# Patient Record
Sex: Male | Born: 1980 | Race: White | Hispanic: No | Marital: Single | Smoking: Never smoker
Health system: Southern US, Academic
[De-identification: ages and names within clinical notes are randomized; demographics above are authoritative.]

## PROBLEM LIST (undated history)

## (undated) DIAGNOSIS — K5733 Diverticulitis of large intestine without perforation or abscess with bleeding: Secondary | ICD-10-CM

## (undated) DIAGNOSIS — F4310 Post-traumatic stress disorder, unspecified: Secondary | ICD-10-CM

## (undated) DIAGNOSIS — E162 Hypoglycemia, unspecified: Secondary | ICD-10-CM

## (undated) DIAGNOSIS — R945 Abnormal results of liver function studies: Secondary | ICD-10-CM

## (undated) DIAGNOSIS — K219 Gastro-esophageal reflux disease without esophagitis: Secondary | ICD-10-CM

## (undated) DIAGNOSIS — F1010 Alcohol abuse, uncomplicated: Secondary | ICD-10-CM

## (undated) DIAGNOSIS — Z8719 Personal history of other diseases of the digestive system: Secondary | ICD-10-CM

## (undated) DIAGNOSIS — R7989 Other specified abnormal findings of blood chemistry: Secondary | ICD-10-CM

## (undated) HISTORY — PX: TONSILLECTOMY: SUR1361

## (undated) HISTORY — PX: ESOPHAGOGASTRODUODENOSCOPY: SHX1529

---
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
## 2008-03-07 DIAGNOSIS — K5733 Diverticulitis of large intestine without perforation or abscess with bleeding: Secondary | ICD-10-CM

## 2008-03-07 HISTORY — DX: Diverticulitis of large intestine without perforation or abscess with bleeding: K57.33

## 2011-05-03 ENCOUNTER — Emergency Department (HOSPITAL_COMMUNITY)
Admission: EM | Admit: 2011-05-03 | Discharge: 2011-05-04 | Disposition: A | Discharge status: Home / Self Care | Payer: Self-pay | Attending: Emergency Medicine | Admitting: Emergency Medicine

## 2011-05-03 ENCOUNTER — Encounter (HOSPITAL_COMMUNITY): Payer: Self-pay | Admitting: *Deleted

## 2011-05-03 DIAGNOSIS — E872 Acidosis, unspecified: Secondary | ICD-10-CM | POA: Insufficient documentation

## 2011-05-03 DIAGNOSIS — R7989 Other specified abnormal findings of blood chemistry: Secondary | ICD-10-CM | POA: Insufficient documentation

## 2011-05-03 DIAGNOSIS — R259 Unspecified abnormal involuntary movements: Secondary | ICD-10-CM | POA: Insufficient documentation

## 2011-05-03 DIAGNOSIS — R110 Nausea: Secondary | ICD-10-CM | POA: Insufficient documentation

## 2011-05-03 DIAGNOSIS — F1010 Alcohol abuse, uncomplicated: Secondary | ICD-10-CM | POA: Insufficient documentation

## 2011-05-03 DIAGNOSIS — K219 Gastro-esophageal reflux disease without esophagitis: Secondary | ICD-10-CM | POA: Insufficient documentation

## 2011-05-03 DIAGNOSIS — R197 Diarrhea, unspecified: Secondary | ICD-10-CM | POA: Insufficient documentation

## 2011-05-03 HISTORY — DX: Gastro-esophageal reflux disease without esophagitis: K21.9

## 2011-05-03 LAB — CBC
Hemoglobin: 13.9 g/dL (ref 13.0–17.0)
MCH: 32.7 pg (ref 26.0–34.0)
MCV: 94.6 fL (ref 78.0–100.0)
RBC: 4.25 MIL/uL (ref 4.22–5.81)
WBC: 8.8 10*3/uL (ref 4.0–10.5)

## 2011-05-03 LAB — DIFFERENTIAL
Eosinophils Absolute: 0 10*3/uL (ref 0.0–0.7)
Lymphocytes Relative: 3 % — ABNORMAL LOW (ref 12–46)
Lymphs Abs: 0.3 10*3/uL — ABNORMAL LOW (ref 0.7–4.0)
Monocytes Relative: 7 % (ref 3–12)
Neutrophils Relative %: 90 % — ABNORMAL HIGH (ref 43–77)

## 2011-05-03 LAB — GLUCOSE, CAPILLARY
Glucose-Capillary: 58 mg/dL — ABNORMAL LOW (ref 70–99)
Glucose-Capillary: 69 mg/dL — ABNORMAL LOW (ref 70–99)

## 2011-05-03 LAB — COMPREHENSIVE METABOLIC PANEL
Alkaline Phosphatase: 53 U/L (ref 39–117)
BUN: 11 mg/dL (ref 6–23)
CO2: 17 mEq/L — ABNORMAL LOW (ref 19–32)
GFR calc Af Amer: >90 mL/min (ref >90)
GFR calc non Af Amer: >90 mL/min (ref >90)
Glucose, Bld: 64 mg/dL — ABNORMAL LOW (ref 70–99)
Potassium: 5 mEq/L (ref 3.5–5.1)
Total Bilirubin: 0.8 mg/dL (ref 0.3–1.2)
Total Protein: 7.4 g/dL (ref 6.0–8.3)

## 2011-05-03 MED ORDER — THIAMINE HCL 100 MG/ML IJ SOLN
100.0000 mg | Freq: Every day | INTRAMUSCULAR | Status: DC
  Administered 2011-05-03: 100 mg via INTRAVENOUS
  Filled 2011-05-03: qty 2

## 2011-05-03 MED ORDER — GLUCOSE 40 % PO GEL
ORAL | Status: AC
  Filled 2011-05-03: qty 1

## 2011-05-03 MED ORDER — GLUCOSE 40 % PO GEL
ORAL | Status: AC
  Administered 2011-05-03: 37.5 g
  Filled 2011-05-03: qty 3

## 2011-05-03 MED ORDER — ONDANSETRON HCL 4 MG/2ML IJ SOLN
4.0000 mg | Freq: Once | INTRAMUSCULAR | Status: AC
  Administered 2011-05-03: 4 mg via INTRAVENOUS
  Filled 2011-05-03: qty 2

## 2011-05-03 MED ORDER — GLUCOSE 40 % PO GEL
1.0000 | Freq: Once | ORAL | Status: AC
  Administered 2011-05-03: 37.5 g via ORAL

## 2011-05-03 MED ORDER — ONDANSETRON 8 MG PO TBDP: 8.0000 mg | ORAL_TABLET | Freq: Three times a day (TID) | ORAL | Status: AC | PRN

## 2011-05-03 MED ORDER — SODIUM CHLORIDE 0.9 % IV BOLUS (SEPSIS)
1000.0000 mL | Freq: Once | INTRAVENOUS | Status: AC
  Administered 2011-05-03: 1000 mL via INTRAVENOUS

## 2011-05-03 MED ORDER — LORAZEPAM 1 MG PO TABS
1.0000 mg | ORAL_TABLET | Freq: Once | ORAL | Status: DC
  Filled 2011-05-03: qty 1

## 2011-05-03 NOTE — ED Notes (Signed)
Pt tolerated gingerale and crackers without difficulty.

## 2011-05-03 NOTE — ED Provider Notes (Addendum)
Patient presents with diarrhea and nausea. She's also felt dehydrated and shaky. Patient admits that he does drink alcohol regularly. He has a metabolic acidosis on his chemistry panel.  On exam his abdomen is benign without any tenderness. He does seem slightly tremulous. There is a possibility he could have a component of alcohol withdrawal. Patient be given Ativan and thiamine. Continue with hydration and we will reassess.  Celene Kras, MD 05/03/11 2245  Patient refused Ativan and decided that he wanted to leave.  He has been able to eat crackers and cheese while he's been here in emergent apartment. I suspect he does have a component of alcohol withdrawal. He also appears to have a component of alcoholic ketoacidosis.Marland Kitchen  patient has been given thiamine. We instructed him to return to emergency room tomorrow to be rechecked.  Celene Kras, MD 05/04/11 [phone]

## 2011-05-03 NOTE — ED Notes (Signed)
Pt reports he is feeling better. Bolus complete. Ambulatory to restroom. Noted in no acute distress without acute changes.

## 2011-05-03 NOTE — ED Provider Notes (Signed)
History     CSN: [id number]  Arrival date & time 05/03/11  7829   First MD Initiated Contact with Patient 05/03/11 2052      Chief Complaint  Patient presents with  . Nausea  . Diarrhea  . Weakness    (Consider location/radiation/quality/duration/timing/severity/associated sxs/prior treatment) HPI Comments: Patient reports that approximately 2 weeks ago he had diarrhea for 4-5 days that had resolved.  He denies any blood or mucous in his stool.  He reports that he has had normal bowel movements for the past several days.  Today he began feeling nauseous.  He denies any vomiting.  He denies any abdominal pain.  He reports that due to the nausea he did not eat anything.  He was able to drink water.  Prior to arrival he began feeling shaky and lightheaded.  Initial CBG done in the ED showed a blood sugar of 54.  Patient does not have any prior history of DM.  Patient reports that he drinks approximately 3 twelve ounces of beer/day.  He reports that he has never had symptoms of alcohol withdrawal or DT's.  He reports that he has not had any alcohol at all today.  Later patient stated that he drank approximately seven beers early this morning.  The history is provided by the patient.    Past Medical History  Diagnosis Date  . GERD (gastroesophageal reflux disease)     Past Surgical History  Procedure Date  . Tonsillectomy     No family history on file.  History  Substance Use Topics  . Smoking status: Never Smoker   . Smokeless tobacco: Current User  . Alcohol Use: Yes     two beers or more a day      Review of Systems  Constitutional: Negative for fever and chills.  HENT: Negative for neck pain and neck stiffness.   Eyes: Negative for visual disturbance.  Respiratory: Negative for shortness of breath.   Cardiovascular: Negative for chest pain.  Gastrointestinal: Positive for nausea. Negative for vomiting, abdominal pain, constipation, blood in stool and abdominal  distention.  Genitourinary: Negative for hematuria.  Neurological: Positive for light-headedness. Negative for syncope, numbness and headaches.  Psychiatric/Behavioral: Negative for confusion.    Allergies  Review of patient's allergies indicates no known allergies.  Home Medications   Current Outpatient Rx  Name Route Sig Dispense Refill  . PROMETHAZINE HCL 25 MG PO TABS Oral Take 25 mg by mouth once.    Marland Kitchen RANITIDINE HCL 150 MG PO CAPS Oral Take 150 mg by mouth 2 (two) times daily.    Marland Kitchen ONDANSETRON 8 MG PO TBDP Oral Take 1 tablet (8 mg total) by mouth every 8 (eight) hours as needed for nausea. 20 tablet 0    BP 115/70  Pulse 120  Temp(Src) 98.1 F (36.7 C) (Oral)  Resp 22  SpO2 97%  Physical Exam  Nursing note and vitals reviewed. Constitutional: He is oriented to person, place, and time. He appears well-developed and well-nourished. No distress.  HENT:  Head: Normocephalic and atraumatic.  Mouth/Throat: Oropharynx is clear and moist.  Eyes: EOM are normal.  Neck: Normal range of motion. Neck supple.  Cardiovascular: Normal rate, regular rhythm and normal heart sounds.   Pulmonary/Chest: Effort normal and breath sounds normal. No respiratory distress. He has no wheezes.  Abdominal: Soft. Bowel sounds are normal. He exhibits no distension and no mass. There is no tenderness. There is no rebound and no guarding.  Musculoskeletal: Normal range of  motion.  Neurological: He is alert and oriented to person, place, and time.  Skin: Skin is warm and dry. No rash noted. He is not diaphoretic.  Psychiatric: He has a normal mood and affect.    ED Course  Procedures (including critical care time)  Labs Reviewed  DIFFERENTIAL - Abnormal; Notable for the following:    Neutrophils Relative 90 (*)    Neutro Abs 7.9 (*)    Lymphocytes Relative 3 (*)    Lymphs Abs 0.3 (*)    All other components within normal limits  COMPREHENSIVE METABOLIC PANEL - Abnormal; Notable for the  following:    Chloride 95 (*)    CO2 17 (*)    Glucose, Bld 64 (*)    AST 150 (*)    ALT 92 (*)    All other components within normal limits  GLUCOSE, CAPILLARY - Abnormal; Notable for the following:    Glucose-Capillary 58 (*)    All other components within normal limits  GLUCOSE, CAPILLARY - Abnormal; Notable for the following:    Glucose-Capillary 63 (*)    All other components within normal limits  GLUCOSE, CAPILLARY - Abnormal; Notable for the following:    Glucose-Capillary 69 (*)    All other components within normal limits  ETHANOL - Abnormal; Notable for the following:    Alcohol, Ethyl (B) 60 (*)    All other components within normal limits  CBC  LIPASE, BLOOD   No results found.   1. Nausea   2. Diarrhea   3. Alcohol abuse     11:53 PM Patient able to tolerate po liquids and food.  Due to patient's history of drinking alcohol thiamine was ordered along with ativan to prevent patient from going into withdrawal.  Patient given 2L IVF.  Patient refused Ativan.  Patient demonstrating some tremors at this time.    MDM  Patient's lab work reveals metabolic acidosis.  Patient has elevated LFT's.  Labwork otherwise unremarkable.  Lipase WNL.  History of alcoholism.   Patient able to tolerate eating and drinking po liquids.  Therefore, feel that patient can be discharged home.        Pascal Lux [place], PA-C 05/04/11 0001

## 2011-05-03 NOTE — ED Notes (Signed)
Dr Lynelle Doctor aware of pt fsbs 69. No further orders received.

## 2011-05-03 NOTE — ED Notes (Signed)
Patient with reported 2 weeks of diarrhea.  He states he has been nauseated as well.  Patient feels dehydrated and shaky.  He is concerned that his sugar may be low.  Patient is pale in color and mucous membranes are dry

## 2011-05-03 NOTE — ED Notes (Signed)
Pt. Requesting soda. Pt. Given drink w/ ice. No other needs voiced.

## 2011-05-03 NOTE — ED Notes (Signed)
PA aware of patient refusal of ativan.

## 2011-05-03 NOTE — ED Notes (Signed)
Pt provided with gingerale for po trial. No acute changes in pt status. No acute distress. Family at bedside.

## 2011-05-04 NOTE — Discharge Instructions (Signed)
Return to the Emergency Department to have your labs checked.

## 2011-05-13 ENCOUNTER — Encounter (HOSPITAL_COMMUNITY): Payer: Self-pay | Admitting: Emergency Medicine

## 2011-05-13 ENCOUNTER — Inpatient Hospital Stay (HOSPITAL_COMMUNITY)
Admission: EM | Admit: 2011-05-13 | Discharge: 2011-05-16 | DRG: 378 | Disposition: A | Discharge status: Home / Self Care | Payer: Self-pay | Attending: Internal Medicine | Admitting: Internal Medicine

## 2011-05-13 ENCOUNTER — Emergency Department (HOSPITAL_COMMUNITY): Payer: Self-pay

## 2011-05-13 DIAGNOSIS — K7689 Other specified diseases of liver: Secondary | ICD-10-CM | POA: Diagnosis present

## 2011-05-13 DIAGNOSIS — R109 Unspecified abdominal pain: Secondary | ICD-10-CM

## 2011-05-13 DIAGNOSIS — F1010 Alcohol abuse, uncomplicated: Secondary | ICD-10-CM

## 2011-05-13 DIAGNOSIS — K921 Melena: Principal | ICD-10-CM | POA: Diagnosis present

## 2011-05-13 DIAGNOSIS — D62 Acute posthemorrhagic anemia: Secondary | ICD-10-CM | POA: Diagnosis present

## 2011-05-13 DIAGNOSIS — D649 Anemia, unspecified: Secondary | ICD-10-CM

## 2011-05-13 DIAGNOSIS — K219 Gastro-esophageal reflux disease without esophagitis: Secondary | ICD-10-CM | POA: Diagnosis present

## 2011-05-13 DIAGNOSIS — D128 Benign neoplasm of rectum: Secondary | ICD-10-CM | POA: Diagnosis present

## 2011-05-13 DIAGNOSIS — R933 Abnormal findings on diagnostic imaging of other parts of digestive tract: Secondary | ICD-10-CM

## 2011-05-13 DIAGNOSIS — F411 Generalized anxiety disorder: Secondary | ICD-10-CM | POA: Diagnosis present

## 2011-05-13 DIAGNOSIS — D129 Benign neoplasm of anus and anal canal: Secondary | ICD-10-CM | POA: Diagnosis present

## 2011-05-13 DIAGNOSIS — D126 Benign neoplasm of colon, unspecified: Secondary | ICD-10-CM

## 2011-05-13 DIAGNOSIS — K922 Gastrointestinal hemorrhage, unspecified: Secondary | ICD-10-CM

## 2011-05-13 DIAGNOSIS — K7010 Alcoholic hepatitis without ascites: Secondary | ICD-10-CM | POA: Diagnosis present

## 2011-05-13 DIAGNOSIS — F17200 Nicotine dependence, unspecified, uncomplicated: Secondary | ICD-10-CM | POA: Diagnosis present

## 2011-05-13 DIAGNOSIS — K625 Hemorrhage of anus and rectum: Secondary | ICD-10-CM

## 2011-05-13 DIAGNOSIS — K760 Fatty (change of) liver, not elsewhere classified: Secondary | ICD-10-CM

## 2011-05-13 HISTORY — DX: Personal history of other diseases of the digestive system: Z87.19

## 2011-05-13 HISTORY — DX: Diverticulitis of large intestine without perforation or abscess with bleeding: K57.33

## 2011-05-13 LAB — DIFFERENTIAL
Eosinophils Absolute: 0 10*3/uL (ref 0.0–0.7)
Eosinophils Relative: 0 % (ref 0–5)
Lymphocytes Relative: 6 % — ABNORMAL LOW (ref 12–46)
Lymphocytes Relative: 13 % (ref 12–46)
Lymphs Abs: 0.7 10*3/uL (ref 0.7–4.0)
Monocytes Absolute: 1.3 10*3/uL — ABNORMAL HIGH (ref 0.1–1.0)
Monocytes Absolute: 1 10*3/uL (ref 0.1–1.0)
Monocytes Relative: 12 % (ref 3–12)
Monocytes Relative: 13 % — ABNORMAL HIGH (ref 3–12)
Neutro Abs: 5.7 10*3/uL (ref 1.7–7.7)

## 2011-05-13 LAB — URINALYSIS, ROUTINE W REFLEX MICROSCOPIC
Bilirubin Urine: NEGATIVE
Hgb urine dipstick: NEGATIVE
Nitrite: NEGATIVE
Protein, ur: NEGATIVE mg/dL
Specific Gravity, Urine: 1.02 (ref 1.005–1.030)
Urobilinogen, UA: 0.2 mg/dL (ref 0.0–1.0)

## 2011-05-13 LAB — CBC
HCT: 33.5 % — ABNORMAL LOW (ref 39.0–52.0)
HCT: 30 % — ABNORMAL LOW (ref 39.0–52.0)
HCT: 28.3 % — ABNORMAL LOW (ref 39.0–52.0)
HCT: 25.8 % — ABNORMAL LOW (ref 39.0–52.0)
Hemoglobin: 11.7 g/dL — ABNORMAL LOW (ref 13.0–17.0)
Hemoglobin: 10.5 g/dL — ABNORMAL LOW (ref 13.0–17.0)
Hemoglobin: 9.8 g/dL — ABNORMAL LOW (ref 13.0–17.0)
MCH: 33.1 pg (ref 26.0–34.0)
MCH: 33.1 pg (ref 26.0–34.0)
MCH: 33.5 pg (ref 26.0–34.0)
MCHC: 35 g/dL (ref 30.0–36.0)
MCHC: 34.6 g/dL (ref 30.0–36.0)
MCV: 94.9 fL (ref 78.0–100.0)
MCV: 94.9 fL (ref 78.0–100.0)
Platelets: 139 10*3/uL — ABNORMAL LOW (ref 150–400)
RBC: 3.53 MIL/uL — ABNORMAL LOW (ref 4.22–5.81)
RBC: 2.72 MIL/uL — ABNORMAL LOW (ref 4.22–5.81)
RDW: 13.3 % (ref 11.5–15.5)
WBC: 11.5 10*3/uL — ABNORMAL HIGH (ref 4.0–10.5)
WBC: 7.8 10*3/uL (ref 4.0–10.5)

## 2011-05-13 LAB — PREPARE RBC (CROSSMATCH)

## 2011-05-13 LAB — COMPREHENSIVE METABOLIC PANEL
ALT: 155 U/L — ABNORMAL HIGH (ref 0–53)
BUN: 20 mg/dL (ref 6–23)
CO2: 26 mEq/L (ref 19–32)
Calcium: 8.8 mg/dL (ref 8.4–10.5)
GFR calc Af Amer: >90 mL/min (ref >90)
GFR calc non Af Amer: >90 mL/min (ref >90)
Glucose, Bld: 133 mg/dL — ABNORMAL HIGH (ref 70–99)
Total Protein: 6.2 g/dL (ref 6.0–8.3)

## 2011-05-13 LAB — PROTIME-INR
INR: 1.21 (ref 0.00–1.49)
Prothrombin Time: 15.6 seconds — ABNORMAL HIGH (ref 11.6–15.2)

## 2011-05-13 LAB — LIPASE, BLOOD: Lipase: 51 U/L (ref 11–59)

## 2011-05-13 MED ORDER — LORAZEPAM 1 MG PO TABS: 1.0000 mg | ORAL_TABLET | Freq: Four times a day (QID) | ORAL | Status: DC | PRN

## 2011-05-13 MED ORDER — ADULT MULTIVITAMIN W/MINERALS CH
1.0000 | ORAL_TABLET | Freq: Every day | ORAL | Status: DC
  Administered 2011-05-14 – 2011-05-16 (×2): 1 via ORAL
  Filled 2011-05-13 (×4): qty 1

## 2011-05-13 MED ORDER — SODIUM CHLORIDE 0.9 % IV BOLUS (SEPSIS)
500.0000 mL | Freq: Once | INTRAVENOUS | Status: AC
  Administered 2011-05-13: 500 mL via INTRAVENOUS

## 2011-05-13 MED ORDER — IOHEXOL 300 MG/ML  SOLN
80.0000 mL | Freq: Once | INTRAMUSCULAR | Status: AC | PRN
  Administered 2011-05-13: 80 mL via INTRAVENOUS

## 2011-05-13 MED ORDER — IOHEXOL 300 MG/ML  SOLN: 20.0000 mL | INTRAMUSCULAR | Status: DC

## 2011-05-13 MED ORDER — VITAMIN B-1 100 MG PO TABS
100.0000 mg | ORAL_TABLET | Freq: Every day | ORAL | Status: DC
  Administered 2011-05-14 – 2011-05-16 (×2): 100 mg via ORAL
  Filled 2011-05-13 (×4): qty 1

## 2011-05-13 MED ORDER — ONDANSETRON HCL 4 MG/2ML IJ SOLN
4.0000 mg | Freq: Four times a day (QID) | INTRAMUSCULAR | Status: DC | PRN
  Administered 2011-05-13: 4 mg via INTRAVENOUS
  Filled 2011-05-13: qty 2

## 2011-05-13 MED ORDER — ALUM & MAG HYDROXIDE-SIMETH 200-200-20 MG/5ML PO SUSP: 30.0000 mL | Freq: Four times a day (QID) | ORAL | Status: DC | PRN

## 2011-05-13 MED ORDER — THIAMINE HCL 100 MG/ML IJ SOLN
100.0000 mg | Freq: Every day | INTRAMUSCULAR | Status: DC
  Administered 2011-05-13: 100 mg via INTRAVENOUS
  Filled 2011-05-13 (×4): qty 1

## 2011-05-13 MED ORDER — ONDANSETRON HCL 4 MG PO TABS: 4.0000 mg | ORAL_TABLET | Freq: Four times a day (QID) | ORAL | Status: DC | PRN

## 2011-05-13 MED ORDER — SODIUM CHLORIDE 0.9 % IV SOLN: INTRAVENOUS | Status: DC

## 2011-05-13 MED ORDER — PANTOPRAZOLE SODIUM 40 MG IV SOLR
40.0000 mg | Freq: Two times a day (BID) | INTRAVENOUS | Status: DC
  Administered 2011-05-13 – 2011-05-14 (×2): 40 mg via INTRAVENOUS
  Filled 2011-05-13 (×4): qty 40

## 2011-05-13 MED ORDER — SODIUM CHLORIDE 0.9 % IV SOLN
INTRAVENOUS | Status: DC
  Administered 2011-05-13: 125 mL/h via INTRAVENOUS
  Administered 2011-05-13: 20:00:00 via INTRAVENOUS
  Administered 2011-05-13: 125 mL/h via INTRAVENOUS
  Administered 2011-05-14 (×2): via INTRAVENOUS

## 2011-05-13 MED ORDER — ONDANSETRON HCL 4 MG/2ML IJ SOLN
4.0000 mg | Freq: Once | INTRAMUSCULAR | Status: AC
  Administered 2011-05-13: 4 mg via INTRAVENOUS
  Filled 2011-05-13: qty 2

## 2011-05-13 MED ORDER — MORPHINE SULFATE 4 MG/ML IJ SOLN
4.0000 mg | Freq: Once | INTRAMUSCULAR | Status: AC
  Administered 2011-05-13: 2 mg via INTRAVENOUS
  Filled 2011-05-13: qty 1

## 2011-05-13 MED ORDER — SODIUM CHLORIDE 0.9 % IV BOLUS (SEPSIS)
1000.0000 mL | Freq: Once | INTRAVENOUS | Status: AC
  Administered 2011-05-13: 1000 mL via INTRAVENOUS

## 2011-05-13 MED ORDER — FOLIC ACID 1 MG PO TABS
1.0000 mg | ORAL_TABLET | Freq: Every day | ORAL | Status: DC
  Administered 2011-05-14 – 2011-05-16 (×2): 1 mg via ORAL
  Filled 2011-05-13 (×4): qty 1

## 2011-05-13 MED ORDER — LORAZEPAM 2 MG/ML IJ SOLN: 1.0000 mg | Freq: Four times a day (QID) | INTRAMUSCULAR | Status: DC | PRN

## 2011-05-13 MED ORDER — SODIUM CHLORIDE 0.9 % IJ SOLN: 3.0000 mL | Freq: Two times a day (BID) | INTRAMUSCULAR | Status: DC

## 2011-05-13 MED ORDER — HYDROMORPHONE HCL PF 1 MG/ML IJ SOLN
1.0000 mg | INTRAMUSCULAR | Status: DC | PRN
  Administered 2011-05-13: 0.5 mg via INTRAVENOUS
  Administered 2011-05-13: 1 mg via INTRAVENOUS
  Filled 2011-05-13 (×2): qty 1

## 2011-05-13 NOTE — ED Notes (Signed)
Felt 'full-constipated-queasy' 1 hour ago.  Went to bathroom and passed 'good' amount bright red blood with clots.  Never had this happen before.

## 2011-05-13 NOTE — ED Notes (Signed)
Report given to [facility] RN and care handed off.

## 2011-05-13 NOTE — ED Provider Notes (Signed)
12:35 Dr Marina Goodell, states to go ahead and get the CT scan, and he can consult after medicine admits  14:34 Dr Dorthula Rue, [facility] will come see patient shortly for admission.   Pt relates 8 normally last night. He states later in the evening he started feeling full and he again clammy and sweaty. He states he went to the bathroom and thought he was having diarrhea however when he looked he would had been passing blood. He states he has started getting lower abdominal pain in the ER he states it was diffusely across his lower abdomen now it's in the suprapubic area. Patient is very concerned that he may need surgery. Patient also concerned that he may go through alcohol withdrawal. He states he drinks 3-712 ounces of beer a day. He states he's never had withdrawal symptoms.  Patient's noted to be extremely pale in color, his palms are pale. His abdomen is soft with some mild tenderness to palpation in the lower abdomen. There is no guarding or rebound.  Pt's hemoglobin noted to be dropping, from 12.7 at end of February to 11.7, then 10.5 and finally 9.8.  Hold clot drawn.    Results for orders placed during the hospital encounter of 05/13/11  CBC      Component Value Range   WBC 11.5 (*) 4.0 - 10.5 (K/uL)   RBC 3.53 (*) 4.22 - 5.81 (MIL/uL)   Hemoglobin 11.7 (*) 13.0 - 17.0 (g/dL)   HCT 16.1 (*) 09.6 - 52.0 (%)   MCV 94.9  78.0 - 100.0 (fL)   MCH 33.1  26.0 - 34.0 (pg)   MCHC 34.9  30.0 - 36.0 (g/dL)   RDW 04.5  40.9 - 81.1 (%)   Platelets 183  150 - 400 (K/uL)  DIFFERENTIAL  COMPREHENSIVE METABOLIC PANEL      Component Value Range   Sodium 137  135 - 145 (mEq/L)   Potassium 4.7  3.5 - 5.1 (mEq/L)   Chloride 100  96 - 112 (mEq/L)   CO2 26  19 - 32 (mEq/L)   Glucose, Bld 133 (*) 70 - 99 (mg/dL)   BUN 20  6 - 23 (mg/dL)   Creatinine, Ser 9.14  0.50 - 1.35 (mg/dL)   Calcium 8.8  8.4 - 78.2 (mg/dL)   Total Protein 6.2  6.0 - 8.3 (g/dL)   Albumin 3.9  3.5 - 5.2 (g/dL)   AST 956 (*) 0 - 37 (U/L)     ALT 155 (*) 0 - 53 (U/L)   Alkaline Phosphatase 46  39 - 117 (U/L)   Total Bilirubin 1.2  0.3 - 1.2 (mg/dL)   GFR calc non Af Amer >90  >90 (mL/min)   GFR calc Af Amer >90  >90 (mL/min)  PROTIME-INR      Component Value Range   Prothrombin Time 15.6 (*) 11.6 - 15.2 (seconds)   INR 1.21  0.00 - 1.49   CBC      Component Value Range   WBC 8.1  4.0 - 10.5 (K/uL)   RBC 3.17 (*) 4.22 - 5.81 (MIL/uL)   Hemoglobin 10.5 (*) 13.0 - 17.0 (g/dL)   HCT 21.3 (*) 08.6 - 52.0 (%)   MCV 94.6  78.0 - 100.0 (fL)   MCH 33.1  26.0 - 34.0 (pg)   MCHC 35.0  30.0 - 36.0 (g/dL)   RDW 57.8  46.9 - 62.9 (%)   Platelets 141 (*) 150 - 400 (K/uL)  CBC      Component Value Range  WBC 7.8  4.0 - 10.5 (K/uL)   RBC 3.02 (*) 4.22 - 5.81 (MIL/uL)   Hemoglobin 9.8 (*) 13.0 - 17.0 (g/dL)   HCT 08.6 (*) 57.8 - 52.0 (%)   MCV 93.7  78.0 - 100.0 (fL)   MCH 32.5  26.0 - 34.0 (pg)   MCHC 34.6  30.0 - 36.0 (g/dL)   RDW 46.9  62.9 - 52.8 (%)   Platelets 137 (*) 150 - 400 (K/uL)  SAMPLE TO BLOOD BANK      Component Value Range   Blood Bank Specimen SAMPLE AVAILABLE FOR TESTING     Sample Expiration 05/14/2011     Ct Abdomen Pelvis W Contrast  05/13/2011  *RADIOLOGY REPORT*  Clinical Data: Bright red blood per rectum with clots.  CT ABDOMEN AND PELVIS WITH CONTRAST  Technique:  Multidetector CT imaging of the abdomen and pelvis was performed following the standard protocol during bolus administration of intravenous contrast.  Contrast: 80mL OMNIPAQUE IOHEXOL 300 MG/ML IJ SOLN  Comparison: None.  Findings: The lung bases are clear.  No pleural or pericardial effusion.  The liver is diffusely low attenuating consistent with fatty infiltration.  There is some mixed attenuation material within the gallbladder which may be due to sludge.  No pericholecystic fluid or gallbladder wall thickening is identified.  The biliary tree, adrenal glands, spleen, pancreas and kidneys appear normal.  There is mild gaseous distention of the  colon. The colon is otherwise normal in appearance.  The appendix is filled contrast and appears normal.  The terminal ileum is decompressed but there is no wall thickening present.  Small bowel is otherwise unremarkable.  There is no lymphadenopathy or fluid.  No focal bony abnormality.  IMPRESSION:  1.  Mild gaseous distention of the colon.  No focal abnormality is identified. 2.  Fatty infiltration of the liver. 3.  Possible gallbladder sludge.  No evidence of cholecystitis by CT.  Original Report Authenticated By: Bernadene Bell. Maricela Curet, M.D.       Ward Givens, MD 05/13/11 1440

## 2011-05-13 NOTE — Consult Note (Signed)
[facility] Consult: 4:06 PM 05/13/2011   Referring Provider:Stewart Aundria Rud, MD and Sarajane Marek, medical student from internal medicine.  Dr. Effie Shy from ED  Primary Care Physician:  Does not have a primary care physician. Uses the ER for primary care Primary Gastroenterologist:  Gentry Fitz, does not have previous contact with GI medicine  Reason for Consultation:  Hematochezia  HPI: Samuel [name] is a 31 y.o. male.  Patient is being admitted to the medical teaching service for acute onset hematochezia. Early this morning patient developed sudden onset of large-volume hematochezia.  He has had about 5 episodes since it began at 3 AM today. Initially he was not having any abdominal pain, he was just passing bright red blood in large-volume which filled the commode water with blood.  This afternoon he has developed some sharp, cramping lower abdominal pain. He did experience some presyncopal episodes at home but never passed out. His heart was racing at times but he was also feeling very panicy and thought that maybe his heart was racing because he was extremely anxious.  Patient has a history of minor rectal bleeding. Sometimes after passing a hard stool and straining, he will white small amounts of blood from the rectum and he may see a few drops of blood within the commode water. This is not accelerated recently. He also has a long history since childhood of gastric distress. As a child of about 21 he recalls having been seen by specialists and undergoing endoscopy for what sounds like difficulty feeding, intolerance to various types of food. As an adult he has heartburn and regurgitation which he treats over-the-counter Zantac as needed. He takes this several times a week. He also has a lot of flatus. He doesn't get nauseous but he does have some queasiness at times. Patient does not use aspirin products or nonsteroidal products. He admits to drinking 2-9 beers  daily. He admits to trying to cut down on his beer consumption. He has never been to alcohol rehabilitation. People have also told him that he drinks too much. Patient doesn't smoke but he does chew tobacco.  Patient was in the emergency room on February 26, 4 complaint of nausea, diarrhea and weakness according to the emergency room physician's note. However the patient says he was seen because of hypo-glycemia. The note also mentions him having drank 7 beers earlier in the morning on the 26th.and he had not eaten any thing in terms of real food for several hours.  His glucose was low at 64 and his AST/ALT were 150/92.  Today his transaminases measure 176/155. Total bilirubin, alkaline phosphatase, lipase are all normal  hemoglobin on February 26th measured 13.9.  When he first had the ER today it measured 11.7,  it has dropped down to 9.8 this afternoon. His platelets are 137. His white count is normal. His BUN is normal.  PT minimally elevated at 15.6 but normal INR at 1.2. CT scan of the abdomen and pelvis has been obtained and reveals mild gaseous distention of the colon, fatty liver and possibly gallbladder sludge as there is some mixed attenuation material within the gallbladder. The gallbladder itself is benign there is no gallbladder wall thickening or pericholecystic fluid. Biliary tree not accentuated.  Past Medical History  Diagnosis Date  . GERD (gastroesophageal reflux disease)     Past Surgical History  Procedure Date  . Tonsillectomy     Prior to Admission medications   Medication Sig Start Date End Date Taking? Authorizing Provider  ranitidine (  ZANTAC) 150 MG capsule Take 150 mg by mouth 2 (two) times daily.   Yes Historical Provider, MD    Scheduled Meds:    . iohexol  20 mL Oral Q1 Hr x 2  .  morphine injection  4 mg Intravenous Once  . ondansetron (ZOFRAN) IV  4 mg Intravenous Once  . sodium chloride  1,000 mL Intravenous Once  . sodium chloride  500 mL Intravenous  Once   Infusions:    . sodium chloride 125 mL/hr (05/13/11 1557)   PRN Meds: HYDROmorphone, iohexol, ondansetron (ZOFRAN) IV, ondansetron   Allergies as of 05/13/2011  . (No Known Allergies)    History reviewed. No pertinent family history.  History   Social History  . Marital Status: Single    Spouse Name: N/A    Number of Children: N/A  . Years of Education: N/A   Occupational History  . Not on file.   Social History Main Topics  . Smoking status: Never Smoker   . Smokeless tobacco: Current User  . Alcohol Use: Yes     two beers couple times/week  . Drug Use: No  . Sexually Active: Yes   Other Topics Concern  . Not on file   Social History Narrative  . No narrative on file    REVIEW OF SYSTEMS: Constitutional:  Patient says his weight fluctuates within about 5 or 10 pounds. His girlfriend says that he's put on 15 pounds since he moved to [facility] and she's been feeding him but the patient thinks he's lost some of that weight.  ENT:  No nosebleeds Pulm:  No shortness of breath or cough CV:  No palpitations, did have some tachycardia last night but at that time he was feeling very panicy. Because of the GI bleeding and he thinks the tachycardia was coming from his anxiety GU:  No dysuria, hematuria or frequency GI:  No dysphagia see HPI Heme:  No history of anemia.    Transfusions:  No history of transfusion Neuro:  No headaches, did feel dizzy and presyncopal on a few occasions since the bleeding started Derm:  No rash, sores or itching Endocrine:  Says he has a long history of "hypoglycemia" this causes weakness, nausea and sometimes diarrhea Immunization:  No flu shot Travel:  None outside the country or beyond the local region   PHYSICAL EXAM: Vital signs in last 24 hours: Temp:  [97.8 F (36.6 C)] 97.8 F (36.6 C) (03/08 0434) Pulse Rate:  [75-128] 106  (03/08 1558) Resp:  [16-20] 18  (03/08 1558) BP: (104-135)/(66-84) 121/78 mmHg (03/08  1558) SpO2:  [98 %-100 %] 99 % (03/08 1558) Weight:  [125 lb (56.7 kg)] 125 lb (56.7 kg) (03/08 0431)  General: pale, thin, generally unwell looking white male. He appears his stated age Head:  Normocephalic, atraumatic  Eyes:  No scleral icterus or pallor of the conjunctiva Ears:  No hearing deficits grossly  Nose:  No discharge or nasal congestion Mouth:  Dentition are discolored and in general poor repair. Neck:  No JVD, bruits or masses.  no thyromegaly Lungs:  Clear to auscultation and percussion bilaterally. No respiratory distress or cough Heart: tachycardic, regular rhythm. No murmurs, rubs, gallops Abdomen:  Within, soft, active bowel sounds. No masses, no hepatosplenomegaly. No bruits or hernias..   Rectal: grossly bloody contents within the rectum noted by emergency room provider. I did not repeat digital exam.  On my visual inspection of the rectum I did not see any tears, hemorrhoids  or rectal ulcers.   Musc/Skeltl: no joint deformities or swelling Extremities:  No pedal or ankle edema.  Neurologic:  Alert and oriented x3. Moves all 4 limbs easily. No tremor Skin:  Pale, no lesions or rash. Tattoos:  Seen on upper extremity was a small tattoo on the left hand Nodes:  No adenopathy at the neck or groin.   Psych:  Pleasant, affect and blunted. Patient is currently not agitated he does not appear acutely anxious  Intake/Output from previous day:   Intake/Output this shift:    LAB RESULTS:  Basename 05/13/11 1345 05/13/11 0825 05/13/11 0520  WBC 7.8 8.1 11.5*  HGB 9.8* 10.5* 11.7*  HCT 28.3* 30.0* 33.5*  PLT 137* 141* 183   BMET Lab Results  Component Value Date   NA 137 05/13/2011   NA 139 05/03/2011   K 4.7 05/13/2011   K 5.0 05/03/2011   CL 100 05/13/2011   CL 95* 05/03/2011   CO2 26 05/13/2011   CO2 17* 05/03/2011   GLUCOSE 133* 05/13/2011   GLUCOSE 64* 05/03/2011   BUN 20 05/13/2011   BUN 11 05/03/2011   CREATININE 0.69 05/13/2011   CREATININE 0.77 05/03/2011   CALCIUM  8.8 05/13/2011   CALCIUM 9.7 05/03/2011   LFT  Basename 05/13/11 0520  PROT 6.2  ALBUMIN 3.9  AST 176*  ALT 155*  ALKPHOS 46  BILITOT 1.2  BILIDIR --  IBILI --   PT/INR Lab Results  Component Value Date   INR 1.21 05/13/2011   Hepatitis Panel No results found for this basename: HEPBSAG,HCVAB,HEPAIGM,HEPBIGM in the last 72 hours C-Diff No components found with this basename: cdiff    Drugs of Abuse  No results found for this basename: labopia, cocainscrnur, labbenz, amphetmu, thcu, labbarb     RADIOLOGY STUDIES: Ct Abdomen Pelvis W Contrast  05/13/2011  *RADIOLOGY REPORT*  Clinical Data: Bright red blood per rectum with clots.  CT ABDOMEN AND PELVIS WITH CONTRAST  Technique:  Multidetector CT imaging of the abdomen and pelvis was performed following the standard protocol during bolus administration of intravenous contrast.  Contrast: 80mL OMNIPAQUE IOHEXOL 300 MG/ML IJ SOLN  Comparison: None.  Findings: The lung bases are clear.  No pleural or pericardial effusion.  The liver is diffusely low attenuating consistent with fatty infiltration.  There is some mixed attenuation material within the gallbladder which may be due to sludge.  No pericholecystic fluid or gallbladder wall thickening is identified.  The biliary tree, adrenal glands, spleen, pancreas and kidneys appear normal.  There is mild gaseous distention of the colon. The colon is otherwise normal in appearance.  The appendix is filled contrast and appears normal.  The terminal ileum is decompressed but there is no wall thickening present.  Small bowel is otherwise unremarkable.  There is no lymphadenopathy or fluid.  No focal bony abnormality.  IMPRESSION:  1.  Mild gaseous distention of the colon.  No focal abnormality is identified. 2.  Fatty infiltration of the liver. 3.  Possible gallbladder sludge.  No evidence of cholecystitis by CT.  Original Report Authenticated By: Bernadene Bell. D'ALESSIO, M.D.    ENDOSCOPIC STUDIES: EGD as  a child, due to feeding difficulties, abdominal pain.  Results not known, ED from the last week report mentions a hiatal hernia.   IMPRESSION: 1.  Acute GI bleed, most likely lower GI source given passing hematochezia, not melena and no vomiting nor rise in BUN. 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 However the CT scan is reassuring in that it does not mention evidence for portal hypertension or cirrhosis, nearly fatty liver. 2. Alcoholic hepatitis 3.  Possible gallbladder sludge 4.  Panic disorder, anxiety 5. GERD, symptomatic since childhood. 6. Acute blood loss anemia 7.  History of hypoglycemia currently his blood sugars are elevated above the normal levels.   PLAN:  1. Supportive care with IV fluid 2.  Serial CBC   LOS: 0 days   Jennye Moccasin  05/13/2011, 4:06 PM Pager: [phone]  GI ATTENDING  HX, LABS, X-RAYS REVIEWED. PATIENT SEEN AND EXAMINED. AGREE WITH ABOVE H&P. GIRL FRIEND IN ROOM. PT WITH ABRUPT ONSET PAINLESS HEMATOCHEZIA. SOME PAIN THEREAFTER. EXAM REVEALS PALENESS, TACHYCARDIA, AND BENIGN ABDOMEN. POSSIBLE CAUSES INCLUDE DIVERTICULAR BLEED (THOUGH YOUNGER THAN IS TYPICAL), AVM, OR DIEULAFOY'S. CT WITH FATTY LIVER, LFT'S (SGOT > SGPT) , AND HX SUGGEST ETOH. PLAN CLEARS, TRANSFUSE, STOP ETOH, AND COLON +/- EGD AT SOME POINT. FOR MORE ACUTE BLEEDING IN THE INTERIM --- TAG CELL SCAN / ANGIOGRAM +/- SURGICAL EVALUATION. THANK YOU  Kolyn Rozario N. Eda Keys., M.D. [facility] Division of Gastroenterology

## 2011-05-13 NOTE — ED Notes (Signed)
Patient requesting pain medication, Dr. Lynelle Doctor notified and will see patient momentarily.

## 2011-05-13 NOTE — ED Notes (Signed)
[id number] Ready 

## 2011-05-13 NOTE — ED Notes (Signed)
Pt had a bowel movement and was noted to be bright blood in the toilet. Pt denies any abdominal pain at present. VSS. Pt is still waiting for CT of the abdomen

## 2011-05-13 NOTE — ED Notes (Signed)
Patient appears pale-multiple bruises-"goats and kids run into me a lot".

## 2011-05-13 NOTE — ED Notes (Signed)
Dr. Lynelle Doctor was made about patient's bloody bowel movement

## 2011-05-13 NOTE — ED Notes (Signed)
Dr. Lynelle Doctor made aware that patient had another bloody bowel movement.

## 2011-05-13 NOTE — ED Provider Notes (Signed)
History     CSN: [id number]  Arrival date & time 05/13/11  0410   First MD Initiated Contact with Patient 05/13/11 0502      Chief Complaint  Patient presents with  . Rectal Bleeding    (Consider location/radiation/quality/duration/timing/severity/associated sxs/prior treatment) Patient is a 31 y.o. male presenting with hematochezia.  Rectal Bleeding  The current episode started today. The onset was sudden. Progression since onset: Single episode. The stool is described as soft. Pertinent negatives include no anorexia.   He had abdominal pain, and bloating, that preceded the rectal bleeding. He passed soft stool and blood together. There were clots present. He has occasionally seen blood with stooling in the past. He denies recent fever, nausea, vomiting. He has never had endoscopy. He had a CT scan of the abdomen one year ago and was told it was normal. He states he drinks 8-12 beers per week. He does not smoke cigarettes. He is not currently employed  Past Medical History  Diagnosis Date  . GERD (gastroesophageal reflux disease)   . H/O hiatal hernia     pt states he had an EGD at age 26 and had a hiatal hernia  . Dysphagia     Past Surgical History  Procedure Date  . Tonsillectomy   . Esophagogastroduodenoscopy     age 74    Family History  Problem Relation Age of Onset  . Diabetes type II Mother   . Emphysema Father   . Heart attack Father   . Stroke Father   . Diabetes type II Other   . Diabetes type II Maternal Aunt     History  Substance Use Topics  . Smoking status: Never Smoker   . Smokeless tobacco: Current User    Types: Chew   Comment: States he has been chewing tobacco almost daily since age 36  . Alcohol Use: Yes     average six 12oz beers/day, ranges from 3-12 12oz/day      Review of Systems  Gastrointestinal: Positive for hematochezia. Negative for anorexia.  All other systems reviewed and are negative.    Allergies  Review of patient's  allergies indicates no known allergies.  Home Medications   Current Outpatient Rx  Name Route Sig Dispense Refill  . RANITIDINE HCL 150 MG PO CAPS Oral Take 150 mg by mouth 2 (two) times daily.      BP 127/76  Pulse 106  Temp(Src) 97.8 F (36.6 C) (Oral)  Resp 12  Ht 5\' 8"  (1.727 m)  Wt 125 lb (56.7 kg)  BMI 19.01 kg/m2  SpO2 98%  Physical Exam  Nursing note and vitals reviewed. Constitutional: He is oriented to person, place, and time. He appears well-developed and well-nourished.  HENT:  Head: Normocephalic and atraumatic.  Right Ear: External ear normal.  Left Ear: External ear normal.  Eyes: Conjunctivae and EOM are normal. Pupils are equal, round, and reactive to light.  Neck: Normal range of motion and phonation normal. Neck supple.  Cardiovascular: Normal rate, regular rhythm, normal heart sounds and intact distal pulses.   Pulmonary/Chest: Effort normal and breath sounds normal. He exhibits no bony tenderness.  Abdominal: Soft. Normal appearance. He exhibits no mass. There is no tenderness. There is no rebound and no guarding.  Genitourinary:       He has blood on his perineum. On rectal exam there is no stool, and there is blood in the rectum with small clots.  Musculoskeletal: Normal range of motion.  Neurological: He is alert  and oriented to person, place, and time. He has normal strength. No cranial nerve deficit or sensory deficit. He exhibits normal muscle tone. Coordination normal.  Skin: Skin is warm, dry and intact.  Psychiatric: His behavior is normal. Judgment and thought content normal.       He is anxious    ED Course  Procedures (including critical care time)  Labs Reviewed  CBC - Abnormal; Notable for the following:    WBC 11.5 (*)    RBC 3.53 (*)    Hemoglobin 11.7 (*)    HCT 33.5 (*)    All other components within normal limits  DIFFERENTIAL - Abnormal; Notable for the following:    Neutrophils Relative 82 (*)    Neutro Abs 9.4 (*)     Lymphocytes Relative 6 (*)    Monocytes Absolute 1.3 (*)    All other components within normal limits  COMPREHENSIVE METABOLIC PANEL - Abnormal; Notable for the following:    Glucose, Bld 133 (*)    AST 176 (*)    ALT 155 (*)    All other components within normal limits  URINALYSIS, ROUTINE W REFLEX MICROSCOPIC - Abnormal; Notable for the following:    Glucose, UA 250 (*)    Ketones, ur 40 (*)    All other components within normal limits  PROTIME-INR - Abnormal; Notable for the following:    Prothrombin Time 15.6 (*)    All other components within normal limits  CBC - Abnormal; Notable for the following:    RBC 3.17 (*)    Hemoglobin 10.5 (*)    HCT 30.0 (*)    Platelets 141 (*)    All other components within normal limits  CBC - Abnormal; Notable for the following:    RBC 3.02 (*)    Hemoglobin 9.8 (*)    HCT 28.3 (*)    Platelets 137 (*)    All other components within normal limits  DIFFERENTIAL - Abnormal; Notable for the following:    Monocytes Relative 13 (*)    All other components within normal limits  LIPASE, BLOOD  SAMPLE TO BLOOD BANK   Ct Abdomen Pelvis W Contrast  05/13/2011  *RADIOLOGY REPORT*  Clinical Data: Bright red blood per rectum with clots.  CT ABDOMEN AND PELVIS WITH CONTRAST  Technique:  Multidetector CT imaging of the abdomen and pelvis was performed following the standard protocol during bolus administration of intravenous contrast.  Contrast: 80mL OMNIPAQUE IOHEXOL 300 MG/ML IJ SOLN  Comparison: None.  Findings: The lung bases are clear.  No pleural or pericardial effusion.  The liver is diffusely low attenuating consistent with fatty infiltration.  There is some mixed attenuation material within the gallbladder which may be due to sludge.  No pericholecystic fluid or gallbladder wall thickening is identified.  The biliary tree, adrenal glands, spleen, pancreas and kidneys appear normal.  There is mild gaseous distention of the colon. The colon is otherwise  normal in appearance.  The appendix is filled contrast and appears normal.  The terminal ileum is decompressed but there is no wall thickening present.  Small bowel is otherwise unremarkable.  There is no lymphadenopathy or fluid.  No focal bony abnormality.  IMPRESSION:  1.  Mild gaseous distention of the colon.  No focal abnormality is identified. 2.  Fatty infiltration of the liver. 3.  Possible gallbladder sludge.  No evidence of cholecystitis by CT.  Original Report Authenticated By: Bernadene Bell. D'ALESSIO, M.D.     1. Bright red  rectal bleeding   2. Anemia   3. Abdominal pain       MDM  Rectal bleeding, lower GI, likely unspecified colitis, possibly infectious. Hb dropping in ED and will need close monitoring. Imaging ordered for structural abnormality.         Flint Melter, MD 05/13/11 [phone]

## 2011-05-13 NOTE — ED Notes (Addendum)
Pt's became tachycardic with HR 10 160's when he stood up to use the urinal. Pt was not c/o dizziness, no chest discomfort at that time but when he was assisted back to bed, the patient c/o SOB. His HR eventually slowed down to 100's, patient claimed that he felt much better. O2 started at 2 LPM/[place]. Family at bedside. Will continue to monitor

## 2011-05-13 NOTE — H&P (Signed)
Internal Medicine Teaching Service Resident Admission Note Date: 05/13/2011  Patient name: Samuel [name] Medical record number: [id number] Date of birth: Feb 10, 1981 Age: 31 y.o. Gender: male PCP: Pcp Not In System  Medical Service: Internal Medicine  I have reviewed the note by Sarajane Marek  MS IV and was present during the interview and physical exam.  Please see below for findings, assessment, and plan.  Chief Complaint: hematochezia  History of Present Illness: 31 year old man from [place] who moved to [place] about 6 months ago with past medical history significant for GERD presents to the ER with lower GI bleed starting the night of his admission. He was in his usual state of health until the supper time on the night of his admission. He states that he felt nauseous, clammy and had the urge to move her bowels- he thought that he had diarrhea but when he looked at his commode, it was full of blood and he decided to come to the ER. He did not have any abdominal pain in the beginning but it started in the ER. He describes his pain as crampy, intermittent discomfort, rates his pain as 8/10 at its worst. He has had 5 episodes in total since last night- out of which 3 were witnessed in the ER. He was constipated for last 1 week.He reports having diarrhea and constipation alternating ly. Of note that he was seen in the ER about 1 week ago for nausea , vomiting and diarrhea. He reports having intermittent episodes of small amounts of blood in his stools intermittently, last episode was about 6-8 months ago that he thought was likely related to hemorrhoids.  He states that he has lost about 5 lbs in last few weeks but his wife states that he been steady on his weight - gained about 15 lbs since he moved to [place].  He also reports having chills, dizziness but denies any hematemesis, chest pain, palpitations, SOB.  He reports hospitalization for abdominal pain about 2-3 years ago when he was  treated with I V antibioticsat [place].   Meds: Medications Prior to Admission  Medication Dose Route Frequency Provider Last Rate Last Dose  . 0.9 %  sodium chloride infusion   Intravenous Continuous Flint Melter, MD 125 mL/hr at 05/13/11 1557 125 mL/hr at 05/13/11 1557  . alum & mag hydroxide-simeth (MAALOX/MYLANTA) 200-200-20 MG/5ML suspension 30 mL  30 mL Oral Q6H PRN Elyse Jarvis, MD      . folic acid (FOLVITE) tablet 1 mg  1 mg Oral Daily Alanson Puls, MD      . HYDROmorphone (DILAUDID) injection 1 mg  1 mg Intravenous Q4H PRN Elyse Jarvis, MD   0.5 mg at 05/13/11 1635  . iohexol (OMNIPAQUE) 300 MG/ML solution 80 mL  80 mL Intravenous Once PRN Medication Radiologist, MD   80 mL at 05/13/11 1259  . LORazepam (ATIVAN) tablet 1 mg  1 mg Oral Q6H PRN Alanson Puls, MD       Or  . LORazepam (ATIVAN) injection 1 mg  1 mg Intravenous Q6H PRN Alanson Puls, MD      . morphine 4 MG/ML injection 4 mg  4 mg Intravenous Once Ward Givens, MD   2 mg at 05/13/11 1420  . mulitivitamin with minerals tablet 1 tablet  1 tablet Oral Daily Alanson Puls, MD      . ondansetron (ZOFRAN) injection 4 mg  4 mg Intravenous Once Ward Givens, MD   4 mg at  05/13/11 1419  . ondansetron (ZOFRAN) tablet 4 mg  4 mg Oral Q6H PRN Elyse Jarvis, MD       Or  . ondansetron (ZOFRAN) injection 4 mg  4 mg Intravenous Q6H PRN Elyse Jarvis, MD   4 mg at 05/13/11 1636  . pantoprazole (PROTONIX) injection 40 mg  40 mg Intravenous Q12H Elyse Jarvis, MD      . sodium chloride 0.9 % bolus 1,000 mL  1,000 mL Intravenous Once Ward Givens, MD   1,000 mL at 05/13/11 1419  . sodium chloride 0.9 % bolus 500 mL  500 mL Intravenous Once Flint Melter, MD   500 mL at 05/13/11 0538  . sodium chloride 0.9 % bolus 500 mL  500 mL Intravenous Once Elyse Jarvis, MD      . sodium chloride 0.9 % injection 3 mL  3 mL Intravenous Q12H Elyse Jarvis, MD      . thiamine (VITAMIN B-1) tablet 100 mg  100 mg Oral Daily Alanson Puls,  MD       Or  . thiamine (B-1) injection 100 mg  100 mg Intravenous Daily Alanson Puls, MD      . DISCONTD: 0.9 %  sodium chloride infusion   Intravenous Continuous Elyse Jarvis, MD      . DISCONTD: iohexol (OMNIPAQUE) 300 MG/ML solution 20 mL  20 mL Oral Q1 Hr x 2 Medication Radiologist, MD       Medications Prior to Admission  Medication Sig Dispense Refill  . ranitidine (ZANTAC) 150 MG capsule Take 150 mg by mouth 2 (two) times daily.        Allergies: Review of patient's allergies indicates no known allergies.  Past Medical History: Medical Student note reviewed  Family History: Medical Student note reviewed  Social History: Medical Student note reviewed  Surgical History: Medical Student note reviewed  Review of System: Medical Student note reviewed  Physical Exam: Blood pressure 122/81, pulse 92, temperature 97.9 F (36.6 C), temperature source Oral, resp. rate 18, height 5\' 9"  (1.753 m), weight 123 lb 7.3 oz (56 kg), SpO2 100.00%. BP 122/81  Pulse 92  Temp(Src) 97.9 F (36.6 C) (Oral)  Resp 18  Ht 5\' 9"  (1.753 m)  Wt 123 lb 7.3 oz (56 kg)  BMI 18.23 kg/m2  SpO2 100%  General Appearance:    Alert, cooperative, no distress, appears stated age  Head:    Normocephalic, without obvious abnormality, atraumatic  Eyes:    PERRL, conjunctiva/corneas clear, EOM's intact, fundi    benign, both eyes       Ears:    Normal TM's and external ear canals, both ears  Nose:   Nares normal, septum midline, mucosa normal, no drainage    or sinus tenderness  Throat:   Lips, mucosa, and tongue normal; teeth and gums normal  Neck:   Supple, symmetrical, trachea midline, no adenopathy;       thyroid:  No enlargement/tenderness/nodules; no carotid   bruit or JVD  Back:     Symmetric, no curvature, ROM normal, no CVA tenderness  Lungs:     Clear to auscultation bilaterally, respirations unlabored  Chest wall:    No tenderness or deformity  Heart:    Regular rate and rhythm, S1  and S2 normal, no murmur, rub   or gallop  Abdomen:     Soft, tender to palpation in the hypogastrium and right lower quadrant, bowel sounds active all four quadrants,    no masses, no  organomegaly  Genitalia:    Normal male without lesion, discharge or tenderness  Rectal:    Normal tone, normal prostate, no masses or tenderness;   guaiac negative stool  Extremities:   Extremities normal, atraumatic, no cyanosis or edema  Pulses:   2+ and symmetric all extremities  Skin:   Skin color, texture, turgor normal, no rashes or lesions  Lymph nodes:   Cervical, supraclavicular, and axillary nodes normal  Neurologic:   CNII-XII intact. Normal strength, sensation and reflexes      throughout    Labs: Reviewed as noted in the Electronic Record  Imaging: Reviewed as noted in the Electronic Record  Assessment & Plan by Problem: Principal Problem:  1.Lower GI bleed:Patient reports having episodes of painless BRBPR associated with abdominal cramps starting on the night of his admission. He was feeling dizzy and lightheaded and was found to be orthostatic. He  had 5 episodes since last night( 3 witnessed in the ER). .Differentials include diverticulosis vs IBD vs PUD vs hemorrhoids.His CT scan shows mild gaseous distension in the colon but no diverticulosis.  Etiology unclear but  diverticulosis is still a possibility based on the history.  His Hb dropped from 11.7 to 9.8. - Admit to telemetry. - Consult GI for colonoscopy. - CBC q8 - Hydrate with IV fluids. - Start PPI - Transfuse if Hb<8.  2. Alcohol Abuse - Mr. Oneil has a history of alcohol abuse for 8-9 years where he states he has averaged 6 12oz beers/day.  He states he had 3 yesterday and has not been drinking more or less than usual.  His MCV is 93.7, AST/ALT elevated at 176 and 155. Tbili is 1.2, alk phos is 46.  PT slightly elevated 15.6, INR 1.21.  Urine has glucose of 250 and Ketones of 40.  - on CIWA protocol  - Will counsel on  alcoholism  - Fatty liver infiltrates on CT scan, but no hepatomegaly appreciated.  Lipase 51.   3. Ketonuria and glucosuria: Differentials include alcohol vs starvation vs new onset diabetes. - check AIC  4. GERD - Has a history of dysphagia and heartburn.  Pt states he takes Zantac 2-4 times daily.  Dysphagia may be due to previously diagnosed hiatal hernia.  - Protonix IV BID  5. VTE - SCDs   Signed: Laurana Magistro 05/13/2011, 6:34 PM Medical Student Hospital Admission Note Date: 05/13/2011  Patient name: Suhan Colina Medical record number: [id number] Date of birth: 11-02-1980 Age: 31 y.o. Gender: male PCP: Pcp Not In System  Medical Service: Internal Medicine Teaching Service, Maurice March (B2)  Attending physician:  Dr. Ulyess Mort  Chief Complaint: Rectal Bleed  History of Present Illness: Mr. Pettet is a pleasant 31 yo white male with a PMH of alcohol abuse who presents with a lower GI bleed for less than 24 hours.  He states that he was in his normal state of health yesterday evening, had a normal supper and two beers, but around 11:30pm he was feeling bloated and clammy.  He began to have an urge to pee and to have a BM and went to the toilet where he states he had a large watery painless BM that provided almost complete relief of his abdominal pain.  His girlfriend noticed that the BM was bloody and they called an ambulance.  Of note, he states that he was very light headed while on the toilet then, and had to hold himself up during his BM.  He states that for the last year  he has noticed a few drops of blood in his bowel movements which have ranged from watery to very hard if constipated prior to BM. He moved from [place] to [facility] September 2012, where he occasionally drinks well water in his home. He describes an inconsistent pattern of diarrhea and constipation for the last year and for the last 2 months he has become more pale, fatigued and lost about 5-6 pounds. He denies fevers but  does endorse having chills and feeling cold.  He states that his flatus has also become rather odorous which has worsened in the last week.  He regularly takes Zantac 2-4 times per day at home for what he calls acid reflux, and has taken 2 yesterday without much relief.  He denies any foreign objects in his rectum, has never had a bloody BM before, no sick contacts.  After having a single bloody BM at home, in the ED he states he has had several bloody BMs. He concurrently has abdominal pain prior to the BM, but has relief in pain after his BM though he states his abdominal pain is now not completely resolved with his bowel movement.  He is very concerned and anxious about what is causing all of this.  Meds: Medications Prior to Admission  Medication Dose Route Frequency Provider Last Rate Last Dose  . 0.9 %  sodium chloride infusion   Intravenous Continuous Flint Melter, MD 125 mL/hr at 05/13/11 1557 125 mL/hr at 05/13/11 1557  . HYDROmorphone (DILAUDID) injection 1 mg  1 mg Intravenous Q4H PRN Elyse Jarvis, MD      . iohexol (OMNIPAQUE) 300 MG/ML solution 20 mL  20 mL Oral Q1 Hr x 2 Medication Radiologist, MD      . iohexol (OMNIPAQUE) 300 MG/ML solution 80 mL  80 mL Intravenous Once PRN Medication Radiologist, MD   80 mL at 05/13/11 1259  . morphine 4 MG/ML injection 4 mg  4 mg Intravenous Once Ward Givens, MD   2 mg at 05/13/11 1420  . ondansetron (ZOFRAN) injection 4 mg  4 mg Intravenous Once Ward Givens, MD   4 mg at 05/13/11 1419  . ondansetron (ZOFRAN) tablet 4 mg  4 mg Oral Q6H PRN Elyse Jarvis, MD       Or  . ondansetron (ZOFRAN) injection 4 mg  4 mg Intravenous Q6H PRN Elyse Jarvis, MD      . sodium chloride 0.9 % bolus 1,000 mL  1,000 mL Intravenous Once Ward Givens, MD   1,000 mL at 05/13/11 1419  . sodium chloride 0.9 % bolus 500 mL  500 mL Intravenous Once Flint Melter, MD   500 mL at 05/13/11 0538   Medications Prior to Admission  Medication Sig Dispense Refill  .  ranitidine (ZANTAC) 150 MG capsule Take 150 mg by mouth 2 (two) times daily.        Allergies: Review of patient's allergies indicates no known allergies. Past Medical History  Diagnosis Date  . GERD (gastroesophageal reflux disease)   . H/O hiatal hernia     pt states he had an EGD at age 77 and had a hiatal hernia  . Dysphagia    Past Surgical History  Procedure Date  . Tonsillectomy   . Esophagogastroduodenoscopy     age 49   Family History  Problem Relation Age of Onset  . Diabetes type II Mother   . Emphysema Father   . Heart attack Father   . Stroke Father   .  Diabetes type II Other   . Diabetes type II Maternal Aunt    History   Social History  . Marital Status: Single    Spouse Name: N/A    Number of Children: N/A  . Years of Education: N/A   Occupational History  . Not on file.   Social History Main Topics  . Smoking status: Never Smoker   . Smokeless tobacco: Current User    Types: Chew  . Alcohol Use: Not on file     average six 12oz beers/day, ranges from 3-12 12oz/day  . Drug Use: Not on file  . Sexually Active: Yes   Other Topics Concern  . Not on file   Social History Narrative  . No narrative on file    Review of Systems: Pertinent items are noted in HPI.  Physical Exam: Blood pressure 121/78, pulse 106, temperature 97.8 F (36.6 C), temperature source Oral, resp. rate 18, height 5\' 8"  (1.727 m), weight 56.7 kg (125 lb), SpO2 99.00%.    Orthostatics:   Lying 117/80, Pulse 75 Sitting 112/66, Pulse 83 Standing 104/77, Pulse 128  General appearance: alert, cooperative, mild distress and pale Head: Normocephalic, without obvious abnormality, atraumatic Throat: abnormal findings: OP clear, unable to protrude tongue, poor dentition Neck: no adenopathy, no carotid bruit, supple, symmetrical, trachea midline and thyroid not enlarged, symmetric, no tenderness/mass/nodules Lungs: clear to auscultation bilaterally Heart: regular rate and  rhythm, S1, S2 normal, no murmur, click, rub or gallop Abdomen: abnormal findings: soft, tender to plapation in the hypogastrium and right lower quadrant  2+ BS in all quadrants, no hepatomegaly noted, diffusely tender in the lower abdomen on palpation, no rebound tenderness or guarding Rectal: Blood on the perineum, no stool but blood in the rectum with small clots (per ED physician) Extremities: extremities normal, atraumatic, no cyanosis or edema Pulses: 2+ and symmetric Skin: Skin color, texture, turgor normal. No rashes or lesions or but with good skin turgor in lower extremities Lymph nodes: no cervical LAD appreciated Neurologic: Grossly normal  Lab results: Basic Metabolic Panel:  Basename 05/13/11 0520  NA 137  K 4.7  CL 100  CO2 26  GLUCOSE 133*  BUN 20  CREATININE 0.69  CALCIUM 8.8  MG --  PHOS --   Liver Function Tests:  [facility] 05/13/11 0520  AST 176*  ALT 155*  ALKPHOS 46  BILITOT 1.2  PROT 6.2  ALBUMIN 3.9    Basename 05/13/11 0520  LIPASE 51  AMYLASE --   No results found for this basename: AMMONIA:2 in the last 72 hours CBC:  Basename 05/13/11 1345 05/13/11 0825 05/13/11 0520  WBC 7.8 8.1 --  NEUTROABS 5.7 -- 9.4*  HGB 9.8* 10.5* --  HCT 28.3* 30.0* --  MCV 93.7 94.6 --  PLT 137* 141* --   Cardiac Enzymes: No results found for this basename: CKTOTAL:3,CKMB:3,CKMBINDEX:3,TROPONINI:3 in the last 72 hours BNP: No results found for this basename: PROBNP:3 in the last 72 hours D-Dimer: No results found for this basename: DDIMER:2 in the last 72 hours CBG: No results found for this basename: GLUCAP:6 in the last 72 hours Hemoglobin A1C: No results found for this basename: HGBA1C in the last 72 hours Fasting Lipid Panel: No results found for this basename: CHOL,HDL,LDLCALC,TRIG,CHOLHDL,LDLDIRECT in the last 72 hours Thyroid Function Tests: No results found for this basename: TSH,T4TOTAL,FREET4,T3FREE,THYROIDAB in the last 72 hours Anemia  Panel: No results found for this basename: VITAMINB12,FOLATE,FERRITIN,TIBC,IRON,RETICCTPCT in the last 72 hours Coagulation:  Basename 05/13/11 0520  LABPROT  15.6*  INR 1.21   Urine Drug Screen:  Alcohol Level: No results found for this basename: ETH:2 in the last 72 hours Urinalysis:  Basename 05/13/11 0512  COLORURINE YELLOW  LABSPEC 1.020  PHURINE 7.0  GLUCOSEU 250*  HGBUR NEGATIVE  BILIRUBINUR NEGATIVE  KETONESUR 40*  PROTEINUR NEGATIVE  UROBILINOGEN 0.2  NITRITE NEGATIVE  LEUKOCYTESUR NEGATIVE   Misc. Labs: None pending  Imaging results:  Ct Abdomen Pelvis W Contrast  05/13/2011  *RADIOLOGY REPORT*  Clinical Data: Bright red blood per rectum with clots.  CT ABDOMEN AND PELVIS WITH CONTRAST  Technique:  Multidetector CT imaging of the abdomen and pelvis was performed following the standard protocol during bolus administration of intravenous contrast.  Contrast: 80mL OMNIPAQUE IOHEXOL 300 MG/ML IJ SOLN  Comparison: None.  Findings: The lung bases are clear.  No pleural or pericardial effusion.  The liver is diffusely low attenuating consistent with fatty infiltration.  There is some mixed attenuation material within the gallbladder which may be due to sludge.  No pericholecystic fluid or gallbladder wall thickening is identified.  The biliary tree, adrenal glands, spleen, pancreas and kidneys appear normal.  There is mild gaseous distention of the colon. The colon is otherwise normal in appearance.  The appendix is filled contrast and appears normal.  The terminal ileum is decompressed but there is no wall thickening present.  Small bowel is otherwise unremarkable.  There is no lymphadenopathy or fluid.  No focal bony abnormality.  IMPRESSION:  1.  Mild gaseous distention of the colon.  No focal abnormality is identified. 2.  Fatty infiltration of the liver. 3.  Possible gallbladder sludge.  No evidence of cholecystitis by CT.  Original Report Authenticated By: Bernadene Bell. Maricela Curet,  M.D.    Other results: None  Assessment & Plan by Problem: Mr. Somerville presents with an active lower GI bleed.  His hemoglobin has been dropping in the ED from 10.5 to 9.8 and he is orthostatic with an increase of pulse from 75 to 128 from lying to standing.  The etiology for the lower GI bleed could include IBS vs. IBD vs. Hemorrhoids vs. Diverticulosis vs. Colon Cancer.   1. Lower GI Bleed - Actively bleeding, but has not been transfused as of yet.  DDx: Hemorrhoids is possible given the patient states that he has had hemorrhoids within the last year and possible liver disease, but on exam none are visible.  He is young and CT scan does not show signs of diverticulosis, but this can not be excluded, especially in the setting of 1 year history of irregular red blood in his stool.  There is no history of colon cancer, but the patient has never had a colonoscopy and has some weight loss recently. More likely is Diverticulosis vs. IBD. Admitting to floor status with tele.  Abdominal CT scan shows fatty liver infiltration  - CBC q8H, type and screen done already, transfuse if Hb<8.  Hb is down to 9.8 from 11.7 initially in the ED, and 13.9 two weeks ago in the ED.  - FOBT positive  - Orthostatic, will continue fluids at 182mL/hr NS  - GI consulted  - Protonix IV 40mg  BID  - Morphine/Zofran prn  - Consider NS bolus should the patient remain orthostatic.   2. Alcohol Abuse - Mr. Easton has a history of alcohol abuse for 8-9 years where he states he has averaged 6 12oz beers/day.  He states he had 3 yesterday and has not been drinking more or less  than usual.  His MCV is 93.7, AST/ALT elevated at 176 and 155. Tbili is 1.2, alk phos is 46.  PT slightly elevated 15.6, INR 1.21.  Urine has glucose of 250 and Ketones of 40.  - on CIWA protocol  - Will counsel on alcoholism  - Fatty liver infiltrates on CT scan, but no hepatomegaly appreciated.  Lipase 51.  - Will obtain an A1c given glucosuria.     3. GERD - Has a history of dysphagia and heartburn.  Pt states he takes Zantac 2-4 times daily.  Dysphagia may be due to previously diagnosed hiatal hernia.  - Protonix IV BID  4. Diet/Fluids - 156mL/hr NS, NPO.  5. VTE - SCDs  6. Disposition - Pending GI consult, will continue hydration and monitor CBCs q8H  This is a Psychologist, occupational Note.  The care of the patient was discussed with Dr. Dorthula Rue and the assessment and plan was formulated with their assistance.  Please see their note for official documentation of the patient encounter.   SignedSarajane Marek Chimanlal 05/13/2011, 4:13 PM

## 2011-05-14 LAB — COMPREHENSIVE METABOLIC PANEL
ALT: 85 U/L — ABNORMAL HIGH (ref 0–53)
Albumin: 3.1 g/dL — ABNORMAL LOW (ref 3.5–5.2)
Alkaline Phosphatase: 38 U/L — ABNORMAL LOW (ref 39–117)
BUN: 6 mg/dL (ref 6–23)
CO2: 26 mEq/L (ref 19–32)
Calcium: 8.3 mg/dL — ABNORMAL LOW (ref 8.4–10.5)
Creatinine, Ser: 0.7 mg/dL (ref 0.50–1.35)
Glucose, Bld: 78 mg/dL (ref 70–99)
Total Protein: 4.8 g/dL — ABNORMAL LOW (ref 6.0–8.3)

## 2011-05-14 LAB — CBC
HCT: 31.7 % — ABNORMAL LOW (ref 39.0–52.0)
Hemoglobin: 10.8 g/dL — ABNORMAL LOW (ref 13.0–17.0)
MCH: 32 pg (ref 26.0–34.0)
MCH: 32 pg (ref 26.0–34.0)
MCHC: 35 g/dL (ref 30.0–36.0)
MCHC: 35.1 g/dL (ref 30.0–36.0)
MCV: 91.4 fL (ref 78.0–100.0)
Platelets: 123 10*3/uL — ABNORMAL LOW (ref 150–400)
RBC: 3.47 MIL/uL — ABNORMAL LOW (ref 4.22–5.81)
RDW: 14.9 % (ref 11.5–15.5)
RDW: 15.2 % (ref 11.5–15.5)

## 2011-05-14 LAB — HIV ANTIBODY (ROUTINE TESTING W REFLEX): HIV: NONREACTIVE

## 2011-05-14 MED ORDER — PEG-KCL-NACL-NASULF-NA ASC-C 100 G PO SOLR
1.0000 | Freq: Once | ORAL | Status: AC
  Administered 2011-05-14: 100 g via ORAL
  Filled 2011-05-14 (×2): qty 1

## 2011-05-14 MED ORDER — PANTOPRAZOLE SODIUM 40 MG PO TBEC
40.0000 mg | DELAYED_RELEASE_TABLET | Freq: Every day | ORAL | Status: DC
  Administered 2011-05-14: 40 mg via ORAL
  Filled 2011-05-14: qty 1

## 2011-05-14 NOTE — Progress Notes (Signed)
Resident Co-sign Daily Note: I have seen the patient and reviewed the daily progress note by Samuel Patel  MS IVand discussed the care of the patient with them.  See below for documentation of my findings, assessment, and plans.  Subjective: He reports feeling better- states that his abdominal cramps have resolved. Last episode of bloody bowel movement was last night. Objective: Vital signs in last 24 hours: Filed Vitals:   05/14/11 0038 05/14/11 0138 05/14/11 0230 05/14/11 0626  BP: 113/67 105/65 101/63 104/66  Pulse: 89 78 79 80  Temp: 99.1 F (37.3 C) 98.6 F (37 C) 98.5 F (36.9 C) 98.3 F (36.8 C)  TempSrc: Oral Oral Oral Oral  Resp: 20 18 20 20   Height:      Weight:      SpO2: 100% 100% 100% 99%   Physical Exam: BP 104/66  Pulse 80  Temp(Src) 98.3 F (36.8 C) (Oral)  Resp 20  Ht 5\' 9"  (1.753 m)  Wt 123 lb 7.3 oz (56 kg)  BMI 18.23 kg/m2  SpO2 99%  General Appearance:    Alert, cooperative, no distress, appears stated age  Head:    Normocephalic, without obvious abnormality, atraumatic  Eyes:    PERRL, conjunctiva/corneas clear, EOM's intact, fundi    benign, both eyes       Ears:    Normal TM's and external ear canals, both ears  Nose:   Nares normal, septum midline, mucosa normal, no drainage    or sinus tenderness  Throat:   Lips, mucosa, and tongue normal; teeth and gums normal  Neck:   Supple, symmetrical, trachea midline, no adenopathy;       thyroid:  No enlargement/tenderness/nodules; no carotid   bruit or JVD  Back:     Symmetric, no curvature, ROM normal, no CVA tenderness  Lungs:     Clear to auscultation bilaterally, respirations unlabored  Chest wall:    No tenderness or deformity  Heart:    Regular rate and rhythm, S1 and S2 normal, no murmur, rub   or gallop  Abdomen:     Soft, non tender, bowel sounds active all four quadrants,    no masses,  Genitalia:    Normal male without lesion, discharge or tenderness  Rectal:    Normal tone, normal  prostate, no masses or tenderness;   guaiac negative stool  Extremities:   Extremities normal, atraumatic, no cyanosis or edema  Pulses:   2+ and symmetric all extremities  Skin:   Skin color, texture, turgor normal, no rashes or lesions  Lymph nodes:   Cervical, supraclavicular, and axillary nodes normal  Neurologic:   CNII-XII intact. Normal strength, sensation and reflexes      throughout   Lab Results: Reviewed and documented in Electronic Record Micro Results: Reviewed and documented in Electronic Record Studies/Results: Reviewed and documented in Electronic Record Medications: I have reviewed the patient's current medications. Scheduled Meds:   . folic acid  1 mg Oral Daily  .  morphine injection  4 mg Intravenous Once  . mulitivitamin with minerals  1 tablet Oral Daily  . ondansetron (ZOFRAN) IV  4 mg Intravenous Once  . pantoprazole  40 mg Oral Daily  . sodium chloride  1,000 mL Intravenous Once  . sodium chloride  500 mL Intravenous Once  . thiamine  100 mg Oral Daily   Or  . thiamine  100 mg Intravenous Daily  . DISCONTD: iohexol  20 mL Oral Q1 Hr x 2  . DISCONTD:  pantoprazole (PROTONIX) IV  40 mg Intravenous Q12H  . DISCONTD: sodium chloride  3 mL Intravenous Q12H   Continuous Infusions:   . sodium chloride 75 mL/hr at 05/14/11 0902  . DISCONTD: sodium chloride     PRN Meds:.alum & mag hydroxide-simeth, HYDROmorphone, iohexol, LORazepam, LORazepam, ondansetron (ZOFRAN) IV, ondansetron Assessment/Plan:  1.Lower GI bleed:Patient presented with  episodes of painless BRBPR associated with abdominal cramps starting on the night of his admission. Last episode was last night. Denies any dizziness. Appreciate Dr. Marina Goodell seeing the patient. In the setting of active GI bleed, he was transfused 2 units of blood last night. Etiology unclear so far but differentials include diverticulosis ( atypical given his age) vs IBD vs PUD vs AVM's vs dieulafoy s lesion.His CT scan shows  mild gaseous distension in the colon . H and H stable.  - Appreciate Gi inputs! - CBC q12. - Hydrate with IV fluids- decrease rate to 75 cc/hr. - Transfuse if Hb<8.  - Check HIV  2. Alcohol Abuse - - Fatty liver infiltrates on CT scan, but no hepatomegaly appreciated. Lipase 51.  - on CIWA protocol  - Counseled on cessation  3. GERD: Protonix daily.  4. Dispo; Pending further GI work up.      LOS: 1 day   Samuel [name] 05/14/2011, 11:15 AM  Medical Student Daily Progress Note  Subjective: After being admitted to the floor, Samuel [name] received 2 unit pRBCs at the discretion of Dr. Marina Goodell, GI consultant.  The patient states that he is feeling much better than yesterday with 0/10 pain.  He had a bowel movement yesterday evening at 10:30 without hematochezia.  He denies CP, SOB, abdominal pain, fevers, chills, diaphoresis or lightheadedness since transfusion.  His mother, father and girlfriend are at bedside with the patient.  Hb is 11.1 this AM.  Objective: Vital signs in last 24 hours: Filed Vitals:   05/14/11 0038 05/14/11 0138 05/14/11 0230 05/14/11 0626  BP: 113/67 105/65 101/63 104/66  Pulse: 89 78 79 80  Temp: 99.1 F (37.3 C) 98.6 F (37 C) 98.5 F (36.9 C) 98.3 F (36.8 C)  TempSrc: Oral Oral Oral Oral  Resp: 20 18 20 20   Height:      Weight:      SpO2: 100% 100% 100% 99%   Weight change: -0.7 kg (-1 lb 8.7 oz)  Intake/Output Summary (Last 24 hours) at 05/14/11 0941 Last data filed at 05/14/11 0900  Gross per 24 hour  Intake 1538.33 ml  Output   2325 ml  Net -786.67 ml   Physical Exam:  General appearance: alert, cooperative, mild distress and pale  Head: Normocephalic, without obvious abnormality, atraumatic  Throat: abnormal findings: OP clear, unable to protrude tongue, poor dentition  Neck: no adenopathy, no carotid bruit, supple, symmetrical, trachea midline and thyroid not enlarged, symmetric, no tenderness/mass/nodules  Lungs: clear to  auscultation bilaterally  Heart: regular rate and rhythm, S1, S2 normal, no murmur, click, rub or gallop  Abdomen: abnormal findings: soft, mildly tender to palpation in all quadrants, 2+ BS in all quadrants, hepatomegaly of three finger lengths below coastal margin noted, diffusely tender in the lower abdomen on palpation, no rebound tenderness or guarding  Rectal: deferred Extremities: extremities normal, atraumatic, no cyanosis or edema  Pulses: 2+ and symmetric  Skin: Skin color, texture, turgor normal. No rashes or lesions or but with good skin turgor in lower extremities  Lymph nodes: no cervical LAD appreciated  Neurologic: Grossly normal  Lab Results: Basic Metabolic  Panel:  Lab 05/14/11 0615 05/13/11 0520  NA 139 137  K 4.5 4.7  CL 104 100  CO2 26 26  GLUCOSE 78 133*  BUN 6 20  CREATININE 0.70 0.69  CALCIUM 8.3* 8.8  MG -- --  PHOS -- --   Liver Function Tests:  Lab 05/14/11 0615 05/13/11 0520  AST 72* 176*  ALT 85* 155*  ALKPHOS 38* 46  BILITOT 1.8* 1.2  PROT 4.8* 6.2  ALBUMIN 3.1* 3.9    Lab 05/13/11 0520  LIPASE 51  AMYLASE --   No results found for this basename: AMMONIA:2 in the last 168 hours CBC:  Lab 05/14/11 0615 05/14/11 0500 05/13/11 1345 05/13/11 0520  WBC 6.1 6.1 -- --  NEUTROABS -- -- 5.7 9.4*  HGB 11.1* 11.1* -- --  HCT 31.7* 31.7* -- --  MCV 91.4 91.4 -- --  PLT 124* 123* -- --   Hemoglobin A1C:  Lab 05/13/11 1939  HGBA1C 5.6   Coagulation:  Lab 05/13/11 0520  LABPROT 15.6*  INR 1.21   Urinalysis:  Lab 05/13/11 0512  COLORURINE YELLOW  LABSPEC 1.020  PHURINE 7.0  GLUCOSEU 250*  HGBUR NEGATIVE  BILIRUBINUR NEGATIVE  KETONESUR 40*  PROTEINUR NEGATIVE  UROBILINOGEN 0.2  NITRITE NEGATIVE  LEUKOCYTESUR NEGATIVE   Misc. Labs: None  Studies/Results: Ct Abdomen Pelvis W Contrast  05/13/2011  *RADIOLOGY REPORT*  Clinical Data: Bright red blood per rectum with clots.  CT ABDOMEN AND PELVIS WITH CONTRAST  Technique:   Multidetector CT imaging of the abdomen and pelvis was performed following the standard protocol during bolus administration of intravenous contrast.  Contrast: 80mL OMNIPAQUE IOHEXOL 300 MG/ML IJ SOLN  Comparison: None.  Findings: The lung bases are clear.  No pleural or pericardial effusion.  The liver is diffusely low attenuating consistent with fatty infiltration.  There is some mixed attenuation material within the gallbladder which may be due to sludge.  No pericholecystic fluid or gallbladder wall thickening is identified.  The biliary tree, adrenal glands, spleen, pancreas and kidneys appear normal.  There is mild gaseous distention of the colon. The colon is otherwise normal in appearance.  The appendix is filled contrast and appears normal.  The terminal ileum is decompressed but there is no wall thickening present.  Small bowel is otherwise unremarkable.  There is no lymphadenopathy or fluid.  No focal bony abnormality.  IMPRESSION:  1.  Mild gaseous distention of the colon.  No focal abnormality is identified. 2.  Fatty infiltration of the liver. 3.  Possible gallbladder sludge.  No evidence of cholecystitis by CT.  Original Report Authenticated By: Bernadene Bell. Maricela Curet, M.D.   Medications: I have reviewed the patient's current medications. Scheduled Meds:   . folic acid  1 mg Oral Daily  .  morphine injection  4 mg Intravenous Once  . mulitivitamin with minerals  1 tablet Oral Daily  . ondansetron (ZOFRAN) IV  4 mg Intravenous Once  . pantoprazole  40 mg Oral Daily  . sodium chloride  1,000 mL Intravenous Once  . sodium chloride  500 mL Intravenous Once  . thiamine  100 mg Oral Daily   Or  . thiamine  100 mg Intravenous Daily  . DISCONTD: iohexol  20 mL Oral Q1 Hr x 2  . DISCONTD: pantoprazole (PROTONIX) IV  40 mg Intravenous Q12H  . DISCONTD: sodium chloride  3 mL Intravenous Q12H   Continuous Infusions:   . sodium chloride 75 mL/hr at 05/14/11 0902  . DISCONTD: sodium  chloride       PRN Meds:.alum & mag hydroxide-simeth, HYDROmorphone, iohexol, LORazepam, LORazepam, ondansetron (ZOFRAN) IV, ondansetron Assessment/Plan:  Samuel [name] presents with an active lower GI bleed. His hemoglobin has dropped to 9.1 prior to 2 units of pRBCs transfusion -> 11.1 this AM.  He is orthostatic but without much dizziness  The etiology for the lower GI bleed could include IBS vs. IBD vs AVM.   1. Lower GI Bleed - No longer actively bleeding, has rec'd 2 units to date. GI thinks this could be an AVM vs. Diverticular bleeding vs. Dieulafoy's lesion.   - GI consulted, will do colonoscopy, deferring to Dr. Marina Goodell - CBC QAM, type and screen done already, transfuse if Hb<8. Hb is 11.1 today after 2 units yesterday - FOBT positive  - Orthostatic on admission, will continue fluids at 40mL/hr NS - Protonix IV 40mg  BID - GI recs to decrease to 40mg  QD PO - Morphine/Zofran prn - Consider NS bolus should the patient remain orthostatic - Will order HIV test today to r/o HIV-related causes of presentation.  2. Alcohol Abuse - Samuel [name] has a history of alcohol abuse for 8-9 years where he states he has averaged 6 12oz beers/day. He states he had 3 yesterday and has not been drinking more or less than usual. His MCV is 93.7, AST/ALT elevated at 176 and 155. Tbili is 1.2, alk phos is 46. PT slightly elevated 15.6, INR 1.21. Urine has glucose of 250 and Ketones of 40. - on CIWA protocol - Will counsel on alcoholism - Fatty liver infiltrates on CT scan, but hepatomegaly appreciated. Lipase 51. - A1c 5.6.  3. GERD - Has a history of dysphagia and heartburn. Pt states he takes Zantac 2-4 times daily. Dysphagia may be due to previously diagnosed hiatal hernia.  - Protonix 40mg  PO QD  4. Diet/Fluids - 25mL/hr NS, Clears.   5. VTE - SCDs  6. Disposition - Pending GI recs, will continue hydration and monitor CBCs q8H. Transfused 2 units to date.   LOS: 1 day   This is a Psychologist, occupational Note.  The  care of the patient was discussed with Dr. Dorthula Rue and the assessment and plan formulated with their assistance.  Please see their attached note for official documentation of the daily encounter.  Samuel [name], Samuel [name] 05/14/2011, 9:41 AM

## 2011-05-14 NOTE — Progress Notes (Signed)
Internal Medicine Teaching Service Attending Note Date: 05/14/2011  Patient name: Samuel [name]  Medical record number: [id number]  Date of birth: May 15, 1980   I have seen and evaluated Lynnae Prude and discussed their care with the Residency Team.  Mr, Cretella has acute lower gi bleed most likely. Given history of some straining and flecks of blood in stool he could have hemorroidal bleed but no hemorrhoids reported on ED exam. His alcohol intake history is sufficient to invoke alcoholic hepatiitis to explain LFTs and fatty change. His child hood history of GI problems make inflmmatory disease possible. Agree with plans for colonoscopy and upper EGD if no cause/source found. I have reviewed his care with housestaff team and agree with their management. I would not transfuse further unless he rebleeds.  Physical Exam: Blood pressure 104/66, pulse 80, temperature 98.3 F (36.8 C), temperature source Oral, resp. rate 20, height 5\' 9"  (1.753 m), weight 123 lb 7.3 oz (56 kg), SpO2 99.00%. Abdomen is soft and nontender this morning.  Lab results: Results for orders placed during the hospital encounter of 05/13/11 (from the past 24 hour(s))  CBC     Status: Abnormal   Collection Time   05/13/11  1:45 PM      Component Value Range   WBC 7.8  4.0 - 10.5 (K/uL)   RBC 3.02 (*) 4.22 - 5.81 (MIL/uL)   Hemoglobin 9.8 (*) 13.0 - 17.0 (g/dL)   HCT 40.9 (*) 81.1 - 52.0 (%)   MCV 93.7  78.0 - 100.0 (fL)   MCH 32.5  26.0 - 34.0 (pg)   MCHC 34.6  30.0 - 36.0 (g/dL)   RDW 91.4  78.2 - 95.6 (%)   Platelets 137 (*) 150 - 400 (K/uL)  DIFFERENTIAL     Status: Abnormal   Collection Time   05/13/11  1:45 PM      Component Value Range   Neutrophils Relative 74  43 - 77 (%)   Neutro Abs 5.7  1.7 - 7.7 (K/uL)   Lymphocytes Relative 13  12 - 46 (%)   Lymphs Abs 1.0  0.7 - 4.0 (K/uL)   Monocytes Relative 13 (*) 3 - 12 (%)   Monocytes Absolute 1.0  0.1 - 1.0 (K/uL)   Eosinophils Relative 0  0 - 5 (%)   Eosinophils Absolute 0.0  0.0 - 0.7 (K/uL)   Basophils Relative 0  0 - 1 (%)   Basophils Absolute 0.0  0.0 - 0.1 (K/uL)  SAMPLE TO BLOOD BANK     Status: Normal   Collection Time   05/13/11  2:00 PM      Component Value Range   Blood Bank Specimen SAMPLE AVAILABLE FOR TESTING     Sample Expiration 05/14/2011    TYPE AND SCREEN     Status: Normal (Preliminary result)   Collection Time   05/13/11  2:00 PM      Component Value Range   ABO/RH(D) A NEG     Antibody Screen NEG     Sample Expiration 05/16/2011     Unit Number [id number]     Blood Component Type RED CELLS,LR     Unit division 00     Status of Unit ISSUED     Transfusion Status OK TO TRANSFUSE     Crossmatch Result Compatible     Unit Number [id number]     Blood Component Type RED CELLS,LR     Unit division 00     Status of Unit ISSUED  Transfusion Status OK TO TRANSFUSE     Crossmatch Result Compatible    ABO/RH     Status: Normal   Collection Time   05/13/11  2:00 PM      Component Value Range   ABO/RH(D) A NEG    CBC     Status: Abnormal   Collection Time   05/13/11  5:44 PM      Component Value Range   WBC 8.0  4.0 - 10.5 (K/uL)   RBC 2.72 (*) 4.22 - 5.81 (MIL/uL)   Hemoglobin 9.1 (*) 13.0 - 17.0 (g/dL)   HCT 40.9 (*) 81.1 - 52.0 (%)   MCV 94.9  78.0 - 100.0 (fL)   MCH 33.5  26.0 - 34.0 (pg)   MCHC 35.3  30.0 - 36.0 (g/dL)   RDW 91.4  78.2 - 95.6 (%)   Platelets 139 (*) 150 - 400 (K/uL)  PREPARE RBC (CROSSMATCH)     Status: Normal   Collection Time   05/13/11  7:30 PM      Component Value Range   Order Confirmation ORDER PROCESSED BY BLOOD BANK    HEMOGLOBIN A1C     Status: Normal   Collection Time   05/13/11  7:39 PM      Component Value Range   Hemoglobin A1C 5.6  <5.7 (%)   Mean Plasma Glucose 114  <117 (mg/dL)  CBC     Status: Abnormal   Collection Time   05/14/11  5:00 AM      Component Value Range   WBC 6.1  4.0 - 10.5 (K/uL)   RBC 3.47 (*) 4.22 - 5.81 (MIL/uL)   Hemoglobin 11.1 (*) 13.0 - 17.0  (g/dL)   HCT 21.3 (*) 08.6 - 52.0 (%)   MCV 91.4  78.0 - 100.0 (fL)   MCH 32.0  26.0 - 34.0 (pg)   MCHC 35.0  30.0 - 36.0 (g/dL)   RDW 57.8  46.9 - 62.9 (%)   Platelets 123 (*) 150 - 400 (K/uL)  COMPREHENSIVE METABOLIC PANEL     Status: Abnormal   Collection Time   05/14/11  6:15 AM      Component Value Range   Sodium 139  135 - 145 (mEq/L)   Potassium 4.5  3.5 - 5.1 (mEq/L)   Chloride 104  96 - 112 (mEq/L)   CO2 26  19 - 32 (mEq/L)   Glucose, Bld 78  70 - 99 (mg/dL)   BUN 6  6 - 23 (mg/dL)   Creatinine, Ser 5.28  0.50 - 1.35 (mg/dL)   Calcium 8.3 (*) 8.4 - 10.5 (mg/dL)   Total Protein 4.8 (*) 6.0 - 8.3 (g/dL)   Albumin 3.1 (*) 3.5 - 5.2 (g/dL)   AST 72 (*) 0 - 37 (U/L)   ALT 85 (*) 0 - 53 (U/L)   Alkaline Phosphatase 38 (*) 39 - 117 (U/L)   Total Bilirubin 1.8 (*) 0.3 - 1.2 (mg/dL)   GFR calc non Af Amer >90  >90 (mL/min)   GFR calc Af Amer >90  >90 (mL/min)  CBC     Status: Abnormal   Collection Time   05/14/11  6:15 AM      Component Value Range   WBC 6.1  4.0 - 10.5 (K/uL)   RBC 3.47 (*) 4.22 - 5.81 (MIL/uL)   Hemoglobin 11.1 (*) 13.0 - 17.0 (g/dL)   HCT 41.3 (*) 24.4 - 52.0 (%)   MCV 91.4  78.0 - 100.0 (fL)   MCH 32.0  26.0 - 34.0 (pg)   MCHC 35.0  30.0 - 36.0 (g/dL)   RDW 78.2  95.6 - 21.3 (%)   Platelets 124 (*) 150 - 400 (K/uL)    Imaging results:  Ct Abdomen Pelvis W Contrast  05/13/2011  *RADIOLOGY REPORT*  Clinical Data: Bright red blood per rectum with clots.  CT ABDOMEN AND PELVIS WITH CONTRAST  Technique:  Multidetector CT imaging of the abdomen and pelvis was performed following the standard protocol during bolus administration of intravenous contrast.  Contrast: 80mL OMNIPAQUE IOHEXOL 300 MG/ML IJ SOLN  Comparison: None.  Findings: The lung bases are clear.  No pleural or pericardial effusion.  The liver is diffusely low attenuating consistent with fatty infiltration.  There is some mixed attenuation material within the gallbladder which may be due to sludge.   No pericholecystic fluid or gallbladder wall thickening is identified.  The biliary tree, adrenal glands, spleen, pancreas and kidneys appear normal.  There is mild gaseous distention of the colon. The colon is otherwise normal in appearance.  The appendix is filled contrast and appears normal.  The terminal ileum is decompressed but there is no wall thickening present.  Small bowel is otherwise unremarkable.  There is no lymphadenopathy or fluid.  No focal bony abnormality.  IMPRESSION:  1.  Mild gaseous distention of the colon.  No focal abnormality is identified. 2.  Fatty infiltration of the liver. 3.  Possible gallbladder sludge.  No evidence of cholecystitis by CT.  Original Report Authenticated By: Bernadene Bell. Maricela Curet, M.D.    Assessment and Plan: I agree with the formulated Assessment and Plan with the following changes:See reference to management above. Samuel [name]

## 2011-05-14 NOTE — Progress Notes (Signed)
     [facility] Daily Rounding Note 05/14/2011, 8:24 AM  SUBJECTIVE:       Tachy to 160's  With standing yesterday afternoon, rates now in 70s and 80s.  Passed blood once at about 10 PM yesterday.  Small, palm full amount.   He is NPO and has been placed on contact precautions , apparently over concern for Norvirus.   OBJECTIVE:        General: Pale, thin, slightly anxious.     Vital signs in last 24 hours:    Temp:  [97.9 F (36.6 C)-99.2 F (37.3 C)] 98.3 F (36.8 C) (03/09 0626) Pulse Rate:  [78-109] 80  (03/09 0626) Resp:  [12-20] 20  (03/09 0626) BP: (101-135)/(63-84) 104/66 mmHg (03/09 0626) SpO2:  [98 %-100 %] 99 % (03/09 0626) Weight:  [123 lb 7.3 oz (56 kg)] 123 lb 7.3 oz (56 kg) (03/08 1713) Last BM Date: 05/13/11  Heart: RRR Chest: clear Abdomen: soft, NT, ND.  Active BS/  Extremities: no edema Neuro/Psych:  Pleasant, alert, slightly anxiuos  Intake/Output from previous day: 03/08 0701 - 03/09 0700 In: 1538.3 [I.V.:933.3; Blood:605] Out: 2125 [Urine:1500; Stool:625]  Intake/Output this shift:    Lab Results:  Basename 05/14/11 0615 05/14/11 0500 05/13/11 1744  WBC 6.1 6.1 8.0  HGB 11.1* 11.1* 9.1*  HCT 31.7* 31.7* 25.8*  PLT 124* 123* 139*   BMET  Basename 05/14/11 0615 05/13/11 0520  NA 139 137  K 4.5 4.7  CL 104 100  CO2 26 26  GLUCOSE 78 133*  BUN 6 20  CREATININE 0.70 0.69  CALCIUM 8.3* 8.8   LFT  Basename 05/14/11 0615 05/13/11 0520  PROT 4.8* 6.2  ALBUMIN 3.1* 3.9  AST 72* 176*  ALT 85* 155*  ALKPHOS 38* 46  BILITOT 1.8* 1.2  BILIDIR -- --  IBILI -- --   PT/INR  Basename 05/13/11 0520  LABPROT 15.6*  INR 1.21   ASSESMENT: 1.  LGIB 2.  ABL anemia, normocytic.  S/P transfusion 2 units PRBCs with adequate response. 3.  Tachycardia 4.  Alcoholic hepatitis, fatty liver. 5.  Anxiety 6.  GERD.  On IV Protonix q 12 hours 7.  Alcohol abuse.  On etoh withdrawal protocol.   PLAN: 1.  D/C contact precautions, this is not a  viral gastroenteritis.   2.  Allow clears.   3.  Protonix to once daily, PO. 4.  CBC in AM 4.  Timing of endoscopic studies per Dr Zurisadai Helminiak.   LOS: 1 day   Sarah Gribbin  05/14/2011, 8:24 AM Pager: [phone]   GI ATTENDING  SEEN AND EXAMINED. FAMILY IN ROOM. AGREE WITH ABOVE. NO BM TODAY. EXAM BENIGN. PLAN COLONOSCOPY IN AM.The nature of the procedure, as well as the risks, benefits, and alternatives were carefully and thoroughly reviewed with the patient. Ample time for discussion and questions allowed. The patient understood, was satisfied, and agreed to proceed.   Najat Olazabal N. Samreet Edenfield, Jr., M.D. [facility] of Gastroenterology  

## 2011-05-15 ENCOUNTER — Encounter (HOSPITAL_COMMUNITY)
Admission: EM | Disposition: A | Discharge status: Home / Self Care | Payer: Self-pay | Source: Home / Self Care | Attending: Internal Medicine

## 2011-05-15 ENCOUNTER — Encounter (HOSPITAL_COMMUNITY): Payer: Self-pay | Admitting: Internal Medicine

## 2011-05-15 DIAGNOSIS — K219 Gastro-esophageal reflux disease without esophagitis: Secondary | ICD-10-CM

## 2011-05-15 DIAGNOSIS — D126 Benign neoplasm of colon, unspecified: Secondary | ICD-10-CM

## 2011-05-15 HISTORY — PX: COLONOSCOPY: SHX5424

## 2011-05-15 HISTORY — PX: ESOPHAGOGASTRODUODENOSCOPY: SHX5428

## 2011-05-15 LAB — CBC
HCT: 30.4 % — ABNORMAL LOW (ref 39.0–52.0)
Hemoglobin: 10.8 g/dL — ABNORMAL LOW (ref 13.0–17.0)
MCV: 91.4 fL (ref 78.0–100.0)
Platelets: 135 10*3/uL — ABNORMAL LOW (ref 150–400)
RBC: 3.7 MIL/uL — ABNORMAL LOW (ref 4.22–5.81)
RBC: 3.32 MIL/uL — ABNORMAL LOW (ref 4.22–5.81)
WBC: 5.2 10*3/uL (ref 4.0–10.5)
WBC: 5 10*3/uL (ref 4.0–10.5)

## 2011-05-15 LAB — TYPE AND SCREEN
Unit division: 0
Unit division: 0

## 2011-05-15 SURGERY — COLONOSCOPY
Anesthesia: Moderate Sedation

## 2011-05-15 MED ORDER — FENTANYL CITRATE 0.05 MG/ML IJ SOLN
INTRAMUSCULAR | Status: AC
  Filled 2011-05-15: qty 2

## 2011-05-15 MED ORDER — DIPHENHYDRAMINE HCL 50 MG/ML IJ SOLN
INTRAMUSCULAR | Status: AC
  Filled 2011-05-15: qty 1

## 2011-05-15 MED ORDER — MIDAZOLAM HCL 5 MG/5ML IJ SOLN
INTRAMUSCULAR | Status: DC | PRN
  Administered 2011-05-15: 2 mg via INTRAVENOUS
  Administered 2011-05-15: 1 mg via INTRAVENOUS
  Administered 2011-05-15: 2 mg via INTRAVENOUS
  Administered 2011-05-15: 1 mg via INTRAVENOUS
  Administered 2011-05-15 (×2): 2 mg via INTRAVENOUS

## 2011-05-15 MED ORDER — MIDAZOLAM HCL 10 MG/2ML IJ SOLN
INTRAMUSCULAR | Status: AC
  Filled 2011-05-15: qty 2

## 2011-05-15 MED ORDER — FENTANYL NICU IV SYRINGE 50 MCG/ML
INJECTION | INTRAMUSCULAR | Status: DC | PRN
  Administered 2011-05-15 (×4): 25 ug via INTRAVENOUS

## 2011-05-15 MED ORDER — DIPHENHYDRAMINE HCL 50 MG/ML IJ SOLN
INTRAMUSCULAR | Status: DC | PRN
  Administered 2011-05-15: 25 mg via INTRAVENOUS

## 2011-05-15 MED ORDER — SODIUM CHLORIDE 0.9 % IV SOLN: Freq: Once | INTRAVENOUS | Status: DC

## 2011-05-15 NOTE — Progress Notes (Signed)
Subjective: He noticed some blood with his first stools with the bowel prep but now has been having clear stools. Denies any abdominal cramps.  Objective: Vital signs in last 24 hours: Filed Vitals:   05/14/11 1400 05/14/11 2137 05/15/11 0535 05/15/11 0933  BP: 104/67 123/83 122/77 121/74  Pulse: 87 69 71 63  Temp: 97.9 F (36.6 C) 98 F (36.7 C) 98.2 F (36.8 C) 96 F (35.6 C)  TempSrc: Oral Oral Oral   Resp: 18 16 16 18   Height:      Weight:      SpO2: 97% 100% 100%    Physical Exam: BP 121/74  Pulse 63  Temp(Src) 96 F (35.6 C) (Oral)  Resp 18  Ht 5\' 9"  (1.753 m)  Wt 123 lb 7.3 oz (56 kg)  BMI 18.23 kg/m2  SpO2 100%  General Appearance:    Alert, cooperative, no distress, appears stated age  Head:    Normocephalic, without obvious abnormality, atraumatic  Eyes:    PERRL, conjunctiva/corneas clear, EOM's intact, fundi    benign, both eyes       Ears:    Normal TM's and external ear canals, both ears  Nose:   Nares normal, septum midline, mucosa normal, no drainage    or sinus tenderness  Throat:   Lips, mucosa, and tongue normal; teeth and gums normal  Neck:   Supple, symmetrical, trachea midline, no adenopathy;       thyroid:  No enlargement/tenderness/nodules; no carotid   bruit or JVD  Back:     Symmetric, no curvature, ROM normal, no CVA tenderness  Lungs:     Clear to auscultation bilaterally, respirations unlabored  Chest wall:    No tenderness or deformity  Heart:    Regular rate and rhythm, S1 and S2 normal, no murmur, rub   or gallop  Abdomen:     Soft, non tender, bowel sounds active all four quadrants,    hepatomegaly present.( liver palpable 2 cm below the coastal margin)  Genitalia:    Normal male without lesion, discharge or tenderness  Rectal:    Normal tone, normal prostate, no masses or tenderness;   guaiac negative stool  Extremities:   Extremities normal, atraumatic, no cyanosis or edema  Pulses:   2+ and symmetric all extremities  Skin:    Skin color, texture, turgor normal, no rashes or lesions  Lymph nodes:   Cervical, supraclavicular, and axillary nodes normal  Neurologic:   CNII-XII intact. Normal strength, sensation and reflexes      throughout   Lab Results: CBC:    Component Value Date/Time   WBC 5.0 05/15/2011 0627   HGB 10.8* 05/15/2011 0627   HCT 30.4* 05/15/2011 0627   PLT 128* 05/15/2011 0627   MCV 91.6 05/15/2011 0627   NEUTROABS 5.7 05/13/2011 1345   LYMPHSABS 1.0 05/13/2011 1345   MONOABS 1.0 05/13/2011 1345   EOSABS 0.0 05/13/2011 1345   BASOSABS 0.0 05/13/2011 1345      Micro Results: Reviewed and documented in Electronic Record Studies/Results: Reviewed and documented in Electronic Record Medications:  Scheduled Meds:    . sodium chloride   Intravenous Once  . folic acid  1 mg Oral Daily  . mulitivitamin with minerals  1 tablet Oral Daily  . pantoprazole  40 mg Oral Daily  . peg 3350 powder  1 kit Oral Once  . thiamine  100 mg Oral Daily   Or  . thiamine  100 mg Intravenous Daily   Continuous  Infusions:    . sodium chloride 75 mL/hr at 05/14/11 0902   PRN Meds:.alum & mag hydroxide-simeth, HYDROmorphone, LORazepam, LORazepam, ondansetron (ZOFRAN) IV, ondansetron Assessment/Plan:  1.Lower GI bleed:Etiology unclear but diverticular bleed vs IBD vs AVM's fall under our differentials. GI following along- Colonoscopy planned for today. Patient has completed bowel prep. Denies any abdominal cramps, dizziness. H and H stable.  - CBC q12. - Transfuse if Hb<8.  - F/U after colonoscopy   2. Alcohol Abuse - - Fatty liver infiltrates on CT scan and AST /ALT ratio corroborates with alcohol history..  - on CIWA protocol  - Counseled on cessation  3. GERD: Protonix daily.  4. Dispo; Pending further GI work up.      LOS: 2 days   Janey Petron 05/15/2011, 9:50 AM

## 2011-05-15 NOTE — Interval H&P Note (Signed)
History and Physical Interval Note:  05/15/2011 10:16 AM  Samuel [name]  has presented today for surgery, with the diagnosis of GIB  The various methods of treatment have been discussed with the patient and family. After consideration of risks, benefits and other options for treatment, the patient has consented to  Procedure(s) (LRB): COLONOSCOPY (N/A) as a surgical intervention .  The patients' history has been reviewed, patient examined, no change in status, stable for surgery.  I have reviewed the patients' chart and labs.  Questions were answered to the patient's satisfaction.     [name]

## 2011-05-15 NOTE — H&P (View-Only) (Signed)
     Samuel [name] Daily Rounding Note 05/14/2011, 8:24 AM  SUBJECTIVE:       Tachy to 160's  With standing yesterday afternoon, rates now in 70s and 80s.  Passed blood once at about 10 PM yesterday.  Small, palm full amount.   He is NPO and has been placed on contact precautions , apparently over concern for Norvirus.   OBJECTIVE:        General: Pale, thin, slightly anxious.     Vital signs in last 24 hours:    Temp:  [97.9 F (36.6 C)-99.2 F (37.3 C)] 98.3 F (36.8 C) (03/09 0626) Pulse Rate:  [78-109] 80  (03/09 0626) Resp:  [12-20] 20  (03/09 0626) BP: (101-135)/(63-84) 104/66 mmHg (03/09 0626) SpO2:  [98 %-100 %] 99 % (03/09 0626) Weight:  [123 lb 7.3 oz (56 kg)] 123 lb 7.3 oz (56 kg) (03/08 1713) Last BM Date: 05/13/11  Heart: RRR Chest: clear Abdomen: soft, NT, ND.  Active BS/  Extremities: no edema Neuro/Psych:  Pleasant, alert, slightly anxiuos  Intake/Output from previous day: 03/08 0701 - 03/09 0700 In: 1538.3 [I.V.:933.3; Blood:605] Out: 2125 [Urine:1500; Stool:625]  Intake/Output this shift:    Lab Results:  Basename 05/14/11 0615 05/14/11 0500 05/13/11 1744  WBC 6.1 6.1 8.0  HGB 11.1* 11.1* 9.1*  HCT 31.7* 31.7* 25.8*  PLT 124* 123* 139*   BMET  Basename 05/14/11 0615 05/13/11 0520  NA 139 137  K 4.5 4.7  CL 104 100  CO2 26 26  GLUCOSE 78 133*  BUN 6 20  CREATININE 0.70 0.69  CALCIUM 8.3* 8.8   LFT  Basename 05/14/11 0615 05/13/11 0520  PROT 4.8* 6.2  ALBUMIN 3.1* 3.9  AST 72* 176*  ALT 85* 155*  ALKPHOS 38* 46  BILITOT 1.8* 1.2  BILIDIR -- --  IBILI -- --   PT/INR  Basename 05/13/11 0520  LABPROT 15.6*  INR 1.21   ASSESMENT: 1.  LGIB 2.  ABL anemia, normocytic.  S/P transfusion 2 units PRBCs with adequate response. 3.  Tachycardia 4.  Alcoholic hepatitis, fatty liver. 5.  Anxiety 6.  GERD.  On IV Protonix q 12 hours 7.  Alcohol abuse.  On etoh withdrawal protocol.   PLAN: 1.  D/C contact precautions, this is not a  viral gastroenteritis.   2.  Allow clears.   3.  Protonix to once daily, PO. 4.  CBC in AM 4.  Timing of endoscopic studies per Dr Marina Goodell.   LOS: 1 day   Jennye Moccasin  05/14/2011, 8:24 AM Pager: [phone]   [name] ATTENDING  SEEN AND EXAMINED. FAMILY IN ROOM. AGREE WITH ABOVE. NO BM TODAY. EXAM BENIGN. PLAN COLONOSCOPY IN AM.The nature of the procedure, as well as the risks, benefits, and alternatives were carefully and thoroughly reviewed with the patient. Ample time for discussion and questions allowed. The patient understood, was satisfied, and agreed to proceed.   Wilhemina Bonito. Eda Keys., M.D. [facility] Division of Gastroenterology

## 2011-05-15 NOTE — Op Note (Signed)
[facility] [address] [place], [place]  [address]  COLONOSCOPY PROCEDURE REPORT  PATIENT:  Samuel [name], Samuel [name]  MR#:  [id number] BIRTHDATE:  Jul 24, 1980, 30 yrs. old  GENDER:  male ENDOSCOPIST:  Wilhemina Bonito. Eda Keys, MD REF. BY:  Lina Sayre, M.D. PROCEDURE DATE:  05/15/2011 PROCEDURE:  Colonoscopy with snare polypectomy x 1 ASA CLASS:  Class I INDICATIONS:  rectal bleeding MEDICATIONS:   Fentanyl 100 mcg IV, Versed 8 mg IV, Benadryl 25 mg IV  DESCRIPTION OF PROCEDURE:   After the risks benefits and alternatives of the procedure were thoroughly explained, informed consent was obtained.  Digital rectal exam was performed and revealed no abnormalities.   The Pentax Colonoscope [id number], [id number]) and [id number] ([id number]) endoscope was introduced through the anus and advanced to the cecum, which was identified by both the appendix and ileocecal valve, without limitations.  The quality of the prep was excellent, using MoviPrep.  The instrument was then slowly withdrawn as the colon was fully examined. <<PROCEDUREIMAGES>>  FINDINGS:  The terminal ileum appeared normal.  A pedunculated polyp was found in the rectum and snared without cautery. Retrieval was successful.  Otherwise normal colonoscopy without other polyps, masses, vascular ectasias, or inflammatory changes. Retroflexed views in the rectum revealed no abnormalities. No beeding  or blood in colon. The scope was then withdrawn and the procedure completed.  COMPLICATIONS:  None  ENDOSCOPIC IMPRESSION: 1) Normal terminal ileum 2) Pedunculated polyp in the rectum - removed 3) Otherwise norml colonoscopy 4) No caus for bleeding found  RECOMMENDATIONS: 1) Upper endoscopy today  ______________________________ Wilhemina Bonito. Eda Keys, MD  CC:  Lina Sayre, MD;  The Patient  n. eSIGNED:   Wilhemina Bonito. Eda Keys at 05/15/2011 10:48 AM  Christmas, Homero Fellers, [phone]

## 2011-05-15 NOTE — Progress Notes (Signed)
Pt's prep has gone well.  Blood seen at first, but now stools are clear.   Hgb is stable at 10.8.  Platelets are 128.    For colonoscopy today to evaluate episodes of hematochezia.    Jennye Moccasin PA-C

## 2011-05-15 NOTE — Op Note (Signed)
[facility] [address] [place], [place]  [address]  ENDOSCOPY PROCEDURE REPORT  PATIENT:  Samuel [name], Samuel [name]  MR#:  [id number] BIRTHDATE:  03-27-1980, 30 yrs. old  GENDER:  male  ENDOSCOPIST:  Wilhemina Bonito. Eda Keys, MD Referred by:  Lina Sayre, M.D.  PROCEDURE DATE:  05/15/2011 PROCEDURE:  EGD, diagnostic 432[address] ASA CLASS:  Class I INDICATIONS:  red rectal bleeding  MEDICATIONS:   There was residual sedation effect present from prior procedure., Versed 2 mg IV TOPICAL ANESTHETIC:  Cetacaine Spray  DESCRIPTION OF PROCEDURE:   After the risks benefits and alternatives of the procedure were thoroughly explained, informed consent was obtained.  The Pentax Gastroscope [id number] endoscope was introduced through the mouth and advanced to the third portion of the duodenum, without limitations.  The instrument was slowly withdrawn as the mucosa was fully examined. <<PROCEDUREIMAGES>>  The upper, middle, and distal third of the esophagus were carefully inspected and no abnormalities were noted. The z-line was well seen at the GEJ. The endoscope was pushed into the fundus which was normal including a retroflexed view. The antrum,gastric body, first and second part of the duodenum were unremarkable.  No active bleeding or blood seen.    Retroflexed views revealed no abnormalities.    The scope was then withdrawn from the patient and the procedure completed.  COMPLICATIONS:  None  ENDOSCOPIC IMPRESSION: 1) Normal EGD 2) No active bleeding or blood seen 3) Source of GI bleed remains unclear  RECOMMENDATIONS: 1) Meckles scani am 2) capsule endoscopy if negative  ______________________________ Wilhemina Bonito. Eda Keys, MD  CC:  Lina Sayre, MD; The Patient  n. eSIGNED:   Wilhemina Bonito. Eda Keys at 05/15/2011 11:00 AM  Lopez, Homero Fellers, [phone]

## 2011-05-16 ENCOUNTER — Inpatient Hospital Stay (HOSPITAL_COMMUNITY): Payer: Self-pay

## 2011-05-16 ENCOUNTER — Encounter (HOSPITAL_COMMUNITY): Payer: Self-pay | Admitting: Internal Medicine

## 2011-05-16 DIAGNOSIS — F1010 Alcohol abuse, uncomplicated: Secondary | ICD-10-CM

## 2011-05-16 DIAGNOSIS — K922 Gastrointestinal hemorrhage, unspecified: Secondary | ICD-10-CM

## 2011-05-16 DIAGNOSIS — D649 Anemia, unspecified: Secondary | ICD-10-CM

## 2011-05-16 LAB — CBC
HCT: 32.8 % — ABNORMAL LOW (ref 39.0–52.0)
Hemoglobin: 11.4 g/dL — ABNORMAL LOW (ref 13.0–17.0)
MCH: 32 pg (ref 26.0–34.0)
MCHC: 34.8 g/dL (ref 30.0–36.0)
RDW: 14.3 % (ref 11.5–15.5)

## 2011-05-16 MED ORDER — ADULT MULTIVITAMIN W/MINERALS CH: 1.0000 | ORAL_TABLET | Freq: Every day | ORAL | Status: DC

## 2011-05-16 MED ORDER — SERTRALINE HCL 25 MG PO TABS: 25.0000 mg | ORAL_TABLET | Freq: Every day | ORAL | Status: DC

## 2011-05-16 MED ORDER — SODIUM PERTECHNETATE TC 99M INJECTION
10.0000 | Freq: Once | INTRAVENOUS | Status: AC | PRN
  Administered 2011-05-16: 10 via INTRAVENOUS

## 2011-05-16 NOTE — Progress Notes (Signed)
IM Attending  30 man adm 3-8 for large GI bleed.  EGD and colonoscopy negative.  Meckel's scan negative.  Apparantly next step is capsule endoscopy. His labs are very worrisome for cirrhosis.  Has been a heavy drinker.  AST 170 and ALT of 150 suggest his heavy drinking is current.  Albumin of 3.0 and INR 1.2 suggest early liver failure.  He has been informed of the importance of getting into an abstinence program. His girlfriend raised the issue of panic attacks and he reports a remote dx of depression.  has never had sustained anti-depression rx.  I am not sure how effective anti-depressants are for alcoholism, but SSRIs are effective for panic attacks.  If liver toxicity is not a great concern, consider SSRI (e.g., zoloft).

## 2011-05-16 NOTE — Discharge Summary (Signed)
Internal Medicine Teaching [facility] Discharge Note  Name: Samuel [name] MRN: [id number] DOB: 16-Jul-1980 31 y.o.  Date of Admission: 05/13/2011  4:33 AM Date of Discharge: 05/16/2011 Attending Physician: Ulyess Mort, MD  Discharge Diagnosis: Principal Problem:  Lower GI bleed Active Problems:  GERD (gastroesophageal reflux disease)  Alcohol abuse  Fatty liver  Nonspecific (abnormal) findings on radiological and other examination of gastrointestinal tract  Acute posthemorrhagic anemia  Nonspecific elevation of levels of transaminase or lactic acid dehydrogenase (LDH)  Benign neoplasm of colon  LABS to follow at follow up appointment: CBC  Discharge Medications: Medication List  As of 05/16/2011 11:59 AM   STOP taking these medications         ranitidine 150 MG capsule         TAKE these medications         mulitivitamin with minerals Tabs   Take 1 tablet by mouth daily.      sertraline 25 MG tablet   Commonly known as: ZOLOFT   Take 1 tablet (25 mg total) by mouth daily.            Disposition and follow-up:   Samuel [name] was discharged from [facility] in Stable condition.    Follow-up Appointments: Follow-up Information    Follow up with Samuel Flemings, MD on 05/19/2011. (2 PM.  This is a RN visit, at that time you will receive information  and dates regarding capsule endoscopy )    Contact information:   [address] [facility] [address] [place] [address] [place] [address] [phone]       Follow up with Samuel Cowman, MD. Schedule an appointment as soon as possible for a visit on 05/26/2011. (11: 15 AM)    Contact information:   [address] [place] [address] [phone]         Discharge Orders    Future Appointments: Provider: Department: Dept Phone: Center:   05/19/2011 2:00 PM [facility] [place] Office [phone] [facility]   05/26/2011 11:15 AM [name], MD [facility] [phone] [facility]     Future Orders Please Complete By Expires   Increase activity slowly      Call MD for:  persistant dizziness or light-headedness      Call MD for:  extreme fatigue         Consultations: Treatment Team:  Hilarie Fredrickson, MD  Procedures Performed:  Nm Bowel Img Meckels  05/16/2011  *RADIOLOGY REPORT*  Clinical Data: Evaluate for Meckel's diverticulum  NUCLEAR MEDICINE MECKELS SCAN  Technique:  Sequential abdominal images were obtained following intravenous injection of radiopharmaceutical.  Radiopharmaceutical: CURIE 69mTc04 SODIUM PERTECHNETATE TC 72M INJECTION  Comparison: 05/13/2011  Findings: Following the intravenous administration of the radiopharmaceutical there is physiologic tracer uptake within the gastric mucosa.  Blood pool activity as well as renal activity is noted.  No abnormal foci of increased radiotracer uptake identified to suggest ectopic gastric mucosa.  IMPRESSION:  1.  Negative Meckel's scan.  Original Report Authenticated By: [name], M.D.   Ct Abdomen Pelvis W Contrast  05/13/2011  *RADIOLOGY REPORT*  Clinical Data: Bright red blood per rectum with clots.  CT ABDOMEN AND PELVIS WITH CONTRAST  Technique:  Multidetector CT imaging of the abdomen and pelvis was performed following the standard protocol during bolus administration of intravenous contrast.  Contrast: 80mL OMNIPAQUE IOHEXOL 300 MG/ML IJ SOLN  Comparison: None.  Findings:  The lung bases are clear.  No pleural or pericardial effusion.  The liver is diffusely low attenuating consistent with fatty infiltration.  There is some mixed attenuation material within the gallbladder which may be due to sludge.  No pericholecystic fluid or gallbladder wall thickening is identified.  The biliary tree, adrenal glands, spleen, pancreas and kidneys appear normal.  There is mild gaseous distention of the colon. The colon is otherwise normal in appearance.  The appendix  is filled contrast and appears normal.  The terminal ileum is decompressed but there is no wall thickening present.  Small bowel is otherwise unremarkable.  There is no lymphadenopathy or fluid.  No focal bony abnormality.  IMPRESSION:  1.  Mild gaseous distention of the colon.  No focal abnormality is identified. 2.  Fatty infiltration of the liver. 3.  Possible gallbladder sludge.  No evidence of cholecystitis by CT.  Original Report Authenticated By: Bernadene Bell. Samuel [name], M.D.      Admission HPI: 31 year old man from [place] who moved to [place] about 6 months ago with past medical history significant for GERD presents to the ER with lower GI bleed starting on the night of his admission.  He was in his usual state of health until the supper time on the night of his admission. He states that he felt nauseous, clammy and had the urge to move her bowels- he thought that he had diarrhea but when he looked at his commode, it was full of blood and he decided to come to the ER. He did not have any abdominal pain in the beginning but it started in the ER. He describes his pain as crampy, intermittent discomfort, rates his pain as 8/10 at its worst. He has had 5 episodes in total since last night- out of which 3 were witnessed in the ER. He was constipated for last 1 week.He reports having diarrhea and constipation alternating ly. Of note that he was seen in the ER about 1 week ago for nausea , vomiting and diarrhea. He reports having intermittent episodes of small amounts of blood in his stools intermittently, last episode was about 6-8 months ago that he thought was likely related to hemorrhoids.  He states that he has lost about 5 lbs in last few weeks but his wife states that he been steady on his weight - gained about 15 lbs since he moved to [place].  He also reports having chills, dizziness but denies any hematemesis, chest pain, palpitations, SOB.  He reports hospitalization for abdominal pain  about 2-3 years ago when he was treated with I V antibioticsat [place].   Hospital Course by problem list: 1. Lower GI bleed - Patient presented with acute painless hematochezia .On the day of his admission, his Hb dropped from 11 to 9 in the setting of active GI bleed and was associated with symptoms of dizziness and orthostasis requiring him to be transfused with two units of blood.  GI was consulted, and EGD and colonoscopy were negative except for a rectal polyp which was not thought to be the source for significant GI bleed.  He also underwent a Meckel's Scan which was also negative.  The etiology of the active symptomatic rectal bleeding was unclear on discharge. Capsule endoscopy would be the next step in his management that would be pursued on outpatient basis.  His H and H was stable at the time of discharge. He needs to get a CBC drawn at his outpatient follow up appointment. 2. Alcohol abuse -  The patient reports a 9 year history of 3-12 twelve ounce beers/daily, with an average of six prior to admission.  He was placed on CIWA protocol, and was not thought to be withdrawing at any point during his admission.  We counseled him extensively on alcohol cessation, in the setting of abnormal coags, decreased albumin, elevated LFTs (AST>ALT) and fatty infiltration seen on the CT scan.  He seems eager to quit drinking at time of discharge. 3. 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 We may consider escalating the dose at his follow up visit depending on his response.    Discharge Vitals:  BP 121/85  Pulse 63  Temp(Src) 97.5 F (36.4 C) (Oral)  Resp 18  Ht 5\' 9"  (1.753 m)  Wt 123 lb 7.3 oz (56 kg)  BMI 18.23 kg/m2  SpO2 99%  Discharge Labs:  Results for orders placed during the hospital encounter of 05/13/11 (from the past 24 hour(s))  CBC     Status: Abnormal   Collection Time   05/16/11  6:05 AM       Component Value Range   WBC 5.9  4.0 - 10.5 (K/uL)   RBC 3.56 (*) 4.22 - 5.81 (MIL/uL)   Hemoglobin 11.4 (*) 13.0 - 17.0 (g/dL)   HCT 16.1 (*) 09.6 - 52.0 (%)   MCV 92.1  78.0 - 100.0 (fL)   MCH 32.0  26.0 - 34.0 (pg)   MCHC 34.8  30.0 - 36.0 (g/dL)   RDW 04.5  40.9 - 81.1 (%)   Platelets 157  150 - 400 (K/uL)   We spent more than 30 minutes discharging this patient.  Signed: Jacob Chamblee 05/16/2011, 11:59 AM

## 2011-05-16 NOTE — Discharge Instructions (Signed)
Please come to the ER if you start having episodes of bleeding and feel dizzy or lightheaded.

## 2011-05-16 NOTE — Progress Notes (Signed)
     [facility] Daily Rounding Note 05/16/2011, 10:42 AM  SUBJECTIVE:     No stools since colon prep.  Feels well.  Still anxiuos  OBJECTIVE:        General: Looks pale and generally unwell     Vital signs in last 24 hours:    Temp:  [97.5 F (36.4 C)-98.4 F (36.9 C)] 97.5 F (36.4 C) (03/11 0500) Pulse Rate:  [78-98] 78  (03/11 0500) Resp:  [8-24] 18  (03/11 0500) BP: (94-144)/(47-77) 94/58 mmHg (03/11 0500) SpO2:  [97 %-100 %] 99 % (03/11 0500) Last BM Date: 05/15/11  Heart: RRR Chest: Clear Abdomen: soft, NT, ND.  Active BS  Extremities: no pedal edema Neuro/Psych:  Anxious.  Not confused.    Intake/Output from previous day: 03/10 0701 - 03/11 0700 In: 930 [P.O.:480; I.V.:450] Out: -   Intake/Output this shift:    Lab Results:  Basename 05/16/11 0605 05/15/11 0627 05/14/11 2318  WBC 5.9 5.0 5.2  HGB 11.4* 10.8* 11.9*  HCT 32.8* 30.4* 33.8*  PLT 157 128* 135*   BMET  Basename 05/14/11 0615  NA 139  K 4.5  CL 104  CO2 26  GLUCOSE 78  BUN 6  CREATININE 0.70  CALCIUM 8.3*   LFT  Basename 05/14/11 0615  PROT 4.8*  ALBUMIN 3.1*  AST 72*  ALT 85*  ALKPHOS 38*  BILITOT 1.8*  BILIDIR --  IBILI --     Studies/Results: Nm Bowel Img Meckels  05/16/2011  *RADIOLOGY REPORT*  Clinical Data: Evaluate for Meckel's diverticulum  NUCLEAR MEDICINE MECKELS SCAN  Technique:  Sequential abdominal images were obtained following intravenous injection of radiopharmaceutical.  Radiopharmaceutical: CURIE 52mTc04 SODIUM PERTECHNETATE TC 18M INJECTION  Comparison: 05/13/2011  Findings: Following the intravenous administration of the radiopharmaceutical there is physiologic tracer uptake within the gastric mucosa.  Blood pool activity as well as renal activity is noted.  No abnormal foci of increased radiotracer uptake identified to suggest ectopic gastric mucosa.  IMPRESSION:  1.  Negative Meckel's scan.  Original Report Authenticated By: [name], M.D.     ASSESMENT: 1.  Acute, painless hematochezia.  Source not found on Colon, EGD, Meckels scan.  Needs capsule endo.   2.  ABL anemia.  S/P transfusions with 2 units blood. 3.  Alcoholism.  With fatty liver, mild hepatitis pattern transaminases. 4.  Anxiety.   5.  Pedunculated rectal polyp, removed, pathology pending from yesterday. 6.  Thrombocytopenia.   PLAN: 1.  Has ROV with RN at Dr Lamar Sprinkles office this Thursday.  They will set up date for the CE study then.  See follow up charting under discharge.  2.  Ok to discharge today.   3.  Alcohol abstinence. 4.  Does not need PPI.  Regular diet.   LOS: 3 days   Jennye Moccasin  05/16/2011, 10:42 AM Pager: [phone]

## 2011-05-16 NOTE — Progress Notes (Signed)
Nsg Discharge Note  Admit Date:  05/13/2011 Discharge date: 05/16/2011   Samuel [name] to be D/C'd Home per MD order.  AVS completed.  Copy for chart, and copy for patient signed, and dated. Patient/caregiver able to verbalize understanding.  Discharge Medication:  Samuel [name], Samuel [name]  Home Medication Instructions [id number]   Printed on:05/16/11 1510  Medication Information                    Multiple Vitamin (MULITIVITAMIN WITH MINERALS) TABS Take 1 tablet by mouth daily.           sertraline (ZOLOFT) 25 MG tablet Take 1 tablet (25 mg total) by mouth daily.             Discharge Assessment: Filed Vitals:   05/16/11 1126  BP: 121/85  Pulse: 63  Temp:   Resp:    Skin clean, dry and intact without evidence of skin break down, no evidence of skin tears noted. IV catheter discontinued intact. Site without signs and symptoms of complications - no redness or edema noted at insertion site, patient denies c/o pain - only slight tenderness at site.  Dressing with slight pressure applied.  D/c Instructions-Education: Discharge instructions given to patient/family with verbalized understanding. D/c education completed with patient/family including follow up instructions, medication list, d/c activities limitations if indicated, with other d/c instructions as indicated by MD - patient able to verbalize understanding, all questions fully answered. Patient instructed to return to ED, call 911, or call MD for any changes in condition.  Patient escorted via WC, and D/C home via private auto.  Samuel Gambrell Consuella Lose, RN 05/16/2011 3:10 PM

## 2011-05-16 NOTE — Progress Notes (Signed)
Clinical Social Work-CSW met with pt at bedside to discuss SA and resources. Pt declined SBIRT but was very open about hx of ETOH use/abuse as well as prior periods of sobriety. Pt relayed that he was sober for 3 years and recently relapsed due to various stressors. Pt displayed insight into connection between ETOH and admitting diagnosis. CSW reviewed strategies for maintaining sobriety including CDIOP and AA meetings and provided contact information should pt have any questions arise post d/c-No further needs at this time-Samuel [name]-MSW, [phone]

## 2011-05-16 NOTE — Progress Notes (Signed)
Resident Co-sign Daily Note: I have seen the patient and reviewed the daily progress note by Samuel Patel  MS IVand discussed the care of the patient with them.  See below for documentation of my findings, assessment, and plans.  Subjective: He denies any abdominal pain or any bloody bowel movements.  Objective: Vital signs in last 24 hours: Filed Vitals:   05/15/11 1130 05/15/11 1400 05/15/11 2132 05/16/11 0500  BP: 125/77 98/63 98/59  94/58  Pulse:  98 80 78  Temp:  97.5 F (36.4 C) 98.4 F (36.9 C) 97.5 F (36.4 C)  TempSrc:  Oral Oral Oral  Resp: 13 16 16 18   Height:      Weight:      SpO2: 97% 100% 97% 99%   Physical Exam: BP 94/58  Pulse 78  Temp(Src) 97.5 F (36.4 C) (Oral)  Resp 18  Ht 5\' 9"  (1.753 m)  Wt 123 lb 7.3 oz (56 kg)  BMI 18.23 kg/m2  SpO2 99%  General Appearance:    Alert, cooperative, no distress, appears stated age  Head:    Normocephalic, without obvious abnormality, atraumatic  Eyes:    PERRL, conjunctiva/corneas clear, EOM's intact, fundi    benign, both eyes       Ears:    Normal TM's and external ear canals, both ears  Nose:   Nares normal, septum midline, mucosa normal, no drainage    or sinus tenderness  Throat:   Lips, mucosa, and tongue normal; teeth and gums normal  Neck:   Supple, symmetrical, trachea midline, no adenopathy;       thyroid:  No enlargement/tenderness/nodules; no carotid   bruit or JVD  Back:     Symmetric, no curvature, ROM normal, no CVA tenderness  Lungs:     Clear to auscultation bilaterally, respirations unlabored  Chest wall:    No tenderness or deformity  Heart:    Regular rate and rhythm, S1 and S2 normal, no murmur, rub   or gallop  Abdomen:     Soft, non-tender, bowel sounds active all four quadrants,    no masses, hepatomegaly present.  Genitalia:    Normal male without lesion, discharge or tenderness  Rectal:    Normal tone, normal prostate, no masses or tenderness;   guaiac negative stool  Extremities:    Extremities normal, atraumatic, no cyanosis or edema  Pulses:   2+ and symmetric all extremities  Skin:   Skin color, texture, turgor normal, no rashes or lesions  Lymph nodes:   Cervical, supraclavicular, and axillary nodes normal  Neurologic:   CNII-XII intact. Normal strength, sensation and reflexes      throughout   Lab Results: Reviewed and documented in Electronic Record Micro Results: Reviewed and documented in Electronic Record Studies/Results: Reviewed and documented in Electronic Record Medications: I have reviewed the patient's current medications. Scheduled Meds:   . folic acid  1 mg Oral Daily  . mulitivitamin with minerals  1 tablet Oral Daily  . thiamine  100 mg Oral Daily   Or  . thiamine  100 mg Intravenous Daily  . DISCONTD: sodium chloride   Intravenous Once  . DISCONTD: pantoprazole  40 mg Oral Daily   Continuous Infusions:  PRN Meds:.alum & mag hydroxide-simeth, HYDROmorphone, LORazepam, LORazepam, ondansetron (ZOFRAN) IV, ondansetron, sodium pertechnetate, DISCONTD: diphenhydrAMINE, DISCONTD: fentaNYL, DISCONTD: midazolam Assessment/Plan: Lower GI bleed:Etiology unclear. S/p normal EGD, colonoscopy and meckel's scan. His BP are running soft. H and H stable. GI recommends capsule endoscopy as the next step. -  CBC daily - Transfuse if Hb<8. - Recheck orthostatics.   2. Alcohol Abuse - - Fatty liver infiltrates on CT scan and AST /ALT ratio, PT/INR corroborates with alcohol history. - on CIWA protocol  - Counseled on cessation   3. GERD: Protonix daily.   4. Dispo; Pending GI recs.    LOS: 3 days   Samuel [name] 05/16/2011, 11:15 AM Medical Student Daily Progress Note  Subjective: NAEON.  Patient had EGD and Colonoscopy yesterday by Dr. Marina Goodell which did not identify a specific cause for rectal bleeding presentation.  GI ordered Meckel Scan today, which was negative.  Very anxious about lack of diagnosis and pending d/c today without knowing the cause  of his bleed.  Denies SOB, CP, abdominal pain, hematochezia.  States that his intake of fluids was not very good yesterday after colonoscopy.  Objective: Vital signs in last 24 hours: Filed Vitals:   05/15/11 1130 05/15/11 1400 05/15/11 2132 05/16/11 0500  BP: 125/77 98/63 98/59  94/58  Pulse:  98 80 78  Temp:  97.5 F (36.4 C) 98.4 F (36.9 C) 97.5 F (36.4 C)  TempSrc:  Oral Oral Oral  Resp: 13 16 16 18   Height:      Weight:      SpO2: 97% 100% 97% 99%   Weight change:   Intake/Output Summary (Last 24 hours) at 05/16/11 0857 Last data filed at 05/16/11 0600  Gross per 24 hour  Intake    930 ml  Output      0 ml  Net    930 ml   Physical Exam: Vitals NOTED. General appearance: alert, cooperative Head: Normocephalic, without obvious abnormality, atraumatic  Throat: abnormal findings: OP clear, unable to protrude tongue, poor dentition  Neck: no adenopathy, no carotid bruit, supple, symmetrical, trachea midline and thyroid not enlarged, symmetric, no tenderness/mass/nodules  Lungs: clear to auscultation bilaterally  Heart: regular rate and rhythm, S1, S2 normal, no murmur, click, rub or gallop  Abdomen: abnormal findings: soft, NONTENDER to palpation in all quadrants, 2+ BS in all quadrants, hepatomegaly of three finger lengths below coastal margin noted.  no rebound tenderness or guarding  Rectal: deferred Extremities: extremities normal, atraumatic, no cyanosis or edema  Pulses: 2+ and symmetric  Skin: Skin color, texture, turgor normal. No rashes or lesions or but with good skin turgor in lower extremities  Lymph nodes: no cervical LAD appreciated  Neurologic: Grossly normal  Lab Results: Basic Metabolic Panel:  Lab 05/14/11 0981 05/13/11 0520  NA 139 137  K 4.5 4.7  CL 104 100  CO2 26 26  GLUCOSE 78 133*  BUN 6 20  CREATININE 0.70 0.69  CALCIUM 8.3* 8.8  MG -- --  PHOS -- --   Liver Function Tests:  Lab 05/14/11 0615 05/13/11 0520  AST 72* 176*  ALT 85*  155*  ALKPHOS 38* 46  BILITOT 1.8* 1.2  PROT 4.8* 6.2  ALBUMIN 3.1* 3.9    Lab 05/13/11 0520  LIPASE 51  AMYLASE --   No results found for this basename: AMMONIA:2 in the last 168 hours CBC:  Lab 05/16/11 0605 05/15/11 0627 05/13/11 1345 05/13/11 0520  WBC 5.9 5.0 -- --  NEUTROABS -- -- 5.7 9.4*  HGB 11.4* 10.8* -- --  HCT 32.8* 30.4* -- --  MCV 92.1 91.6 -- --  PLT 157 128* -- --   Hemoglobin A1C:  Lab 05/13/11 1939  HGBA1C 5.6   Coagulation:  Lab 05/13/11 0520  LABPROT 15.6*  INR 1.21  Urinalysis:  Lab 05/13/11 0512  COLORURINE YELLOW  LABSPEC 1.020  PHURINE 7.0  GLUCOSEU 250*  HGBUR NEGATIVE  BILIRUBINUR NEGATIVE  KETONESUR 40*  PROTEINUR NEGATIVE  UROBILINOGEN 0.2  NITRITE NEGATIVE  LEUKOCYTESUR NEGATIVE   Misc. Labs: None  Studies/Results: No results found. Medications: I have reviewed the patient's current medications. Scheduled Meds:    . folic acid  1 mg Oral Daily  . mulitivitamin with minerals  1 tablet Oral Daily  . thiamine  100 mg Oral Daily   Or  . thiamine  100 mg Intravenous Daily  . DISCONTD: sodium chloride   Intravenous Once  . DISCONTD: pantoprazole  40 mg Oral Daily   Continuous Infusions:    . DISCONTD: sodium chloride 75 mL/hr at 05/14/11 0902   PRN Meds:.alum & mag hydroxide-simeth, HYDROmorphone, LORazepam, LORazepam, ondansetron (ZOFRAN) IV, ondansetron, DISCONTD: diphenhydrAMINE, DISCONTD: fentaNYL, DISCONTD: midazolam Assessment/Plan:  Mr. Guerreiro presented with an active lower GI bleed. His hemoglobin had dropped to 9.1 prior to 2 units of pRBCs transfusion -> 11.4 this AM.  The etiology for the lower GI bleed could include IBS vs Meckels.  EGD/Colonoscopy were negative yesterday for cause of GI bleeding.  Meckel's Scan negative today.  GI will follow patient as outpt and will do capsule endoscopy.  Of Note, he did have low blood pressures yesterday and this AM.    1. Lower GI Bleed - No longer actively  bleeding, has rec'd 2 units to date. GI thinks this could be an AVM vs. Diverticular bleeding vs. Dieulafoy's lesion.     - GI consulted, deferring to Dr. Marina Goodell.  Thanks for recs. - CBC QAM, transfuse if Hb<8. Hb is 11.4 today after 2 units transfused Friday. - FOBT positive - Orthostatic on admission, Will repeat orthostatics after lunch today. - Protonix IV 40mg  QD PO - Morphine/Zofran prn - Consider NS bolus should the patient remain orthostatic - HIV negative  2. Alcohol Abuse - Mr. Curington has a history of alcohol abuse for 8-9 years where he states he has averaged 6 12oz beers/day. He states he had 3 yesterday and has not been drinking more or less than usual. His MCV is 93.7, AST/ALT elevated at 176 and 155 on admission. Tbili is 1.2, alk phos is 46 on admission. PT slightly elevated 15.6, INR 1.21. Urine has glucose of 250 and Ketones of 40. - on CIWA protocol - Will counsel on alcoholism - Fatty liver infiltrates on CT scan, but hepatomegaly appreciated. Lipase 51. - A1c 5.6.  3. GERD - Has a history of dysphagia and heartburn. Pt states he takes Zantac 2-4 times daily. Dysphagia may be due to previously diagnosed hiatal hernia.  - Protonix 40mg  PO QD  4. Diet/Fluids - Regular Diet  5. VTE - SCDs  6. Disposition - Pending GI recs, will d/c today as patient is medically stable, H+H stable.   LOS: 3 days   This is a Psychologist, occupational Note.  The care of the patient was discussed with Dr. Dorthula Rue and the assessment and plan formulated with their assistance.  Please see their attached note for official documentation of the daily encounter.  Allena Katz, Samuel [name] 05/16/2011, 8:57 AM

## 2011-05-16 NOTE — Progress Notes (Signed)
I agree with the above documentation, including the assessment and plan. Recent GI bleed with resultant anemia, source not found by EGD/colon/Meckel's scan. Plan is d/c today, office f/u this week.  Likely will perform VCE as outpt. Path pending from rectal polyp.

## 2011-05-17 ENCOUNTER — Encounter: Payer: Self-pay | Admitting: Internal Medicine

## 2011-05-18 ENCOUNTER — Other Ambulatory Visit: Payer: Self-pay | Admitting: *Deleted

## 2011-05-18 DIAGNOSIS — Z8719 Personal history of other diseases of the digestive system: Secondary | ICD-10-CM

## 2011-05-19 ENCOUNTER — Telehealth: Payer: Self-pay | Admitting: *Deleted

## 2011-05-19 DIAGNOSIS — K922 Gastrointestinal hemorrhage, unspecified: Secondary | ICD-10-CM

## 2011-05-19 DIAGNOSIS — K921 Melena: Secondary | ICD-10-CM

## 2011-05-19 NOTE — Telephone Encounter (Signed)
Patient came for teaching for small bowel capsule endoscopy. He states he has difficulty swallowing pills of any kind and he does not think he can swallow the capsule. He states he would try but seriously doubted he could swallow it. Please, advise.

## 2011-05-22 NOTE — Telephone Encounter (Signed)
No physical reason that he can't swallow the capsule. He is an anxious person. Important that he completes this

## 2011-05-23 ENCOUNTER — Telehealth: Payer: Self-pay | Admitting: *Deleted

## 2011-05-23 NOTE — Telephone Encounter (Signed)
Spoke with Tobi Bastos and capsule endo's have to be done at [facility]. Spoke with Rose at [facility] and scheduled patient at [facility] on 05/30/11 at 8:00 AM. Case number [phone]

## 2011-05-25 ENCOUNTER — Encounter: Payer: Self-pay | Admitting: *Deleted

## 2011-05-25 ENCOUNTER — Telehealth: Payer: Self-pay | Admitting: *Deleted

## 2011-05-25 NOTE — Telephone Encounter (Signed)
Yesterday patient's wife stated he would come today to pick up instructions for capsule endo. Called and left a message today asking him to call me. Dr. Marina Goodell notified and letter mailed to patient.

## 2011-05-25 NOTE — Telephone Encounter (Signed)
Spoke with Venita Sheffield the nurse for Dr. Bosie Clos at Internal [facility] at [facility]. Patient has an OV scheduled there tomorrow. She will remind patient that he needs to pick up instructions for capsule endo. She will let Dr. Bosie Clos know we are trying to get him to have this test.

## 2011-05-26 ENCOUNTER — Encounter: Payer: Self-pay | Admitting: Internal Medicine

## 2011-05-26 NOTE — Telephone Encounter (Signed)
Spoke with patient's caregiver and she states the whole family has had a stomach virus. She will try to get her tomorrow if they are feeling better.

## 2011-05-27 ENCOUNTER — Telehealth: Payer: Self-pay | Admitting: *Deleted

## 2011-05-27 NOTE — Telephone Encounter (Signed)
Patient came for information about capsule endo that is scheduled on 05/30/11 at [facility]. Patient had been given information/teaching on 05/20/11 but was not sure he could swallow the capsule. Reviewed information with patient and handout given.

## 2011-05-27 NOTE — Telephone Encounter (Signed)
Spoke with patient's wife and he will come today for information

## 2011-05-30 ENCOUNTER — Encounter (HOSPITAL_COMMUNITY)
Admission: RE | Disposition: A | Discharge status: Home / Self Care | Payer: Self-pay | Source: Ambulatory Visit | Attending: Internal Medicine

## 2011-05-30 ENCOUNTER — Ambulatory Visit (HOSPITAL_COMMUNITY)
Admission: RE | Admit: 2011-05-30 | Discharge: 2011-05-30 | Disposition: A | Discharge status: Home / Self Care | Payer: Self-pay | Source: Ambulatory Visit | Attending: Internal Medicine | Admitting: Internal Medicine

## 2011-05-30 DIAGNOSIS — K625 Hemorrhage of anus and rectum: Secondary | ICD-10-CM | POA: Insufficient documentation

## 2011-05-30 DIAGNOSIS — K922 Gastrointestinal hemorrhage, unspecified: Secondary | ICD-10-CM

## 2011-05-30 HISTORY — PX: GIVENS CAPSULE STUDY: SHX5432

## 2011-05-30 SURGERY — IMAGING PROCEDURE, GI TRACT, INTRALUMINAL, VIA CAPSULE
Anesthesia: LOCAL

## 2011-05-30 SURGICAL SUPPLY — 1 items: TOWEL COTTON PACK 4EA (MISCELLANEOUS) ×4 IMPLANT

## 2011-05-31 ENCOUNTER — Encounter (HOSPITAL_COMMUNITY): Payer: Self-pay | Admitting: Internal Medicine

## 2011-06-06 ENCOUNTER — Telehealth: Payer: Self-pay | Admitting: *Deleted

## 2011-06-06 DIAGNOSIS — D649 Anemia, unspecified: Secondary | ICD-10-CM

## 2011-06-06 NOTE — Telephone Encounter (Signed)
Message copied by Daphine Deutscher on Mon Jun 06, 2011  3:46 PM ------      Message from: Hilarie Fredrickson      Created: Mon Jun 06, 2011  8:16 AM      Regarding: capsule endoscopy       Let pt know that preliminary reading of the capsule endoscopy was negative (normal). Have him get a cbc in 2 weeks and follow up in the office in 3-4 weeks

## 2011-06-06 NOTE — Telephone Encounter (Signed)
Left message for patient to call me back. 

## 2011-06-07 ENCOUNTER — Encounter: Payer: Self-pay | Admitting: *Deleted

## 2011-06-07 NOTE — Telephone Encounter (Signed)
Phone has been temporarily disconnected. Letter mailed to patient with information per Dr. Marina Goodell and the need to call to schedule labs and OV.

## 2011-06-15 ENCOUNTER — Encounter: Payer: Self-pay | Admitting: Internal Medicine

## 2011-06-24 ENCOUNTER — Other Ambulatory Visit (INDEPENDENT_AMBULATORY_CARE_PROVIDER_SITE_OTHER): Payer: Self-pay

## 2011-06-24 DIAGNOSIS — D649 Anemia, unspecified: Secondary | ICD-10-CM

## 2011-06-24 LAB — CBC WITH DIFFERENTIAL/PLATELET
Basophils Absolute: 0 10*3/uL (ref 0.0–0.1)
Basophils Relative: 0.2 % (ref 0.0–3.0)
HCT: 41 % (ref 39.0–52.0)
Hemoglobin: 13.9 g/dL (ref 13.0–17.0)
Lymphocytes Relative: 23.6 % (ref 12.0–46.0)
Lymphs Abs: 1.2 10*3/uL (ref 0.7–4.0)
Monocytes Relative: 11 % (ref 3.0–12.0)
Neutro Abs: 3.4 10*3/uL (ref 1.4–7.7)
RBC: 4.32 Mil/uL (ref 4.22–5.81)
RDW: 13.4 % (ref 11.5–14.6)

## 2011-07-11 ENCOUNTER — Emergency Department (HOSPITAL_COMMUNITY)
Admission: EM | Admit: 2011-07-11 | Discharge: 2011-07-12 | Disposition: A | Discharge status: Home / Self Care | Payer: Self-pay | Attending: Emergency Medicine | Admitting: Emergency Medicine

## 2011-07-11 ENCOUNTER — Encounter (HOSPITAL_COMMUNITY): Payer: Self-pay | Admitting: Emergency Medicine

## 2011-07-11 DIAGNOSIS — R197 Diarrhea, unspecified: Secondary | ICD-10-CM | POA: Insufficient documentation

## 2011-07-11 DIAGNOSIS — R5381 Other malaise: Secondary | ICD-10-CM | POA: Insufficient documentation

## 2011-07-11 DIAGNOSIS — R110 Nausea: Secondary | ICD-10-CM | POA: Insufficient documentation

## 2011-07-11 DIAGNOSIS — Z79899 Other long term (current) drug therapy: Secondary | ICD-10-CM | POA: Insufficient documentation

## 2011-07-11 DIAGNOSIS — E860 Dehydration: Secondary | ICD-10-CM | POA: Insufficient documentation

## 2011-07-11 DIAGNOSIS — R42 Dizziness and giddiness: Secondary | ICD-10-CM | POA: Insufficient documentation

## 2011-07-11 DIAGNOSIS — R000 Tachycardia, unspecified: Secondary | ICD-10-CM | POA: Insufficient documentation

## 2011-07-11 LAB — CBC
HCT: 42.7 % (ref 39.0–52.0)
Hemoglobin: 15.3 g/dL (ref 13.0–17.0)
MCH: 32.3 pg (ref 26.0–34.0)
MCHC: 35.8 g/dL (ref 30.0–36.0)
MCV: 90.1 fL (ref 78.0–100.0)
RDW: 13 % (ref 11.5–15.5)

## 2011-07-11 LAB — URINALYSIS, ROUTINE W REFLEX MICROSCOPIC
Bilirubin Urine: NEGATIVE
Ketones, ur: >80 mg/dL — AB
Nitrite: NEGATIVE
Specific Gravity, Urine: 1.017 (ref 1.005–1.030)
Urobilinogen, UA: 0.2 mg/dL (ref 0.0–1.0)

## 2011-07-11 LAB — DIFFERENTIAL
Basophils Absolute: 0 10*3/uL (ref 0.0–0.1)
Basophils Relative: 0 % (ref 0–1)
Eosinophils Absolute: 0 10*3/uL (ref 0.0–0.7)
Eosinophils Relative: 0 % (ref 0–5)
Monocytes Absolute: 0.5 10*3/uL (ref 0.1–1.0)
Monocytes Relative: 4 % (ref 3–12)
Neutro Abs: 12 10*3/uL — ABNORMAL HIGH (ref 1.7–7.7)

## 2011-07-11 LAB — BASIC METABOLIC PANEL
BUN: 7 mg/dL (ref 6–23)
Calcium: 9.6 mg/dL (ref 8.4–10.5)
Chloride: 95 mEq/L — ABNORMAL LOW (ref 96–112)
Creatinine, Ser: 0.82 mg/dL (ref 0.50–1.35)
GFR calc Af Amer: >90 mL/min (ref >90)
GFR calc non Af Amer: >90 mL/min (ref >90)

## 2011-07-11 LAB — GLUCOSE, CAPILLARY: Glucose-Capillary: 89 mg/dL (ref 70–99)

## 2011-07-11 MED ORDER — SODIUM CHLORIDE 0.9 % IV BOLUS (SEPSIS)
1000.0000 mL | Freq: Once | INTRAVENOUS | Status: AC
  Administered 2011-07-12: 1000 mL via INTRAVENOUS

## 2011-07-11 NOTE — ED Notes (Signed)
Called lab to follow up on delay getting BMET

## 2011-07-11 NOTE — ED Notes (Signed)
PT. REPORTS GENERALIZED WEAKNESS WITH NAUSEA , DIARRHEA AND LIGHTHEADED FOR SEVERAL DAYS .

## 2011-07-11 NOTE — ED Provider Notes (Signed)
History     CSN: [id number]  Arrival date & time 07/11/11  2134   First MD Initiated Contact with Patient 07/11/11 2340      Chief Complaint  Patient presents with  . Weakness    (Consider location/radiation/quality/duration/timing/severity/associated sxs/prior treatment) HPI Comments: Onset of generalized weakness and fatigue with some nausea and diarrhea associated with it.  This started at 4 PM.  Worse with walking - light headed with same.  Has been able to tolerate some gatorade.  The BM today was looser than normal.  No blood in stool.  Has had some fast heart rate today as well.  VS showed mild tachycardia on arrival.  Hx of liver damage from ETOH use which was associated with "internal bleeding".  Denies hx of SOB, cough, fevers, ha or other c/o.  Sx were intermittent, have now resolved, worse with standing.  Patient is a 31 y.o. male presenting with weakness. The history is provided by the patient and the spouse.  Weakness  Additional symptoms include weakness.    Past Medical History  Diagnosis Date  . GERD (gastroesophageal reflux disease)   . H/O hiatal hernia     pt states he had an EGD at age 79 and had a hiatal hernia  . Dysphagia   . Diverticulitis of colon with bleeding 2010    Hospitalized in 2010 for "intestinal infection requiring IV antibiotics"    Past Surgical History  Procedure Date  . Tonsillectomy   . Esophagogastroduodenoscopy     age 65  . Colonoscopy 05/15/2011    Procedure: COLONOSCOPY;  Surgeon: Hilarie Fredrickson, MD;  Location: [facility] ENDOSCOPY;  Service: Endoscopy;  Laterality: N/A;  . Esophagogastroduodenoscopy 05/15/2011    Procedure: ESOPHAGOGASTRODUODENOSCOPY (EGD);  Surgeon: Hilarie Fredrickson, MD;  Location: [facility] ENDOSCOPY;  Service: Endoscopy;  Laterality: N/A;  . Givens capsule study 05/30/2011    Procedure: GIVENS CAPSULE STUDY;  Surgeon: Hilarie Fredrickson, MD;  Location: [facility] ENDOSCOPY;  Service: Endoscopy;  Laterality: N/A;  Givens Capsule study.     Family History  Problem Relation Age of Onset  . Diabetes type II Mother   . Emphysema Father   . Heart attack Father   . Stroke Father   . Diabetes type II Other   . Diabetes type II Maternal Aunt     History  Substance Use Topics  . Smoking status: Never Smoker   . Smokeless tobacco: Current User    Types: Chew   Comment: States he has been chewing tobacco almost daily since age 78  . Alcohol Use: Yes     average six 12oz beers/day, ranges from 3-12 12oz/day      Review of Systems  Neurological: Positive for weakness.  All other systems reviewed and are negative.    Allergies  Review of patient's allergies indicates no known allergies.  Home Medications   Current Outpatient Rx  Name Route Sig Dispense Refill  . RANITIDINE HCL 150 MG PO TABS Oral Take 150 mg by mouth daily as needed. For heartburn    . PROMETHAZINE HCL 25 MG PO TABS Oral Take 1 tablet (25 mg total) by mouth every 6 (six) hours as needed for nausea. 12 tablet 0    BP 117/76  Pulse 89  Temp(Src) 97.8 F (36.6 C) (Oral)  Resp 18  SpO2 97%  Physical Exam  Nursing note and vitals reviewed. Constitutional: He appears well-developed and well-nourished. No distress.  HENT:  Head: Normocephalic and atraumatic.  Mouth/Throat: No oropharyngeal exudate.  MM dehdyrated  Eyes: Conjunctivae and EOM are normal. Pupils are equal, round, and reactive to light. Right eye exhibits no discharge. Left eye exhibits no discharge. No scleral icterus.  Neck: Normal range of motion. Neck supple. No JVD present. No thyromegaly present.  Cardiovascular: Normal rate, regular rhythm, normal heart sounds and intact distal pulses.  Exam reveals no gallop and no friction rub.   No murmur heard. Pulmonary/Chest: Effort normal and breath sounds normal. No respiratory distress. He has no wheezes. He has no rales.  Abdominal: Soft. Bowel sounds are normal. He exhibits no distension and no mass. There is no tenderness.   Musculoskeletal: Normal range of motion. He exhibits no edema and no tenderness.  Lymphadenopathy:    He has no cervical adenopathy.  Neurological: He is alert. Coordination normal.       Neurologic exam:  Speech clear, pupils equal round reactive to light, extraocular movements intact  Normal peripheral visual fields Cranial nerves III through XII normal including no facial droop Follows commands, moves all extremities x4, normal strength to bilateral upper and lower extremities at all major muscle groups including grip Sensation normal to light touch and pinprick Coordination intact, no limb ataxia, finger-nose-finger normal Rapid alternating movements normal No pronator drift Gait normal   Skin: Skin is warm and dry. No rash noted. No erythema.  Psychiatric: He has a normal mood and affect. His behavior is normal.    ED Course  Procedures (including critical care time)  Labs Reviewed  CBC - Abnormal; Notable for the following:    WBC 13.7 (*)    All other components within normal limits  DIFFERENTIAL - Abnormal; Notable for the following:    Neutrophils Relative 88 (*)    Neutro Abs 12.0 (*)    Lymphocytes Relative 8 (*)    All other components within normal limits  BASIC METABOLIC PANEL - Abnormal; Notable for the following:    Chloride 95 (*)    Glucose, Bld 106 (*)    All other components within normal limits  URINALYSIS, ROUTINE W REFLEX MICROSCOPIC - Abnormal; Notable for the following:    Hgb urine dipstick TRACE (*)    Ketones, ur >80 (*)    All other components within normal limits  ETHANOL - Abnormal; Notable for the following:    Alcohol, Ethyl (B) 208 (*)    All other components within normal limits  GLUCOSE, CAPILLARY  URINE MICROSCOPIC-ADD ON  HEPATIC FUNCTION PANEL  APTT  PROTIME-INR   No results found.   1. Dehydration   2. Nausea       MDM  Hx of increased ETOH last few days, dehydrated on exam, need fluids, has increasecd ketones in the  urine, normal BMP drawn by nursing, and CBC without anemia, check LFT's.  Rehydrated, anti nausea meds.  Orthostatic on my exam.    Zoloft was prescribed for depression, but hasn't started taking it yet - doesn't want to be on medicines.  Drinks to get away from depression / anxiety and stress as well.  Laboratory workup reveals no significant abnormalities other than alcohol level of 208, normal liver function, normal coagulopathy panel, urinalysis with greater than 80 ketones, normal renal function, slight leukocytosis.  These were discussed with the patient, he has been hydrated with IV fluids and is improved and tolerating PO fluids. I have encouraged him to followup with his family doctor, he states that he does not have one. I have given him a followup list with her family  Dr. and mental health professionals to address both his depression problems and medical problems.    Discharge Prescriptions include:  Phenergan   Vida Roller, MD 07/12/11 [phone]

## 2011-07-12 LAB — HEPATIC FUNCTION PANEL
ALT: 13 U/L (ref 0–53)
Alkaline Phosphatase: 60 U/L (ref 39–117)
Indirect Bilirubin: 0.3 mg/dL (ref 0.3–0.9)
Total Bilirubin: 0.4 mg/dL (ref 0.3–1.2)
Total Protein: 6.9 g/dL (ref 6.0–8.3)

## 2011-07-12 LAB — ETHANOL: Alcohol, Ethyl (B): 208 mg/dL — ABNORMAL HIGH (ref 0–11)

## 2011-07-12 MED ORDER — PROMETHAZINE HCL 25 MG PO TABS: 25.0000 mg | ORAL_TABLET | Freq: Four times a day (QID) | ORAL | Status: DC | PRN

## 2011-07-12 NOTE — Discharge Instructions (Signed)
You're tests in the hospital have shown significant dehydration.  We have given you IV fluids and medicines for nausea which seem to have improved your symptoms.  If you need ongoing medication at home for nausea, use the Phenergan as prescribed, drink plenty of fluids and follow a brat diet.  Regarding your depression, please start taking the medication prescribed by your family doctor under their care. If you do not have a family doctor to followup with please see the list of physicians below. He may also find some help with substance abuse or addiction with some of the mental health providers below.  Return to the hospital immediately for severe or worsening nausea, dehydration or if you have a significant change in her symptoms.  RESOURCE GUIDE  Dental Problems  Patients with Medicaid: [facility] [phone] [address]                                           [phone] Phone:  [phone]                                                  Phone:  [phone]  If unable to pay or uninsured, contact:  [facility] or [facility]. to become qualified for the adult dental clinic.  Chronic Pain Problems Contact [facility]  [phone] Patients need to be referred by their primary care doctor.  Insufficient Money for Medicine Contact United Way:  call "211" or [facility] ([phone].  No Primary Care Doctor Call [facility]  [phone] Other agencies that provide inexpensive medical care    [facility]  [phone]    [facility]  [phone]    [facility]  [phone]    [facility]  [phone]    [facility]  [phone]    [facility] Clinic  [phone]  Psychological Services [facility] Behavioral Health  [phone] [facility] Services  [phone] [facility] Mental Health   [phone] (emergency services [phone])  Substance Abuse  Resources Alcohol and Drug Services  [phone] [facility] [phone] [facility] [phone] Floydene Flock [phone] Residential & Outpatient Substance Abuse Program  [phone]  Abuse/Neglect [facility] Child Abuse Hotline (620)298-1245 [facility] Child Abuse Hotline 4012646107 (After Hours)  Emergency Shelter [facility] [phone]  Maternity Homes [facility] [place] [facility] [phone] [facility] [phone]  MRSA Hotline #:   262-497-5059    [facility] Resources  [facility] [place]     United Way                          [facility] Dept. [address] [place]                       [address]      [address]  [address]  Sela Hua Phone:  [phone]                                   Phone:  [phone]                 Phone:  [place] Phone:  [place] [phone] (After Hours)

## 2011-07-19 ENCOUNTER — Ambulatory Visit: Payer: Self-pay | Admitting: Internal Medicine

## 2011-08-01 ENCOUNTER — Encounter (HOSPITAL_COMMUNITY): Payer: Self-pay | Admitting: *Deleted

## 2011-08-01 ENCOUNTER — Emergency Department (HOSPITAL_COMMUNITY)
Admission: EM | Admit: 2011-08-01 | Discharge: 2011-08-01 | Disposition: A | Discharge status: Home / Self Care | Payer: Self-pay | Attending: Emergency Medicine | Admitting: Emergency Medicine

## 2011-08-01 DIAGNOSIS — K219 Gastro-esophageal reflux disease without esophagitis: Secondary | ICD-10-CM | POA: Insufficient documentation

## 2011-08-01 DIAGNOSIS — E872 Acidosis, unspecified: Secondary | ICD-10-CM | POA: Insufficient documentation

## 2011-08-01 DIAGNOSIS — Z79899 Other long term (current) drug therapy: Secondary | ICD-10-CM | POA: Insufficient documentation

## 2011-08-01 DIAGNOSIS — R112 Nausea with vomiting, unspecified: Secondary | ICD-10-CM | POA: Insufficient documentation

## 2011-08-01 DIAGNOSIS — R51 Headache: Secondary | ICD-10-CM | POA: Insufficient documentation

## 2011-08-01 DIAGNOSIS — R1110 Vomiting, unspecified: Secondary | ICD-10-CM

## 2011-08-01 DIAGNOSIS — R000 Tachycardia, unspecified: Secondary | ICD-10-CM | POA: Insufficient documentation

## 2011-08-01 LAB — COMPREHENSIVE METABOLIC PANEL
Albumin: 5.1 g/dL (ref 3.5–5.2)
Alkaline Phosphatase: 64 U/L (ref 39–117)
BUN: 13 mg/dL (ref 6–23)
CO2: 17 mEq/L — ABNORMAL LOW (ref 19–32)
Chloride: 95 mEq/L — ABNORMAL LOW (ref 96–112)
GFR calc Af Amer: >90 mL/min (ref >90)
GFR calc non Af Amer: >90 mL/min (ref >90)
Glucose, Bld: 93 mg/dL (ref 70–99)
Potassium: 4.9 mEq/L (ref 3.5–5.1)
Total Bilirubin: 0.6 mg/dL (ref 0.3–1.2)

## 2011-08-01 LAB — CBC
HCT: 45 % (ref 39.0–52.0)
Hemoglobin: 15.2 g/dL (ref 13.0–17.0)
RBC: 4.75 MIL/uL (ref 4.22–5.81)

## 2011-08-01 LAB — URINALYSIS, ROUTINE W REFLEX MICROSCOPIC
Glucose, UA: NEGATIVE mg/dL
Leukocytes, UA: NEGATIVE
Nitrite: NEGATIVE
Specific Gravity, Urine: 1.021 (ref 1.005–1.030)
pH: 5.5 (ref 5.0–8.0)

## 2011-08-01 LAB — DIFFERENTIAL
Lymphocytes Relative: 4 % — ABNORMAL LOW (ref 12–46)
Lymphs Abs: 0.5 10*3/uL — ABNORMAL LOW (ref 0.7–4.0)
Monocytes Absolute: 0.3 10*3/uL (ref 0.1–1.0)
Monocytes Relative: 2 % — ABNORMAL LOW (ref 3–12)
Neutro Abs: 12.7 10*3/uL — ABNORMAL HIGH (ref 1.7–7.7)
Neutrophils Relative %: 94 % — ABNORMAL HIGH (ref 43–77)

## 2011-08-01 LAB — URINE MICROSCOPIC-ADD ON

## 2011-08-01 MED ORDER — SODIUM CHLORIDE 0.9 % IV BOLUS (SEPSIS)
1000.0000 mL | Freq: Once | INTRAVENOUS | Status: AC
  Administered 2011-08-01: 1000 mL via INTRAVENOUS

## 2011-08-01 MED ORDER — SODIUM CHLORIDE 0.9 % IV BOLUS (SEPSIS): 1000.0000 mL | Freq: Once | INTRAVENOUS | Status: DC

## 2011-08-01 MED ORDER — FOLIC ACID 1 MG PO TABS
1.0000 mg | ORAL_TABLET | Freq: Once | ORAL | Status: AC
  Administered 2011-08-01: 1 mg via ORAL
  Filled 2011-08-01: qty 1

## 2011-08-01 MED ORDER — VITAMIN B-1 100 MG PO TABS
100.0000 mg | ORAL_TABLET | Freq: Every day | ORAL | Status: DC
  Administered 2011-08-01: 100 mg via ORAL
  Filled 2011-08-01 (×2): qty 1

## 2011-08-01 MED ORDER — ONDANSETRON HCL 4 MG/2ML IJ SOLN
4.0000 mg | Freq: Once | INTRAMUSCULAR | Status: AC
  Administered 2011-08-01: 4 mg via INTRAVENOUS
  Filled 2011-08-01: qty 2

## 2011-08-01 MED ORDER — ONDANSETRON HCL 4 MG PO TABS: 4.0000 mg | ORAL_TABLET | Freq: Four times a day (QID) | ORAL | Status: AC

## 2011-08-01 MED ORDER — TAB-A-VITE/IRON PO TABS: 1.0000 | ORAL_TABLET | Freq: Every day | ORAL | Status: DC

## 2011-08-01 NOTE — ED Provider Notes (Signed)
I saw and evaluated the patient, reviewed the resident's note and I agree with the findings and plan.  I saw the patient along with Dr. Maisie Fus and agree with his note, assessment, and plan.  The patient presents complaining of nausea and vomiting for the past two days.  He recently started drinking again and has not had much food intake in this time.  On exam, the vitals are stable and the patient is afebrile.  The heart and lung exam are unremarkable and the abdomen is benign.  He was given ivf's.  Laboratory studies show a CO2 of 16.  He will be moved to the CDU for iv hydration, discharge once feeling better.  Geoffery Lyons, MD 08/01/11 2322

## 2011-08-01 NOTE — Discharge Instructions (Signed)
Alcohol Problems Most adults who drink alcohol drink in moderation (not a lot) are at low risk for developing problems related to their drinking. However, all drinkers, including low-risk drinkers, should know about the health risks connected with drinking alcohol. RECOMMENDATIONS FOR LOW-RISK DRINKING  Drink in moderation. Moderate drinking is defined as follows:   Men - no more than 2 drinks per day.   Nonpregnant women - no more than 1 drink per day.   Over age 33 - no more than 1 drink per day.  A standard drink is 12 grams of pure alcohol, which is equal to a 12 ounce bottle of beer or wine cooler, a 5 ounce glass of wine, or 1.5 ounces of distilled spirits (such as whiskey, brandy, vodka, or rum).  ABSTAIN FROM (DO NOT DRINK) ALCOHOL:  When pregnant or considering pregnancy.   When taking a medication that interacts with alcohol.   If you are alcohol dependent.   A medical condition that prohibits drinking alcohol (such as ulcer, liver disease, or heart disease).  DISCUSS WITH YOUR CAREGIVER:  If you are at risk for coronary heart disease, discuss the potential benefits and risks of alcohol use: Light to moderate drinking is associated with lower rates of coronary heart disease in certain populations (for example, men over age 33 and postmenopausal women). Infrequent or nondrinkers are advised not to begin light to moderate drinking to reduce the risk of coronary heart disease so as to avoid creating an alcohol-related problem. Similar protective effects can likely be gained through proper diet and exercise.   Women and the elderly have smaller amounts of body water than men. As a result women and the elderly achieve a higher blood alcohol concentration after drinking the same amount of alcohol.   Exposing a fetus to alcohol can cause a broad range of birth defects referred to as Fetal Alcohol Syndrome (FAS) or Alcohol-Related Birth Defects (ARBD). Although FAS/ARBD is connected with  excessive alcohol consumption during pregnancy, studies also have reported neurobehavioral problems in infants born to mothers reporting drinking an average of 1 drink per day during pregnancy.   Heavier drinking (the consumption of more than 4 drinks per occasion by men and more than 3 drinks per occasion by women) impairs learning (cognitive) and psychomotor functions and increases the risk of alcohol-related problems, including accidents and injuries.  CAGE QUESTIONS:   Have you ever felt that you should Cut down on your drinking?   Have people Annoyed you by criticizing your drinking?   Have you ever felt bad or Guilty about your drinking?   Have you ever had a drink first thing in the morning to steady your nerves or get rid of a hangover (Eye opener)?  If you answered positively to any of these questions: You may be at risk for alcohol-related problems if alcohol consumption is:   Men: Greater than 14 drinks per week or more than 4 drinks per occasion.   Women: Greater than 7 drinks per week or more than 3 drinks per occasion.  Do you or your family have a medical history of alcohol-related problems, such as:  Blackouts.   Sexual dysfunction.   Depression.   Trauma.   Liver dysfunction.   Sleep disorders.   Hypertension.   Chronic abdominal pain.   Has your drinking ever caused you problems, such as problems with your family, problems with your work (or school) performance, or accidents/injuries?   Do you have a compulsion to drink or a preoccupation  with drinking?   Do you have poor control or are you unable to stop drinking once you have started?   Do you have to drink to avoid withdrawal symptoms?   Do you have problems with withdrawal such as tremors, nausea, sweats, or mood disturbances?   Does it take more alcohol than in the past to get you high?   Do you feel a strong urge to drink?   Do you change your plans so that you can have a drink?   Do you ever  drink in the morning to relieve the shakes or a hangover?  If you have answered a number of the previous questions positively, it may be time for you to talk to your caregivers, family, and friends and see if they think you have a problem. Alcoholism is a chemical dependency that keeps getting worse and will eventually destroy your health and relationships. Many alcoholics end up dead, impoverished, or in prison. This is often the end result of all chemical dependency.  Do not be discouraged if you are not ready to take action immediately.   Decisions to change behavior often involve up and down desires to change and feeling like you cannot decide.   Try to think more seriously about your drinking behavior.   Think of the reasons to quit.  WHERE TO GO FOR ADDITIONAL INFORMATION   The National Institute on Alcohol Abuse and Alcoholism (NIAAA)www.niaaa.nih.gov   [employer] on Alcoholism and Drug Dependence (NCADD)www.ncadd.org   American Society of Addiction Medicine (ASAM)www.https://anderson-johnson.com/  Document Released: 02/21/2005 Document Revised: 02/10/2011 Document Reviewed: 10/10/2007 [facility] Patient Information 2012 [place], [place].Dehydration, Adult Dehydration is when you lose more fluids from the body than you take in. Vital organs like the kidneys, brain, and heart cannot function without a proper amount of fluids and salt. Any loss of fluids from the body can cause dehydration.  CAUSES   Vomiting.   Diarrhea.   Excessive sweating.   Excessive urine output.   Fever.  SYMPTOMS  Mild dehydration  Thirst.   Dry lips.   Slightly dry mouth.  Moderate dehydration  Very dry mouth.   Sunken eyes.   Skin does not bounce back quickly when lightly pinched and released.   Dark urine and decreased urine production.   Decreased tear production.   Headache.  Severe dehydration  Very dry mouth.   Extreme thirst.   Rapid, weak pulse (more than 100 beats per minute at rest).     Cold hands and feet.   Not able to sweat in spite of heat and temperature.   Rapid breathing.   Blue lips.   Confusion and lethargy.   Difficulty being awakened.   Minimal urine production.   No tears.  DIAGNOSIS  Your caregiver will diagnose dehydration based on your symptoms and your exam. Blood and urine tests will help confirm the diagnosis. The diagnostic evaluation should also identify the cause of dehydration. TREATMENT  Treatment of mild or moderate dehydration can often be done at home by increasing the amount of fluids that you drink. It is best to drink small amounts of fluid more often. Drinking too much at one time can make vomiting worse. Refer to the home care instructions below. Severe dehydration needs to be treated at the hospital where you will probably be given intravenous (IV) fluids that contain water and electrolytes. HOME CARE INSTRUCTIONS   Ask your caregiver about specific rehydration instructions.   Drink enough fluids to keep your urine clear or  pale yellow.   Drink small amounts frequently if you have nausea and vomiting.   Eat as you normally do.   Avoid:   Foods or drinks high in sugar.   Carbonated drinks.   Juice.   Extremely hot or cold fluids.   Drinks with caffeine.   Fatty, greasy foods.   Alcohol.   Tobacco.   Overeating.   Gelatin desserts.   Wash your hands well to avoid spreading bacteria and viruses.   Only take over-the-counter or prescription medicines for pain, discomfort, or fever as directed by your caregiver.   Ask your caregiver if you should continue all prescribed and over-the-counter medicines.   Keep all follow-up appointments with your caregiver.  SEEK MEDICAL CARE IF:  You have abdominal pain and it increases or stays in one area (localizes).   You have a rash, stiff neck, or severe headache.   You are irritable, sleepy, or difficult to awaken.   You are weak, dizzy, or extremely thirsty.   SEEK IMMEDIATE MEDICAL CARE IF:   You are unable to keep fluids down or you get worse despite treatment.   You have frequent episodes of vomiting or diarrhea.   You have blood or green matter (bile) in your vomit.   You have blood in your stool or your stool looks black and tarry.   You have not urinated in 6 to 8 hours, or you have only urinated a small amount of very dark urine.   You have a fever.   You faint.  MAKE SURE YOU:   Understand these instructions.   Will watch your condition.   Will get help right away if you are not doing well or get worse.  Document Released: 02/21/2005 Document Revised: 02/10/2011 Document Reviewed: 10/11/2010 [facility] Patient Information 2012 [place], [place].   RESOURCE GUIDE  Dental Problems  Patients with Medicaid: [facility]                     [phone] [address]                                           Phone:  [phone]                                                  If unable to pay or uninsured, contact:  [facility] or [facility]. to become qualified for the adult dental clinic.  Chronic Pain Problems Contact [facility]  [phone] Patients need to be referred by their primary care doctor.  Insufficient Money for Medicine Contact United Way:  call "211" or [facility] [phone].  No Primary Care Doctor Call [facility]  [phone] Other agencies that provide inexpensive medical care    [facility]  [phone]    [facility] Internal Medicine  [phone]    [facility]  [phone]    [facility]  [phone]    [facility]  [phone]    [facility] Child Clinic  [phone]  Substance Abuse Resources Alcohol and Drug Services  ([phone] [facility] [phone] [facility] [place] [phone] Floydene Flock [phone] Residential & Outpatient Substance Abuse Program  [phone]  Psychological Services  [facility]  Behavioral Health  [phone] [facility]  [phone] [facility] Mental Health   [phone] (emergency services [phone])  Abuse/Neglect [facility] Child Abuse Hotline 716-606-0902 [facility] Child Abuse Hotline 864-667-3693 (After Hours)  Emergency Shelter [facility] [phone]  Maternity Homes [facility] [place] [facility] [phone] [facility] [phone]  MRSA Hotline #:   309-749-6921    [facility] Resources  [facility]  United Way                           [facility] Dept. [address] [place]                     [address]         [address]  Blondell Reveal Phone:  [phone]                                  Phone:  [phone]                   Phone:  [phone]  [facility] Mental Health Phone:  [phone]  [facility] Child Abuse Hotline [phone] [phone] (After Hours)

## 2011-08-01 NOTE — ED Provider Notes (Signed)
Assumed pt care @ 3546.  31 year old male w/ hx of alcohol abuse presents with n/v.  Is in CDU for dehydration protocol and sxs treatment.  Clinically sober  9:13 PM Pt feels better, VSS, able to tolerates PO.  Alcohol cessation discussed.  Will d/c.    Fayrene Helper, PA-C 08/01/11 2148

## 2011-08-01 NOTE — ED Provider Notes (Signed)
History     CSN: [id number]  Arrival date & time 08/01/11  1630   First MD Initiated Contact with Patient 08/01/11 1810      Chief Complaint  Patient presents with  . Emesis    (Consider location/radiation/quality/duration/timing/severity/associated sxs/prior treatment) HPI Comments: Drinking heavily for last few days.  Today with nausea, vomiting.  No abdominal pain.  Feels dehydrated.  Has a mild headache that started about an hours ago.  No numbness or weakness.  No recent injury.  Otherwise doing well.  Patient is a 31 y.o. male presenting with vomiting. The history is provided by the patient.  Emesis  This is a new problem. The current episode started 3 to 5 hours ago. The problem occurs 2 to 4 times per day. The problem has not changed since onset.The emesis has an appearance of stomach contents. There has been no fever. Pertinent negatives include no abdominal pain, no arthralgias, no chills, no diarrhea, no fever and no headaches.    Past Medical History  Diagnosis Date  . GERD (gastroesophageal reflux disease)   . H/O hiatal hernia     pt states he had an EGD at age 31 and had a hiatal hernia  . Dysphagia   . Diverticulitis of colon with bleeding 2010    Hospitalized in 2010 for "intestinal infection requiring IV antibiotics"    Past Surgical History  Procedure Date  . Tonsillectomy   . Esophagogastroduodenoscopy     age 7  . Colonoscopy 05/15/2011    Procedure: COLONOSCOPY;  Surgeon: Hilarie Fredrickson, MD;  [facility] ENDOSCOPY;  Service: Endoscopy;  Laterality: N/A;  . Esophagogastroduodenoscopy 05/15/2011    Procedure: ESOPHAGOGASTRODUODENOSCOPY (EGD);  Surgeon: Hilarie Fredrickson, MD;  Location: [facility] ENDOSCOPY;  Service: Endoscopy;  Laterality: N/A;  . Givens capsule study 05/30/2011    Procedure: GIVENS CAPSULE STUDY;  Surgeon: Hilarie Fredrickson, MD;  Location: [facility] ENDOSCOPY;  Service: Endoscopy;  Laterality: N/A;  Givens Capsule study.    Family History  Problem Relation Age  of Onset  . Diabetes type II Mother   . Emphysema Father   . Heart attack Father   . Stroke Father   . Diabetes type II Other   . Diabetes type II Maternal Aunt     History  Substance Use Topics  . Smoking status: Never Smoker   . Smokeless tobacco: Current User    Types: Chew   Comment: States he has been chewing tobacco almost daily since age 34  . Alcohol Use: Yes     average six 12oz beers/day, ranges from 3-12 12oz/day      Review of Systems  Constitutional: Negative for fever, chills, activity change and fatigue.  HENT: Negative for congestion.   Eyes: Negative for pain.  Respiratory: Negative for chest tightness, shortness of breath, wheezing and stridor.   Cardiovascular: Negative for chest pain and leg swelling.  Gastrointestinal: Positive for vomiting. Negative for abdominal pain and diarrhea.  Genitourinary: Negative for dysuria.  Musculoskeletal: Negative for arthralgias.  Skin: Negative for rash.  Neurological: Negative for headaches.  Psychiatric/Behavioral: Negative for behavioral problems.    Allergies  Review of patient's allergies indicates no known allergies.  Home Medications   Current Outpatient Rx  Name Route Sig Dispense Refill  . RANITIDINE HCL 150 MG PO TABS Oral Take 150 mg by mouth daily as needed. For heartburn    . PROMETHAZINE HCL 25 MG PO TABS Oral Take 1 tablet (25 mg total) by mouth every 6 (  six) hours as needed for nausea. 12 tablet 0    BP 123/80  Pulse 124  Temp(Src) 97.8 F (36.6 C) (Oral)  Resp 8  SpO2 96%  Physical Exam  Constitutional: He is oriented to person, place, and time. He appears well-developed and well-nourished. No distress.  HENT:  Head: Normocephalic and atraumatic.  Eyes: Conjunctivae and EOM are normal. Pupils are equal, round, and reactive to light. No scleral icterus.  Neck: Normal range of motion. Neck supple.  Cardiovascular: Regular rhythm.  Exam reveals no gallop and no friction rub.   No murmur  heard.      tachycardia  Pulmonary/Chest: Effort normal and breath sounds normal. No respiratory distress. He has no wheezes. He has no rales. He exhibits no tenderness.  Abdominal: Soft. He exhibits no distension and no mass. There is no tenderness. There is no rebound and no guarding.  Musculoskeletal: Normal range of motion. He exhibits no edema and no tenderness.  Neurological: He is alert and oriented to person, place, and time. He has normal reflexes. No cranial nerve deficit. He exhibits normal muscle tone. Coordination normal.  Skin: Skin is warm and dry. No rash noted. He is not diaphoretic. No erythema.  Psychiatric: He has a normal mood and affect. His behavior is normal. Judgment and thought content normal.    ED Course  Procedures (including critical care time)  Labs Reviewed  URINALYSIS, ROUTINE W REFLEX MICROSCOPIC - Abnormal; Notable for the following:    Hgb urine dipstick SMALL (*)    Ketones, ur >80 (*)    All other components within normal limits  CBC - Abnormal; Notable for the following:    WBC 13.6 (*)    All other components within normal limits  DIFFERENTIAL - Abnormal; Notable for the following:    Neutrophils Relative 94 (*)    Neutro Abs 12.7 (*)    Lymphocytes Relative 4 (*)    Lymphs Abs 0.5 (*)    Monocytes Relative 2 (*)    All other components within normal limits  COMPREHENSIVE METABOLIC PANEL - Abnormal; Notable for the following:    Chloride 95 (*)    CO2 17 (*)    AST 47 (*)    All other components within normal limits  URINE MICROSCOPIC-ADD ON  LIPASE, BLOOD   No results found.   1. Alcoholic ketoacidosis   2. Vomiting       MDM  Drinking heavily for last few days.  Today with nausea, vomiting.  No abdominal pain.  Feels dehydrated.  Has a mild headache that started about an hours ago.  No numbness or weakness.  No recent injury.  Otherwise doing well.  Labs suggestive of alcoholic ketoacidosis.  Hydrating and treating symptoms.   Will place in CDU with plan for hydration and treatment of symptoms.        Army Chaco, MD 08/01/11 [phone]

## 2011-08-01 NOTE — ED Notes (Signed)
Pt c/o a headache nausea vomiting for 2 days.  He feels weak

## 2011-08-08 NOTE — ED Provider Notes (Signed)
Medical screening examination/treatment/procedure(s) were performed by non-physician practitioner and as supervising physician I was immediately available for consultation/collaboration.  Emmalia Heyboer, MD 08/08/11 0844 

## 2012-01-28 ENCOUNTER — Emergency Department (HOSPITAL_COMMUNITY)
Admission: EM | Admit: 2012-01-28 | Discharge: 2012-01-28 | Disposition: A | Discharge status: Home / Self Care | Payer: Self-pay | Attending: Emergency Medicine | Admitting: Emergency Medicine

## 2012-01-28 ENCOUNTER — Encounter (HOSPITAL_COMMUNITY): Payer: Self-pay | Admitting: *Deleted

## 2012-01-28 DIAGNOSIS — R630 Anorexia: Secondary | ICD-10-CM | POA: Insufficient documentation

## 2012-01-28 DIAGNOSIS — K219 Gastro-esophageal reflux disease without esophagitis: Secondary | ICD-10-CM | POA: Insufficient documentation

## 2012-01-28 DIAGNOSIS — Z8719 Personal history of other diseases of the digestive system: Secondary | ICD-10-CM | POA: Insufficient documentation

## 2012-01-28 DIAGNOSIS — R112 Nausea with vomiting, unspecified: Secondary | ICD-10-CM | POA: Insufficient documentation

## 2012-01-28 LAB — CBC WITH DIFFERENTIAL/PLATELET
Basophils Absolute: 0 10*3/uL (ref 0.0–0.1)
HCT: 39.8 % (ref 39.0–52.0)
Hemoglobin: 13.8 g/dL (ref 13.0–17.0)
Lymphocytes Relative: 17 % (ref 12–46)
Monocytes Absolute: 0.3 10*3/uL (ref 0.1–1.0)
Neutro Abs: 6.3 10*3/uL (ref 1.7–7.7)
Neutrophils Relative %: 79 % — ABNORMAL HIGH (ref 43–77)
RDW: 13.1 % (ref 11.5–15.5)
WBC: 7.9 10*3/uL (ref 4.0–10.5)

## 2012-01-28 LAB — COMPREHENSIVE METABOLIC PANEL
Alkaline Phosphatase: 76 U/L (ref 39–117)
BUN: 12 mg/dL (ref 6–23)
Calcium: 9.2 mg/dL (ref 8.4–10.5)
GFR calc Af Amer: >90 mL/min (ref >90)
GFR calc non Af Amer: >90 mL/min (ref >90)
Glucose, Bld: 67 mg/dL — ABNORMAL LOW (ref 70–99)
Total Protein: 7 g/dL (ref 6.0–8.3)

## 2012-01-28 MED ORDER — ONDANSETRON 4 MG PO TBDP
8.0000 mg | ORAL_TABLET | Freq: Once | ORAL | Status: AC
  Administered 2012-01-28: 8 mg via ORAL

## 2012-01-28 MED ORDER — SODIUM CHLORIDE 0.9 % IV BOLUS (SEPSIS)
1000.0000 mL | Freq: Once | INTRAVENOUS | Status: AC
  Administered 2012-01-28: 1000 mL via INTRAVENOUS

## 2012-01-28 NOTE — ED Notes (Addendum)
Stated was very shaker, calmly, sweaty  And " I think I am dehydrated". Very Nauseated also, denies a vomitting .girl friend stated pt has being  takes several Zofran at home.

## 2012-01-28 NOTE — ED Notes (Addendum)
C/o "not feeling well, feeling dehydrated", reports nausea & diarrhea, onset ~ Wednesday, mild fever at onset (resolved), (denies vomiting). Last BM yesterday  (diarrhea,loose/not watery, no blood). Took 2-4mg   zofran at home around 1700 (no relief).

## 2012-01-28 NOTE — ED Provider Notes (Cosign Needed)
History     CSN: [id number]  Arrival date & time 01/28/12  0100   First MD Initiated Contact with Patient 01/28/12 0131      Chief Complaint  Patient presents with  . Nausea  . Diarrhea   HPI  History provided by the patient. Patient is a 31 year old male with history of GERD, hiatal hernia and diverticulitis who presents with complaints of nausea vomiting and diarrhea. Symptoms began 3 days ago. Patient does report having some improvement of diarrhea symptoms without many episodes today. Diarrhea was described as soft and loose without blood or mucus. There was no associated abdominal pain the patient occasionally had some cramping and discomfort. Patient has been able to keep down some fluids but has decreased appetite. Patient also feels that he may be dehydrated and dry. He has normal urination. Denies any fever, chills or sweats. He denies any known sick contacts.     Past Medical History  Diagnosis Date  . GERD (gastroesophageal reflux disease)   . H/O hiatal hernia     pt states he had an EGD at age 35 and had a hiatal hernia  . Dysphagia   . Diverticulitis of colon with bleeding 2010    Hospitalized in 2010 for "intestinal infection requiring IV antibiotics"    Past Surgical History  Procedure Date  . Tonsillectomy   . Esophagogastroduodenoscopy     age 21  . Colonoscopy 05/15/2011    Procedure: COLONOSCOPY;  Surgeon: Hilarie Fredrickson, MD;  Location: [facility] ENDOSCOPY;  Service: Endoscopy;  Laterality: N/A;  . Esophagogastroduodenoscopy 05/15/2011    Procedure: ESOPHAGOGASTRODUODENOSCOPY (EGD);  Surgeon: Hilarie Fredrickson, MD;  Location: [facility] ENDOSCOPY;  Service: Endoscopy;  Laterality: N/A;  . Givens capsule study 05/30/2011    Procedure: GIVENS CAPSULE STUDY;  Surgeon: Hilarie Fredrickson, MD;  Location: [facility] ENDOSCOPY;  Service: Endoscopy;  Laterality: N/A;  Givens Capsule study.    Family History  Problem Relation Age of Onset  . Diabetes type II Mother   . Emphysema Father   . Heart  attack Father   . Stroke Father   . Diabetes type II Other   . Diabetes type II Maternal Aunt     History  Substance Use Topics  . Smoking status: Never Smoker   . Smokeless tobacco: Current User    Types: Chew     Comment: States he has been chewing tobacco almost daily since age 68  . Alcohol Use: 1.8 oz/week    3 Cans of beer per week     Comment: average six 12oz beers/day, ranges from 3-12 12oz/day      Review of Systems  Constitutional: Positive for appetite change. Negative for fever, chills and diaphoresis.  HENT: Negative for congestion and rhinorrhea.   Respiratory: Negative for cough.   Gastrointestinal: Positive for nausea, vomiting and diarrhea. Negative for abdominal pain, blood in stool and rectal pain.  Genitourinary: Negative for dysuria, frequency, hematuria and flank pain.  Skin: Negative for rash.  All other systems reviewed and are negative.    Allergies  Review of patient's allergies indicates no known allergies.  Home Medications   Current Outpatient Rx  Name  Route  Sig  Dispense  Refill  . ONDANSETRON HCL 4 MG PO TABS   Oral   Take 4 mg by mouth every 8 (eight) hours as needed. For nausea and vomiting         . RANITIDINE HCL 150 MG PO TABS   Oral   Take  150 mg by mouth daily as needed. For heartburn           BP 123/72  Pulse 90  Temp 97.8 F (36.6 C) (Oral)  Resp 18  SpO2 97%  Physical Exam  Nursing note and vitals reviewed. Constitutional: He is oriented to person, place, and time. He appears well-developed and well-nourished. No distress.  HENT:  Head: Normocephalic.  Cardiovascular: Normal rate and regular rhythm.   No murmur heard. Pulmonary/Chest: Effort normal and breath sounds normal.  Abdominal: Soft. There is no tenderness. There is no rebound and no guarding.       No CVA tenderness  Neurological: He is alert and oriented to person, place, and time.  Skin: Skin is warm. No rash noted.  Psychiatric: He has a  normal mood and affect. His behavior is normal.    ED Course  Procedures   Results for orders placed during the hospital encounter of 01/28/12  COMPREHENSIVE METABOLIC PANEL      Component Value Range   Sodium 138  135 - 145 mEq/L   Potassium 3.9  3.5 - 5.1 mEq/L   Chloride 94 (*) 96 - 112 mEq/L   CO2 21  19 - 32 mEq/L   Glucose, Bld 67 (*) 70 - 99 mg/dL   BUN 12  6 - 23 mg/dL   Creatinine, Ser 1.61  0.50 - 1.35 mg/dL   Calcium 9.2  8.4 - 09.6 mg/dL   Total Protein 7.0  6.0 - 8.3 g/dL   Albumin 4.6  3.5 - 5.2 g/dL   AST 26  0 - 37 U/L   ALT 11  0 - 53 U/L   Alkaline Phosphatase 76  39 - 117 U/L   Total Bilirubin 0.6  0.3 - 1.2 mg/dL   GFR calc non Af Amer >90  >90 mL/min   GFR calc Af Amer >90  >90 mL/min  CBC WITH DIFFERENTIAL      Component Value Range   WBC 7.9  4.0 - 10.5 K/uL   RBC 4.53  4.22 - 5.81 MIL/uL   Hemoglobin 13.8  13.0 - 17.0 g/dL   HCT 04.5  40.9 - 81.1 %   MCV 87.9  78.0 - 100.0 fL   MCH 30.5  26.0 - 34.0 pg   MCHC 34.7  30.0 - 36.0 g/dL   RDW 91.4  78.2 - 95.6 %   Platelets 215  150 - 400 K/uL   Neutrophils Relative 79 (*) 43 - 77 %   Neutro Abs 6.3  1.7 - 7.7 K/uL   Lymphocytes Relative 17  12 - 46 %   Lymphs Abs 1.3  0.7 - 4.0 K/uL   Monocytes Relative 4  3 - 12 %   Monocytes Absolute 0.3  0.1 - 1.0 K/uL   Eosinophils Relative 0  0 - 5 %   Eosinophils Absolute 0.0  0.0 - 0.7 K/uL   Basophils Relative 0  0 - 1 %   Basophils Absolute 0.0  0.0 - 0.1 K/uL       1. Nausea vomiting and diarrhea       MDM  1:30AM patient seen and evaluated. Patient resting in bed and does not appear in any acute distress or discomfort.   Patient given IV fluids and Zofran for nausea. He appears well and labs are unremarkable. At this time suspect viral syndrome and will discharge home.    Angus Seller, PA 01/28/12 2356

## 2012-02-26 ENCOUNTER — Emergency Department (HOSPITAL_COMMUNITY)
Admission: EM | Admit: 2012-02-26 | Discharge: 2012-02-26 | Disposition: A | Discharge status: Home / Self Care | Payer: Self-pay | Attending: Emergency Medicine | Admitting: Emergency Medicine

## 2012-02-26 ENCOUNTER — Encounter (HOSPITAL_COMMUNITY): Payer: Self-pay | Admitting: *Deleted

## 2012-02-26 DIAGNOSIS — F411 Generalized anxiety disorder: Secondary | ICD-10-CM | POA: Insufficient documentation

## 2012-02-26 DIAGNOSIS — K219 Gastro-esophageal reflux disease without esophagitis: Secondary | ICD-10-CM | POA: Insufficient documentation

## 2012-02-26 DIAGNOSIS — E860 Dehydration: Secondary | ICD-10-CM | POA: Insufficient documentation

## 2012-02-26 DIAGNOSIS — F3289 Other specified depressive episodes: Secondary | ICD-10-CM | POA: Insufficient documentation

## 2012-02-26 DIAGNOSIS — R5383 Other fatigue: Secondary | ICD-10-CM | POA: Insufficient documentation

## 2012-02-26 DIAGNOSIS — R197 Diarrhea, unspecified: Secondary | ICD-10-CM | POA: Insufficient documentation

## 2012-02-26 DIAGNOSIS — R1110 Vomiting, unspecified: Secondary | ICD-10-CM

## 2012-02-26 DIAGNOSIS — F329 Major depressive disorder, single episode, unspecified: Secondary | ICD-10-CM | POA: Insufficient documentation

## 2012-02-26 DIAGNOSIS — Z8719 Personal history of other diseases of the digestive system: Secondary | ICD-10-CM | POA: Insufficient documentation

## 2012-02-26 DIAGNOSIS — R5381 Other malaise: Secondary | ICD-10-CM | POA: Insufficient documentation

## 2012-02-26 DIAGNOSIS — F1010 Alcohol abuse, uncomplicated: Secondary | ICD-10-CM | POA: Insufficient documentation

## 2012-02-26 DIAGNOSIS — Z79899 Other long term (current) drug therapy: Secondary | ICD-10-CM | POA: Insufficient documentation

## 2012-02-26 LAB — URINE MICROSCOPIC-ADD ON

## 2012-02-26 LAB — GLUCOSE, CAPILLARY
Glucose-Capillary: 49 mg/dL — ABNORMAL LOW (ref 70–99)
Glucose-Capillary: 117 mg/dL — ABNORMAL HIGH (ref 70–99)

## 2012-02-26 LAB — BASIC METABOLIC PANEL
Chloride: 100 mEq/L (ref 96–112)
Creatinine, Ser: 0.71 mg/dL (ref 0.50–1.35)
GFR calc Af Amer: >90 mL/min (ref >90)
Sodium: 136 mEq/L (ref 135–145)

## 2012-02-26 LAB — URINALYSIS, ROUTINE W REFLEX MICROSCOPIC
Bilirubin Urine: NEGATIVE
Ketones, ur: >80 mg/dL — AB
Nitrite: NEGATIVE
Specific Gravity, Urine: 1.021 (ref 1.005–1.030)
Urobilinogen, UA: 0.2 mg/dL (ref 0.0–1.0)

## 2012-02-26 LAB — COMPREHENSIVE METABOLIC PANEL
ALT: 20 U/L (ref 0–53)
Alkaline Phosphatase: 70 U/L (ref 39–117)
BUN: 14 mg/dL (ref 6–23)
CO2: 15 mEq/L — ABNORMAL LOW (ref 19–32)
Chloride: 95 mEq/L — ABNORMAL LOW (ref 96–112)
GFR calc Af Amer: >90 mL/min (ref >90)
Glucose, Bld: 47 mg/dL — ABNORMAL LOW (ref 70–99)
Potassium: 3.9 mEq/L (ref 3.5–5.1)
Sodium: 143 mEq/L (ref 135–145)
Total Bilirubin: 0.5 mg/dL (ref 0.3–1.2)

## 2012-02-26 LAB — CBC WITH DIFFERENTIAL/PLATELET
Eosinophils Absolute: 0 10*3/uL (ref 0.0–0.7)
Hemoglobin: 15 g/dL (ref 13.0–17.0)
Lymphocytes Relative: 7 % — ABNORMAL LOW (ref 12–46)
Lymphs Abs: 1.1 10*3/uL (ref 0.7–4.0)
MCH: 31.5 pg (ref 26.0–34.0)
Monocytes Relative: 3 % (ref 3–12)
Neutro Abs: 13.8 10*3/uL — ABNORMAL HIGH (ref 1.7–7.7)
Neutrophils Relative %: 90 % — ABNORMAL HIGH (ref 43–77)
Platelets: 177 10*3/uL (ref 150–400)
RBC: 4.76 MIL/uL (ref 4.22–5.81)
WBC: 15.4 10*3/uL — ABNORMAL HIGH (ref 4.0–10.5)

## 2012-02-26 MED ORDER — ONDANSETRON HCL 4 MG/2ML IJ SOLN
4.0000 mg | Freq: Once | INTRAMUSCULAR | Status: AC
  Administered 2012-02-26: 4 mg via INTRAVENOUS
  Filled 2012-02-26: qty 2

## 2012-02-26 MED ORDER — DEXTROSE 50 % IV SOLN
INTRAVENOUS | Status: AC
  Administered 2012-02-26: 25 mL
  Filled 2012-02-26: qty 50

## 2012-02-26 MED ORDER — DEXTROSE 50 % IV SOLN: 1.0000 | Freq: Once | INTRAVENOUS | Status: DC

## 2012-02-26 MED ORDER — SODIUM CHLORIDE 0.9 % IV BOLUS (SEPSIS)
1000.0000 mL | Freq: Once | INTRAVENOUS | Status: AC
  Administered 2012-02-26: 1000 mL via INTRAVENOUS

## 2012-02-26 MED ORDER — ONDANSETRON HCL 4 MG PO TABS: 4.0000 mg | ORAL_TABLET | Freq: Four times a day (QID) | ORAL | Status: DC
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
## 2012-02-26 NOTE — ED Notes (Signed)
Pt states that since this am has been feeling weak and vomiting.  Reports this happens when he drinks at times.  Blood sugar 49 and pt is not diabetic.  Pt is pale and continues to feel nauseated.  Informed Dr. Radford Pax and got order for IV glucose

## 2012-02-26 NOTE — ED Notes (Signed)
Pt updated on status of ACT team consult. Pt stated that he doesn't have insurance and has been on anti-depressants in the past, but cannot maintain the prescription because he doesn't have the insurance coverage to see a primary physician.

## 2012-02-26 NOTE — ED Notes (Addendum)
Pt cbg reported to be 47 by EMT

## 2012-02-26 NOTE — ED Notes (Signed)
Fayrene Fearing RN spoke with Tammy Sours from ACT team. Tammy Sours printed outpatient referral resources for pt.

## 2012-02-26 NOTE — ED Notes (Signed)
Pt requested update about ACT consult, secretary notified and ACT team re paged.

## 2012-02-26 NOTE — ED Provider Notes (Signed)
History     CSN: [id number]  Arrival date & time 02/26/12  1425   First MD Initiated Contact with Patient 02/26/12 1500      Chief Complaint  Patient presents with  . Emesis  . Weakness    (Consider location/radiation/quality/duration/timing/severity/associated sxs/prior treatment) HPI Pt admits to heavy alcohol consumption last night and then diarrhea and vomiting today. +generalized weakness. No abd pain. Prev episodes of alcoholic ketoacidosis similar to presentation. No fever or chills.  Past Medical History  Diagnosis Date  . GERD (gastroesophageal reflux disease)   . H/O hiatal hernia     pt states he had an EGD at age 1 and had a hiatal hernia  . Dysphagia   . Diverticulitis of colon with bleeding 2010    Hospitalized in 2010 for "intestinal infection requiring IV antibiotics"    Past Surgical History  Procedure Date  . Tonsillectomy   . Esophagogastroduodenoscopy     age 79  . Colonoscopy 05/15/2011    Procedure: COLONOSCOPY;  Surgeon: Hilarie Fredrickson, MD;  Location[facility] ENDOSCOPY;  Service: Endoscopy;  Laterality: N/A;  . Esophagogastroduodenoscopy 05/15/2011    Procedure: ESOPHAGOGASTRODUODENOSCOPY (EGD);  Surgeon: Hilarie Fredrickson, MD;  Location: [facility] ENDOSCOPY;  Service: Endoscopy;  Laterality: N/A;  . Givens capsule study 05/30/2011    Procedure: GIVENS CAPSULE STUDY;  Surgeon: Hilarie Fredrickson, MD;  Location: [facility] ENDOSCOPY;  Service: Endoscopy;  Laterality: N/A;  Givens Capsule study.    Family History  Problem Relation Age of Onset  . Diabetes type II Mother   . Emphysema Father   . Heart attack Father   . Stroke Father   . Diabetes type II Other   . Diabetes type II Maternal Aunt     History  Substance Use Topics  . Smoking status: Never Smoker   . Smokeless tobacco: Current User    Types: Chew     Comment: States he has been chewing tobacco almost daily since age 50  . Alcohol Use: 1.8 oz/week    3 Cans of beer per week     Comment: average six 12oz  beers/day, ranges from 3-12 12oz/day      Review of Systems  Constitutional: Positive for fatigue. Negative for fever and chills.  HENT: Negative for neck pain and neck stiffness.   Respiratory: Negative for shortness of breath and wheezing.   Cardiovascular: Negative for chest pain and palpitations.  Gastrointestinal: Positive for nausea, vomiting and diarrhea. Negative for abdominal pain.  Musculoskeletal: Negative for myalgias and back pain.  Skin: Negative for rash and wound.  Neurological: Positive for weakness. Negative for dizziness, syncope, light-headedness, numbness and headaches.    Allergies  Review of patient's allergies indicates no known allergies.  Home Medications   Current Outpatient Rx  Name  Route  Sig  Dispense  Refill  . CITALOPRAM HYDROBROMIDE 20 MG PO TABS   Oral   Take 20 mg by mouth daily.         . IBUPROFEN 200 MG PO TABS   Oral   Take 400 mg by mouth every 6 (six) hours as needed. For pain.         Marland Kitchen RANITIDINE HCL 150 MG PO TABS   Oral   Take 150 mg by mouth daily as needed. For heartburn         . TRAZODONE HCL 100 MG PO TABS   Oral   Take 100 mg by mouth at bedtime.         Marland Kitchen  ONDANSETRON HCL 4 MG PO TABS   Oral   Take 4 mg by mouth every 8 (eight) hours as needed. For nausea and vomiting         . ONDANSETRON HCL 4 MG PO TABS   Oral   Take 1 tablet (4 mg total) by mouth every 6 (six) hours.   12 tablet   0     BP 125/83  Pulse 105  Temp 97.9 F (36.6 C) (Oral)  Resp 14  SpO2 99%  Physical Exam  Nursing note and vitals reviewed. Constitutional: He is oriented to person, place, and time. He appears well-developed and well-nourished. No distress.  HENT:  Head: Normocephalic and atraumatic.  Mouth/Throat: Oropharynx is clear and moist.  Eyes: EOM are normal. Pupils are equal, round, and reactive to light.  Neck: Normal range of motion. Neck supple.  Cardiovascular: Regular rhythm.        tachycardia   Pulmonary/Chest: Effort normal and breath sounds normal. No respiratory distress. He has no wheezes. He has no rales. He exhibits no tenderness.  Abdominal: Soft. Bowel sounds are normal. He exhibits no distension and no mass. There is no tenderness. There is no rebound and no guarding.  Musculoskeletal: Normal range of motion. He exhibits no edema and no tenderness.  Neurological: He is alert and oriented to person, place, and time.       5/5 motor in all ext, sensation intact  Skin: Skin is warm and dry. No rash noted. No erythema.  Psychiatric: He has a normal mood and affect. His behavior is normal.    ED Course  Procedures (including critical care time)  Labs Reviewed  ETHANOL - Abnormal; Notable for the following:    Alcohol, Ethyl (B) 137 (*)     All other components within normal limits  CBC WITH DIFFERENTIAL - Abnormal; Notable for the following:    WBC 15.4 (*)     Neutrophils Relative 90 (*)     Neutro Abs 13.8 (*)     Lymphocytes Relative 7 (*)     All other components within normal limits  COMPREHENSIVE METABOLIC PANEL - Abnormal; Notable for the following:    Chloride 95 (*)     CO2 15 (*)     Glucose, Bld 47 (*)     AST 49 (*)     All other components within normal limits  GLUCOSE, CAPILLARY - Abnormal; Notable for the following:    Glucose-Capillary 49 (*)     All other components within normal limits  URINALYSIS, ROUTINE W REFLEX MICROSCOPIC - Abnormal; Notable for the following:    APPearance CLOUDY (*)     Glucose, UA 250 (*)     Hgb urine dipstick TRACE (*)     Ketones, ur >80 (*)     All other components within normal limits  GLUCOSE, CAPILLARY - Abnormal; Notable for the following:    Glucose-Capillary 117 (*)     All other components within normal limits  BASIC METABOLIC PANEL - Abnormal; Notable for the following:    CO2 17 (*)     Calcium 7.2 (*)     All other components within normal limits  LIPASE, BLOOD  URINE MICROSCOPIC-ADD ON   No  results found.   1. Alcohol abuse   2. Dehydration   3. Vomiting and diarrhea       Date: 02/26/2012  Rate: 109  Rhythm: sinus tachycardia  QRS Axis: normal  Intervals: normal  ST/T Wave abnormalities: normal  Conduction Disutrbances:none  Narrative Interpretation:   Old EKG Reviewed: none available   MDM  Pt feeling better. HR improved. Tolerating PO's. ACT gave resources for outpt treatment of alcoholism.         Loren Racer, MD 02/26/12 [phone]

## 2012-03-14 NOTE — ED Provider Notes (Signed)
Medical screening examination/treatment/procedure(s) were performed by non-physician practitioner and as supervising physician I was immediately available for consultation/collaboration.  Brandt Loosen, MD 03/14/12 1141

## 2012-05-17 ENCOUNTER — Emergency Department (HOSPITAL_COMMUNITY)
Admission: EM | Admit: 2012-05-17 | Discharge: 2012-05-18 | Disposition: A | Discharge status: Home / Self Care | Payer: Self-pay | Attending: Dermatology | Admitting: Dermatology

## 2012-05-17 ENCOUNTER — Encounter (HOSPITAL_COMMUNITY): Payer: Self-pay | Admitting: *Deleted

## 2012-05-17 DIAGNOSIS — R5381 Other malaise: Secondary | ICD-10-CM | POA: Insufficient documentation

## 2012-05-17 DIAGNOSIS — F3289 Other specified depressive episodes: Secondary | ICD-10-CM | POA: Insufficient documentation

## 2012-05-17 DIAGNOSIS — R000 Tachycardia, unspecified: Secondary | ICD-10-CM | POA: Insufficient documentation

## 2012-05-17 DIAGNOSIS — E162 Hypoglycemia, unspecified: Secondary | ICD-10-CM | POA: Insufficient documentation

## 2012-05-17 DIAGNOSIS — G479 Sleep disorder, unspecified: Secondary | ICD-10-CM | POA: Insufficient documentation

## 2012-05-17 DIAGNOSIS — F1010 Alcohol abuse, uncomplicated: Secondary | ICD-10-CM | POA: Insufficient documentation

## 2012-05-17 DIAGNOSIS — F329 Major depressive disorder, single episode, unspecified: Secondary | ICD-10-CM | POA: Insufficient documentation

## 2012-05-17 DIAGNOSIS — F411 Generalized anxiety disorder: Secondary | ICD-10-CM | POA: Insufficient documentation

## 2012-05-17 DIAGNOSIS — Z79899 Other long term (current) drug therapy: Secondary | ICD-10-CM | POA: Insufficient documentation

## 2012-05-17 DIAGNOSIS — E860 Dehydration: Secondary | ICD-10-CM | POA: Insufficient documentation

## 2012-05-17 DIAGNOSIS — R51 Headache: Secondary | ICD-10-CM | POA: Insufficient documentation

## 2012-05-17 DIAGNOSIS — Z8719 Personal history of other diseases of the digestive system: Secondary | ICD-10-CM | POA: Insufficient documentation

## 2012-05-17 DIAGNOSIS — Z8659 Personal history of other mental and behavioral disorders: Secondary | ICD-10-CM | POA: Insufficient documentation

## 2012-05-17 HISTORY — DX: Post-traumatic stress disorder, unspecified: F43.10

## 2012-05-17 LAB — CBC
HCT: 41.4 % (ref 39.0–52.0)
Platelets: 171 10*3/uL (ref 150–400)
RBC: 4.71 MIL/uL (ref 4.22–5.81)
RDW: 12.9 % (ref 11.5–15.5)
WBC: 5.8 10*3/uL (ref 4.0–10.5)

## 2012-05-17 LAB — GLUCOSE, CAPILLARY: Glucose-Capillary: 72 mg/dL (ref 70–99)

## 2012-05-17 LAB — POCT I-STAT, CHEM 8
Hemoglobin: 15.3 g/dL (ref 13.0–17.0)
Sodium: 140 mEq/L (ref 135–145)
TCO2: 30 mmol/L (ref 0–100)

## 2012-05-17 LAB — COMPREHENSIVE METABOLIC PANEL
ALT: 37 U/L (ref 0–53)
AST: 66 U/L — ABNORMAL HIGH (ref 0–37)
Albumin: 4.5 g/dL (ref 3.5–5.2)
Alkaline Phosphatase: 66 U/L (ref 39–117)
Potassium: 4.2 mEq/L (ref 3.5–5.1)
Sodium: 139 mEq/L (ref 135–145)
Total Protein: 7.2 g/dL (ref 6.0–8.3)

## 2012-05-17 MED ORDER — SODIUM CHLORIDE 0.9 % IV BOLUS (SEPSIS)
1000.0000 mL | Freq: Once | INTRAVENOUS | Status: AC
  Administered 2012-05-18: 1000 mL via INTRAVENOUS

## 2012-05-17 MED ORDER — SODIUM CHLORIDE 0.9 % IV BOLUS (SEPSIS)
1000.0000 mL | Freq: Once | INTRAVENOUS | Status: AC
  Administered 2012-05-17: 1000 mL via INTRAVENOUS

## 2012-05-17 MED ORDER — LORAZEPAM 1 MG PO TABS
1.0000 mg | ORAL_TABLET | Freq: Once | ORAL | Status: AC
  Administered 2012-05-17: 1 mg via ORAL
  Filled 2012-05-17: qty 1

## 2012-05-17 NOTE — ED Provider Notes (Signed)
History     CSN: [id number]  Arrival date & time 05/17/12  2116   First MD Initiated Contact with Patient 05/17/12 2247      Chief Complaint  Patient presents with  . Dehydration  . Alcohol Problem  . Hypoglycemia  . Tachycardia    (Consider location/radiation/quality/duration/timing/severity/associated sxs/prior treatment) HPI Samuel [name] is a 32 y.o. male who presents to ED with several complains. Pt states he has had problems with his blood sugars at home. Pt is not a diabetic, however, states his blood sugar is fluctuating at home. States was 520 yesterday, and today, dropped to 60s. Pt did have A1C checked recently and was 5.4. Pt states he is binging alcohol drinking, not eating, and suspects that is what dropping his blood sugar. Pt states that he is having worsening depression. Was hospitalized for ketosis recently, started on wellbutrin, states stopped it few days ago, which made him feel worse. States since then more depressed. States using alcohol to self medicate. States that he does not have a psychiatrist or primary care doctor. Denies nausea, vomiting, abdominal pain.    Past Medical History  Diagnosis Date  . GERD (gastroesophageal reflux disease)   . H/O hiatal hernia     pt states he had an EGD at age 24 and had a hiatal hernia  . Dysphagia   . Diverticulitis of colon with bleeding 2010    Hospitalized in 2010 for "intestinal infection requiring IV antibiotics"  . PTSD (post-traumatic stress disorder)     Past Surgical History  Procedure Laterality Date  . Tonsillectomy    . Esophagogastroduodenoscopy      age 32  . Colonoscopy  05/15/2011    Procedure: COLONOSCOPY;  Surgeon: Hilarie Fredrickson, MD;  Location: [facility] ENDOSCOPY;  Service: Endoscopy;  Laterality: N/A;  . Esophagogastroduodenoscopy  05/15/2011    Procedure: ESOPHAGOGASTRODUODENOSCOPY (EGD);  Surgeon: Hilarie Fredrickson, MD;  Location: [facility] ENDOSCOPY;  Service: Endoscopy;  Laterality: N/A;  . Givens capsule  study  05/30/2011    Procedure: GIVENS CAPSULE STUDY;  Surgeon: Hilarie Fredrickson, MD;  Location: [facility] ENDOSCOPY;  Service: Endoscopy;  Laterality: N/A;  Givens Capsule study.    Family History  Problem Relation Age of Onset  . Diabetes type II Mother   . Emphysema Father   . Heart attack Father   . Stroke Father   . Diabetes type II Other   . Diabetes type II Maternal Aunt     History  Substance Use Topics  . Smoking status: Never Smoker   . Smokeless tobacco: Current User    Types: Chew     Comment: States he has been chewing tobacco almost daily since age 89  . Alcohol Use: 1.8 oz/week    3 Cans of beer per week     Comment: average six 12oz beers/day, ranges from 3-12 12oz/day      Review of Systems  Constitutional: Negative for fever and chills.  HENT: Negative for neck pain and neck stiffness.   Respiratory: Negative.   Cardiovascular: Negative.   Gastrointestinal: Negative.   Genitourinary: Negative.   Neurological: Positive for weakness and headaches.  Psychiatric/Behavioral: Positive for sleep disturbance. Negative for suicidal ideas. The patient is nervous/anxious.        Depression, alcohol abuse,     Allergies  Review of patient's allergies indicates no known allergies.  Home Medications   Current Outpatient Rx  Name  Route  Sig  Dispense  Refill  . buPROPion [facility] SR) 150  MG 12 hr tablet   Oral   Take 150 mg by mouth 2 (two) times daily.         . ciprofloxacin (CIPRO) 250 MG tablet   Oral   Take 250 mg by mouth 2 (two) times daily.           BP 127/83  Pulse 130  Resp 18  SpO2 95%  Physical Exam  Nursing note and vitals reviewed. Constitutional: He is oriented to person, place, and time. He appears well-developed and well-nourished. No distress.  HENT:  Head: Normocephalic.  Eyes: Conjunctivae are normal.  Neck: Neck supple.  Cardiovascular: Regular rhythm and normal heart sounds.  Tachycardia present.   Pulmonary/Chest: Effort  normal and breath sounds normal. No respiratory distress. He has no wheezes. He has no rales.  Abdominal: Soft. Bowel sounds are normal. He exhibits no distension. There is no tenderness. There is no rebound.  Musculoskeletal: He exhibits no edema.  Neurological: He is alert and oriented to person, place, and time.  Skin: Skin is warm and dry.  Psychiatric: He has a normal mood and affect.    ED Course  Procedures (including critical care time)  Pt with alcohol abuse, PTST, anxiety, took himself off of wellbutrin few days ago, binging since then. Wants different meds, will get psych consult. Will get labs, fluids through IV.   Results for orders placed during the hospital encounter of 05/17/12  CBC      Result Value Range   WBC 5.8  4.0 - 10.5 K/uL   RBC 4.71  4.22 - 5.81 MIL/uL   Hemoglobin 15.0  13.0 - 17.0 g/dL   HCT 16.1  09.6 - 04.5 %   MCV 87.9  78.0 - 100.0 fL   MCH 31.8  26.0 - 34.0 pg   MCHC 36.2 (*) 30.0 - 36.0 g/dL   RDW 40.9  81.1 - 91.4 %   Platelets 171  150 - 400 K/uL  URINALYSIS, ROUTINE W REFLEX MICROSCOPIC      Result Value Range   Color, Urine YELLOW  YELLOW   APPearance HAZY (*) CLEAR   Specific Gravity, Urine 1.014  1.005 - 1.030   pH 5.0  5.0 - 8.0   Glucose, UA NEGATIVE  NEGATIVE mg/dL   Hgb urine dipstick SMALL (*) NEGATIVE   Bilirubin Urine NEGATIVE  NEGATIVE   Ketones, ur >80 (*) NEGATIVE mg/dL   Protein, ur NEGATIVE  NEGATIVE mg/dL   Urobilinogen, UA 0.2  0.0 - 1.0 mg/dL   Nitrite NEGATIVE  NEGATIVE   Leukocytes, UA NEGATIVE  NEGATIVE  GLUCOSE, CAPILLARY      Result Value Range   Glucose-Capillary 72  70 - 99 mg/dL  COMPREHENSIVE METABOLIC PANEL      Result Value Range   Sodium 139  135 - 145 mEq/L   Potassium 4.2  3.5 - 5.1 mEq/L   Chloride 94 (*) 96 - 112 mEq/L   CO2 27  19 - 32 mEq/L   Glucose, Bld 142 (*) 70 - 99 mg/dL   BUN 6  6 - 23 mg/dL   Creatinine, Ser 7.82  0.50 - 1.35 mg/dL   Calcium 9.2  8.4 - 95.6 mg/dL   Total Protein 7.2   6.0 - 8.3 g/dL   Albumin 4.5  3.5 - 5.2 g/dL   AST 66 (*) 0 - 37 U/L   ALT 37  0 - 53 U/L   Alkaline Phosphatase 66  39 - 117 U/L   Total  Bilirubin 0.5  0.3 - 1.2 mg/dL   GFR calc non Af Amer >90  >90 mL/min   GFR calc Af Amer >90  >90 mL/min  URINE MICROSCOPIC-ADD ON      Result Value Range   WBC, UA 0-2  <3 WBC/hpf   RBC / HPF 0-2  <3 RBC/hpf  POCT I-STAT, CHEM 8      Result Value Range   Sodium 140  135 - 145 mEq/L   Potassium 4.0  3.5 - 5.1 mEq/L   Chloride 100  96 - 112 mEq/L   BUN 3 (*) 6 - 23 mg/dL   Creatinine, Ser 1.61  0.50 - 1.35 mg/dL   Glucose, Bld 71  70 - 99 mg/dL   Calcium, Ion 0.96 (*) 1.12 - 1.23 mmol/L   TCO2 30  0 - 100 mmol/L   Hemoglobin 15.3  13.0 - 17.0 g/dL   HCT 04.5  40.9 - 81.1 %   No results found.  1:59 AM PT had his telepsych, recommended detox and follow up with psychiatrist. Pt refuses telepsych   1. Depression   2. Alcohol abuse   3. Dehydration       MDM  Pt with alcohol abuse, depression, dehydration, blood sugar dropping at home. Here blood sugar normal. He is non toxic appearing. No distress. Original hr 130, but down into 90s with fluids. Pt was rehydrated. Electrolytes unremarkable. telepsych done to see if recommendations can be made for his psychiatric disorders and depression until he is able to follow up. Per telepsych, recommended alcohol detox prior to pharmocotherapy. Pt refusing inpatient detox, stating he does not need help quitting drinking. Pt denies SI or HI. He will be discharged home with resources for follow up. Pt will be back on wellbutrin. Given one refill until able to find a psychiatrist.         Lottie Mussel, PA-C 05/18/12 [phone]

## 2012-05-17 NOTE — ED Notes (Signed)
Hx. Of etoh. Drank x 3 light 12 oz beers. Binge drinking. Hx. Of ptsd. Anxious, dehydration, not eating well. Blood sugars 40's to 520 (other day). Now it is in the 70's. Never dx. With diabetes. Cp the other day.

## 2012-05-18 LAB — URINALYSIS, ROUTINE W REFLEX MICROSCOPIC
Nitrite: NEGATIVE
Specific Gravity, Urine: 1.014 (ref 1.005–1.030)
Urobilinogen, UA: 0.2 mg/dL (ref 0.0–1.0)
pH: 5 (ref 5.0–8.0)

## 2012-05-18 MED ORDER — BUPROPION HCL ER (SR) 150 MG PO TB12: 150.0000 mg | ORAL_TABLET | Freq: Two times a day (BID) | ORAL | Status: DC

## 2012-05-18 NOTE — ED Notes (Signed)
Telepsyc in progress.  

## 2012-05-18 NOTE — ED Notes (Signed)
telepsych complete, EDPA aware.

## 2012-05-18 NOTE — ED Notes (Signed)
Pt provided with resource guide and instructions on how to contact appropriate follow-up resources.

## 2012-05-18 NOTE — ED Provider Notes (Signed)
Medical screening examination/treatment/procedure(s) were performed by non-physician practitioner and as supervising physician I was immediately available for consultation/collaboration.   Suzi Roots, MD 05/18/12 [phone]

## 2012-05-28 ENCOUNTER — Observation Stay (HOSPITAL_COMMUNITY)
Admission: EM | Admit: 2012-05-28 | Discharge: 2012-05-29 | Disposition: A | Discharge status: Home / Self Care | Payer: Self-pay | Attending: Internal Medicine | Admitting: Internal Medicine

## 2012-05-28 ENCOUNTER — Encounter (HOSPITAL_COMMUNITY): Payer: Self-pay

## 2012-05-28 DIAGNOSIS — Z79899 Other long term (current) drug therapy: Secondary | ICD-10-CM | POA: Insufficient documentation

## 2012-05-28 DIAGNOSIS — F4310 Post-traumatic stress disorder, unspecified: Secondary | ICD-10-CM | POA: Insufficient documentation

## 2012-05-28 DIAGNOSIS — K922 Gastrointestinal hemorrhage, unspecified: Secondary | ICD-10-CM

## 2012-05-28 DIAGNOSIS — E162 Hypoglycemia, unspecified: Secondary | ICD-10-CM | POA: Diagnosis present

## 2012-05-28 DIAGNOSIS — R748 Abnormal levels of other serum enzymes: Secondary | ICD-10-CM | POA: Diagnosis present

## 2012-05-28 DIAGNOSIS — F1010 Alcohol abuse, uncomplicated: Secondary | ICD-10-CM | POA: Diagnosis present

## 2012-05-28 DIAGNOSIS — E860 Dehydration: Secondary | ICD-10-CM

## 2012-05-28 DIAGNOSIS — R7402 Elevation of levels of lactic acid dehydrogenase (LDH): Secondary | ICD-10-CM

## 2012-05-28 DIAGNOSIS — I498 Other specified cardiac arrhythmias: Secondary | ICD-10-CM | POA: Insufficient documentation

## 2012-05-28 DIAGNOSIS — E8729 Other acidosis: Secondary | ICD-10-CM

## 2012-05-28 DIAGNOSIS — E872 Acidosis, unspecified: Principal | ICD-10-CM

## 2012-05-28 DIAGNOSIS — D62 Acute posthemorrhagic anemia: Secondary | ICD-10-CM

## 2012-05-28 DIAGNOSIS — K219 Gastro-esophageal reflux disease without esophagitis: Secondary | ICD-10-CM | POA: Diagnosis present

## 2012-05-28 DIAGNOSIS — R933 Abnormal findings on diagnostic imaging of other parts of digestive tract: Secondary | ICD-10-CM

## 2012-05-28 DIAGNOSIS — R7401 Elevation of levels of liver transaminase levels: Secondary | ICD-10-CM | POA: Insufficient documentation

## 2012-05-28 DIAGNOSIS — K760 Fatty (change of) liver, not elsewhere classified: Secondary | ICD-10-CM

## 2012-05-28 DIAGNOSIS — D126 Benign neoplasm of colon, unspecified: Secondary | ICD-10-CM

## 2012-05-28 DIAGNOSIS — D72829 Elevated white blood cell count, unspecified: Secondary | ICD-10-CM | POA: Diagnosis present

## 2012-05-28 LAB — COMPREHENSIVE METABOLIC PANEL
ALT: 112 U/L — ABNORMAL HIGH (ref 0–53)
AST: 183 U/L — ABNORMAL HIGH (ref 0–37)
Albumin: 4.9 g/dL (ref 3.5–5.2)
Alkaline Phosphatase: 76 U/L (ref 39–117)
BUN: 12 mg/dL (ref 6–23)
CO2: 13 mEq/L — ABNORMAL LOW (ref 19–32)
Calcium: 9.5 mg/dL (ref 8.4–10.5)
Chloride: 93 mEq/L — ABNORMAL LOW (ref 96–112)
Creatinine, Ser: 0.8 mg/dL (ref 0.50–1.35)
GFR calc Af Amer: >90 mL/min (ref >90)
GFR calc non Af Amer: >90 mL/min (ref >90)
Glucose, Bld: 48 mg/dL — ABNORMAL LOW (ref 70–99)
Potassium: 4.6 mEq/L (ref 3.5–5.1)
Sodium: 143 mEq/L (ref 135–145)
Total Bilirubin: 0.8 mg/dL (ref 0.3–1.2)
Total Protein: 7.9 g/dL (ref 6.0–8.3)

## 2012-05-28 LAB — CBC WITH DIFFERENTIAL/PLATELET
Basophils Absolute: 0 10*3/uL (ref 0.0–0.1)
Basophils Relative: 0 % (ref 0–1)
Eosinophils Absolute: 0 10*3/uL (ref 0.0–0.7)
Eosinophils Relative: 0 % (ref 0–5)
HCT: 44.2 % (ref 39.0–52.0)
Hemoglobin: 14.8 g/dL (ref 13.0–17.0)
Lymphocytes Relative: 5 % — ABNORMAL LOW (ref 12–46)
Lymphs Abs: 0.9 10*3/uL (ref 0.7–4.0)
MCH: 30.8 pg (ref 26.0–34.0)
MCHC: 33.5 g/dL (ref 30.0–36.0)
MCV: 91.9 fL (ref 78.0–100.0)
Monocytes Absolute: 1.7 10*3/uL — ABNORMAL HIGH (ref 0.1–1.0)
Monocytes Relative: 9 % (ref 3–12)
Neutro Abs: 16.9 10*3/uL — ABNORMAL HIGH (ref 1.7–7.7)
Neutrophils Relative %: 87 % — ABNORMAL HIGH (ref 43–77)
Platelets: 251 10*3/uL (ref 150–400)
RBC: 4.81 MIL/uL (ref 4.22–5.81)
RDW: 14.5 % (ref 11.5–15.5)
WBC: 19.6 10*3/uL — ABNORMAL HIGH (ref 4.0–10.5)

## 2012-05-28 LAB — MAGNESIUM: Magnesium: 1.5 mg/dL (ref 1.5–2.5)

## 2012-05-28 LAB — URINALYSIS, MICROSCOPIC ONLY
Bilirubin Urine: NEGATIVE
Glucose, UA: >1000 mg/dL — AB
Ketones, ur: >80 mg/dL — AB
Leukocytes, UA: NEGATIVE
Nitrite: NEGATIVE
Protein, ur: NEGATIVE mg/dL
Specific Gravity, Urine: 1.025 (ref 1.005–1.030)
Urobilinogen, UA: 0.2 mg/dL (ref 0.0–1.0)
pH: 5 (ref 5.0–8.0)

## 2012-05-28 LAB — GLUCOSE, CAPILLARY
Glucose-Capillary: 194 mg/dL — ABNORMAL HIGH (ref 70–99)
Glucose-Capillary: 161 mg/dL — ABNORMAL HIGH (ref 70–99)

## 2012-05-28 LAB — POCT I-STAT TROPONIN I: Troponin i, poc: 0 ng/mL (ref 0.00–0.08)

## 2012-05-28 LAB — LIPASE, BLOOD: Lipase: 27 U/L (ref 11–59)

## 2012-05-28 LAB — PHOSPHORUS: Phosphorus: 2.6 mg/dL (ref 2.3–4.6)

## 2012-05-28 MED ORDER — DEXTROSE 50 % IV SOLN: 10.0000 mL | Freq: Once | INTRAVENOUS | Status: DC

## 2012-05-28 MED ORDER — ADULT MULTIVITAMIN W/MINERALS CH: 1.0000 | ORAL_TABLET | Freq: Every day | ORAL | Status: DC

## 2012-05-28 MED ORDER — SODIUM CHLORIDE 0.9 % IV BOLUS (SEPSIS)
1000.0000 mL | Freq: Once | INTRAVENOUS | Status: AC
  Administered 2012-05-28: 1000 mL via INTRAVENOUS

## 2012-05-28 MED ORDER — VITAMIN B-1 100 MG PO TABS
100.0000 mg | ORAL_TABLET | Freq: Every day | ORAL | Status: DC
  Administered 2012-05-28 – 2012-05-29 (×2): 100 mg via ORAL
  Filled 2012-05-28 (×2): qty 1

## 2012-05-28 MED ORDER — SODIUM CHLORIDE 0.9 % IV SOLN
Freq: Once | INTRAVENOUS | Status: AC
  Administered 2012-05-28: 1000 mL/h via INTRAVENOUS

## 2012-05-28 MED ORDER — DEXTROSE 50 % IV SOLN
INTRAVENOUS | Status: AC
  Filled 2012-05-28: qty 50

## 2012-05-28 MED ORDER — FOLIC ACID 1 MG PO TABS
1.0000 mg | ORAL_TABLET | Freq: Every day | ORAL | Status: DC
  Administered 2012-05-28 – 2012-05-29 (×2): 1 mg via ORAL
  Filled 2012-05-28 (×2): qty 1

## 2012-05-28 MED ORDER — THIAMINE HCL 100 MG/ML IJ SOLN
100.0000 mg | Freq: Every day | INTRAMUSCULAR | Status: DC
  Administered 2012-05-28: 100 mg via INTRAVENOUS
  Filled 2012-05-28: qty 2

## 2012-05-28 MED ORDER — LORAZEPAM 1 MG PO TABS: 0.0000 mg | ORAL_TABLET | Freq: Two times a day (BID) | ORAL | Status: DC

## 2012-05-28 MED ORDER — VITAMIN B-1 100 MG PO TABS: 100.0000 mg | ORAL_TABLET | Freq: Every day | ORAL | Status: DC

## 2012-05-28 MED ORDER — PANTOPRAZOLE SODIUM 40 MG PO TBEC
40.0000 mg | DELAYED_RELEASE_TABLET | Freq: Every day | ORAL | Status: DC
  Administered 2012-05-28 – 2012-05-29 (×2): 40 mg via ORAL
  Filled 2012-05-28 (×2): qty 1

## 2012-05-28 MED ORDER — LORAZEPAM 1 MG PO TABS: 1.0000 mg | ORAL_TABLET | Freq: Four times a day (QID) | ORAL | Status: DC | PRN

## 2012-05-28 MED ORDER — SODIUM CHLORIDE 0.9 % IV SOLN
1000.0000 mL | Freq: Once | INTRAVENOUS | Status: AC
  Administered 2012-05-28: 1000 mL via INTRAVENOUS

## 2012-05-28 MED ORDER — HEPARIN SODIUM (PORCINE) 5000 UNIT/ML IJ SOLN
5000.0000 [IU] | Freq: Three times a day (TID) | INTRAMUSCULAR | Status: DC
  Filled 2012-05-28 (×5): qty 1

## 2012-05-28 MED ORDER — KCL IN DEXTROSE-NACL 20-5-0.45 MEQ/L-%-% IV SOLN
INTRAVENOUS | Status: DC
  Administered 2012-05-28 – 2012-05-29 (×2): via INTRAVENOUS
  Filled 2012-05-28 (×4): qty 1000

## 2012-05-28 MED ORDER — ADULT MULTIVITAMIN W/MINERALS CH
1.0000 | ORAL_TABLET | Freq: Every day | ORAL | Status: DC
  Administered 2012-05-28 – 2012-05-29 (×2): 1 via ORAL
  Filled 2012-05-28 (×2): qty 1

## 2012-05-28 MED ORDER — LORAZEPAM 1 MG PO TABS: 0.0000 mg | ORAL_TABLET | Freq: Four times a day (QID) | ORAL | Status: DC

## 2012-05-28 MED ORDER — THIAMINE HCL 100 MG/ML IJ SOLN
100.0000 mg | Freq: Every day | INTRAMUSCULAR | Status: DC
  Filled 2012-05-28 (×2): qty 1

## 2012-05-28 MED ORDER — SODIUM CHLORIDE 0.9 % IJ SOLN
3.0000 mL | Freq: Two times a day (BID) | INTRAMUSCULAR | Status: DC
  Administered 2012-05-28: 3 mL via INTRAVENOUS

## 2012-05-28 MED ORDER — ONDANSETRON HCL 4 MG/2ML IJ SOLN: 4.0000 mg | Freq: Four times a day (QID) | INTRAMUSCULAR | Status: DC | PRN

## 2012-05-28 MED ORDER — ONDANSETRON HCL 4 MG/2ML IJ SOLN
4.0000 mg | Freq: Once | INTRAMUSCULAR | Status: AC
  Administered 2012-05-28: 4 mg via INTRAVENOUS
  Filled 2012-05-28: qty 2

## 2012-05-28 MED ORDER — FOLIC ACID 1 MG PO TABS: 1.0000 mg | ORAL_TABLET | Freq: Every day | ORAL | Status: DC

## 2012-05-28 MED ORDER — DEXTROSE 50 % IV SOLN
1.0000 | Freq: Once | INTRAVENOUS | Status: AC
  Administered 2012-05-28: 50 mL via INTRAVENOUS

## 2012-05-28 MED ORDER — LORAZEPAM 2 MG/ML IJ SOLN: 1.0000 mg | Freq: Four times a day (QID) | INTRAMUSCULAR | Status: DC | PRN

## 2012-05-28 MED ORDER — LORAZEPAM 2 MG/ML IJ SOLN
1.0000 mg | Freq: Four times a day (QID) | INTRAMUSCULAR | Status: DC | PRN
  Administered 2012-05-28: 4 mg via INTRAVENOUS
  Filled 2012-05-28: qty 2

## 2012-05-28 MED ORDER — ONDANSETRON HCL 4 MG PO TABS: 4.0000 mg | ORAL_TABLET | Freq: Four times a day (QID) | ORAL | Status: DC | PRN

## 2012-05-28 NOTE — H&P (Signed)
[facility] History and Physical  Akshay Spang XBJ:[id number] DOB: 04/02/1980 DOA: 05/28/2012  Referring physician: Dr. Juleen China PCP: Default, Provider, MD  Specialists: none  Chief Complaint: Weakness and diarrhea  HPI: Samuel [name] is a 32 y.o. male  With h/o GERD and Alcohol abuse who presents to the ED after a recent binge of alcohol for the last 3 days of light beer.  Usually drinks 2 cases in 1-2 days.  States that he has also had many bouts of diarrhea but is unable to quantify.  Yesterday as pt reports he was not feeling well and developed dry heaves.  He denies any bouts of emesis.  Was feeling so week that he decided to come to the ED for further evaluation and recommendations. The problem has been persistent since presence and getting worse.  Nothing he knows of made it better or worse.   In the ED was found to have ketones in urine, tachycardia, hypoglycemia (which he was given 1 am of D 50), leukocytosis of 19.6 with no identifiable infection and reassuring lactic acid level.   Given his acidosis and persistent tachycardia despite fluid administration we were consulted for admission evaluation and recommendations.  Review of Systems: 10 point review of systems reviewed and negative unless otherwise mentioned above.  Past Medical History  Diagnosis Date  . GERD (gastroesophageal reflux disease)   . H/O hiatal hernia     pt states he had an EGD at age 52 and had a hiatal hernia  . Dysphagia   . Diverticulitis of colon with bleeding 2010    Hospitalized in 2010 for "intestinal infection requiring IV antibiotics"  . PTSD (post-traumatic stress disorder)    Past Surgical History  Procedure Laterality Date  . Tonsillectomy    . Esophagogastroduodenoscopy      age 11  . Colonoscopy  05/15/2011    Procedure: COLONOSCOPY;  Surgeon: Hilarie Fredrickson, MD;  Location: [facility] ENDOSCOPY;  Service: Endoscopy;  Laterality: N/A;  . Esophagogastroduodenoscopy  05/15/2011    Procedure:  ESOPHAGOGASTRODUODENOSCOPY (EGD);  Surgeon: Hilarie Fredrickson, MD;  Location: [facility] ENDOSCOPY;  Service: Endoscopy;  Laterality: N/A;  . Givens capsule study  05/30/2011    Procedure: GIVENS CAPSULE STUDY;  Surgeon: Hilarie Fredrickson, MD;  Location: [facility] ENDOSCOPY;  Service: Endoscopy;  Laterality: N/A;  Givens Capsule study.   Social History:  reports that he has never smoked. His smokeless tobacco use includes Chew. He reports that he drinks about 1.8 ounces of alcohol per week. He reports that he does not use illicit drugs.  where does patient live--home, ALF, SNF? Patient lives at his home  Can patient participate in ADLs? Yes prior to hospitalization  No Known Allergies  Family History  Problem Relation Age of Onset  . Diabetes type II Mother   . Emphysema Father   . Heart attack Father   . Stroke Father   . Diabetes type II Other   . Diabetes type II Maternal Aunt    None other reported.  Prior to Admission medications   Medication Sig Start Date End Date Taking? Authorizing Provider  ranitidine (ZANTAC) 150 MG tablet Take 150 mg by mouth 3 (three) times daily as needed for heartburn.   Yes Historical Provider, MD   Physical Exam: Filed Vitals:   05/28/12 1730 05/28/12 1731 05/28/12 1733 05/28/12 1828  BP:   136/75 136/73  Pulse: 115 115  120  Temp:    98.5 F (36.9 C)  TempSrc:  Resp:    20  SpO2: 94%   98%     General:  Pt in NAD, Alert and Awake  Eyes: EOMI, non icteric  ENT: normal exterior appearance  Neck: supple, no goiter  Cardiovascular: S1 and S2 WNL, rapid rate, no murmurs  Respiratory: CTA BL, no wheezes  Abdomen: soft, NT, ND  Skin: warm and dry  Musculoskeletal: no cyanosis or clubbing  Psychiatric: mood and affect appropriate. Anxious appearing young man  Neurologic: answers questions appropriately moves all extremities  Labs on Admission:  Basic Metabolic Panel:  Recent Labs Lab 05/28/12 1407  NA 143  K 4.6  CL 93*  CO2 13*  GLUCOSE 48*   BUN 12  CREATININE 0.80  CALCIUM 9.5   Liver Function Tests:  Recent Labs Lab 05/28/12 1407  AST 183*  ALT 112*  ALKPHOS 76  BILITOT 0.8  PROT 7.9  ALBUMIN 4.9    Recent Labs Lab 05/28/12 1407  LIPASE 27   No results found for this basename: AMMONIA,  in the last 168 hours CBC:  Recent Labs Lab 05/28/12 1407  WBC 19.6*  NEUTROABS 16.9*  HGB 14.8  HCT 44.2  MCV 91.9  PLT 251   Cardiac Enzymes: No results found for this basename: CKTOTAL, CKMB, CKMBINDEX, TROPONINI,  in the last 168 hours  BNP (last 3 results) No results found for this basename: PROBNP,  in the last 8760 hours CBG:  Recent Labs Lab 05/28/12 1357 05/28/12 1447 05/28/12 1558  GLUCAP 47* 194* 161*    Radiological Exams on Admission: No results found.  EKG: Independently reviewed. Sinus Tachycardia no St elevation or depressions  Assessment/Plan Active Problems:   GERD (gastroesophageal reflux disease)   Alcohol abuse   Abnormal transaminases   Leukocytosis   Hypoglycemia   1. Alcoholic ketoacidosis - Bicarb on BMP 13 which is most likely due to recent binge drinking given history. No source of infection identified. Lactic acid reassuring. Patient non toxic - Place on MIVF's and rehydrate - recheck CMP next AM - monitor on telemetry  2. Hypoglycemia - Likely related to poor oral intake from abdominal discomfort and dry heaves likely related to # 1. - Resolved after 1 amp of d 50 in ED - Place on MIVF please refer to orders  3. Alcohol abuse - likely causing # 1 - CIWA protocol - Also will place npo until phosphorus is checked given history of alcohol use as I would like to avoid refeeding syndrome.  4. Abnormal transaminases - Avoid tylenol - likely due to # 3 - MIVF and recheck enzyme levels next am  5. Leukocytosis - No obvious source of infection on physical exam. - Will monitor on telemetry if patient spikes fever would obtain blood cultures, chest x ray, and  urine culture.  6. GERD - place on protonix  7. DVT  heparin    Code Status: full  Family Communication:patient at bedside. Disposition Plan: Likely 1-2 more days.  Time spent: > 70 minutes  Penny Pia [facility] Pager [phone]  If 7PM-7AM, please contact night-coverage [email or web address] Password [facility] 05/28/2012, 7:03 PM

## 2012-05-28 NOTE — ED Provider Notes (Signed)
History     CSN: [id number]  Arrival date & time 05/28/12  1328   First MD Initiated Contact with Patient 05/28/12 1424      Chief Complaint  Patient presents with  . Diarrhea  . Nausea  . Weakness    (Consider location/radiation/quality/duration/timing/severity/associated sxs/prior treatment) HPI Comments: Samuel [name] is a 32 y.o. male with a history of alcohol abuse and depression presents emergency department with a chief complaint of diarrhea.  Onset of symptoms began 3 days ago after eating some "bad chicken filet".  Patient's significant other had similar symptoms however she has returned to normal and his diarrhea has continued to worsen.  Patient reports having more than 20 stools in the last 24 hours.  Associated symptoms include generalized weakness, presyncope and nausea.  Patient denies any fevers, night sweats, chills, abdominal pain, vomiting, complete syncope, chest pain or shortness of breath.  Patient reports he drinks daily and had 6-7 beers last night but has not drank any alcohol today.  Patient does not want help with detox.  Patient denies any current hallucinations, suicidal ideations or homicidal ideations at and states that he's never had seizures from alcohol withdrawal.  Patient is a 32 y.o. male presenting with diarrhea and weakness. The history is provided by the patient.  Diarrhea Associated symptoms: no abdominal pain, no chills, no fever, no headaches and no vomiting   Weakness Associated symptoms include nausea and weakness. Pertinent negatives include no abdominal pain, chest pain, chills, congestion, fever, headaches, numbness or vomiting.    Past Medical History  Diagnosis Date  . GERD (gastroesophageal reflux disease)   . H/O hiatal hernia     pt states he had an EGD at age 47 and had a hiatal hernia  . Dysphagia   . Diverticulitis of colon with bleeding 2010    Hospitalized in 2010 for "intestinal infection requiring IV antibiotics"  . PTSD  (post-traumatic stress disorder)     Past Surgical History  Procedure Laterality Date  . Tonsillectomy    . Esophagogastroduodenoscopy      age 61  . Colonoscopy  05/15/2011    Procedure: COLONOSCOPY;  Surgeon: Hilarie Fredrickson, MD;  Location: [facility] ENDOSCOPY;  Service: Endoscopy;  Laterality: N/A;  . Esophagogastroduodenoscopy  05/15/2011    Procedure: ESOPHAGOGASTRODUODENOSCOPY (EGD);  Surgeon: Hilarie Fredrickson, MD;  Location: [facility] ENDOSCOPY;  Service: Endoscopy;  Laterality: N/A;  . Givens capsule study  05/30/2011    Procedure: GIVENS CAPSULE STUDY;  Surgeon: Hilarie Fredrickson, MD;  Location: [facility] ENDOSCOPY;  Service: Endoscopy;  Laterality: N/A;  Givens Capsule study.    Family History  Problem Relation Age of Onset  . Diabetes type II Mother   . Emphysema Father   . Heart attack Father   . Stroke Father   . Diabetes type II Other   . Diabetes type II Maternal Aunt     History  Substance Use Topics  . Smoking status: Never Smoker   . Smokeless tobacco: Current User    Types: Chew     Comment: States he has been chewing tobacco almost daily since age 59  . Alcohol Use: 1.8 oz/week    3 Cans of beer per week     Comment: average six 12oz beers/day, ranges from 3-12 12oz/day      Review of Systems  Constitutional: Negative for fever, chills and appetite change.  HENT: Negative for congestion.   Eyes: Negative for visual disturbance.  Respiratory: Negative for shortness of breath.  Cardiovascular: Negative for chest pain and leg swelling.  Gastrointestinal: Positive for nausea and diarrhea. Negative for vomiting, abdominal pain, blood in stool and abdominal distention.  Genitourinary: Negative for dysuria, urgency and frequency.  Neurological: Positive for weakness and light-headedness. Negative for dizziness, syncope, numbness and headaches.  Psychiatric/Behavioral: Negative for confusion.  All other systems reviewed and are negative.    Allergies  Review of patient's allergies  indicates no known allergies.  Home Medications   Current Outpatient Rx  Name  Route  Sig  Dispense  Refill  . ranitidine (ZANTAC) 150 MG tablet   Oral   Take 150 mg by mouth 3 (three) times daily as needed for heartburn.           BP 143/71  Pulse 129  Temp(Src) 97.9 F (36.6 C) (Oral)  SpO2 97%  Physical Exam  Nursing note and vitals reviewed. Constitutional: He is oriented to person, place, and time.  Cachectic  HENT:  Head: Normocephalic and atraumatic.  Dry mucous membranes  Eyes: Conjunctivae and EOM are normal.  Neck: Normal range of motion.  Cardiovascular:  Tachycardic, intact distal pulses, no pitting edema.  Pulmonary/Chest: Effort normal.  Lungs clear auscultation bilaterally  Abdominal:  Thin abdomen without tenderness to palpation.  Musculoskeletal: Normal range of motion.  Neurological: He is alert and oriented to person, place, and time.  Tremulous  Skin: Skin is warm and dry. No rash noted. He is not diaphoretic.  Psychiatric: He has a normal mood and affect. His behavior is normal.    ED Course  Procedures (including critical care time)  Labs Reviewed  GLUCOSE, CAPILLARY - Abnormal; Notable for the following:    Glucose-Capillary 47 (*)    All other components within normal limits  CBC WITH DIFFERENTIAL - Abnormal; Notable for the following:    WBC 19.6 (*)    Neutrophils Relative 87 (*)    Neutro Abs 16.9 (*)    Lymphocytes Relative 5 (*)    Monocytes Absolute 1.7 (*)    All other components within normal limits  GLUCOSE, CAPILLARY - Abnormal; Notable for the following:    Glucose-Capillary 194 (*)    All other components within normal limits  COMPREHENSIVE METABOLIC PANEL  LIPASE, BLOOD  URINALYSIS, MICROSCOPIC ONLY  URINALYSIS, ROUTINE W REFLEX MICROSCOPIC  POCT I-STAT TROPONIN I   No results found.   No diagnosis found.  BP 136/75  Pulse 115  Temp(Src) 97.9 F (36.6 C) (Oral)  Resp 15  SpO2 94%   MDM  alcoholic  ketoacidosis   32 year old with history of alcohol abuse presents emergency department for dehydration and diarrhea.  Patient arrives tachycardic & hypoglycemic. IV fluids 1 amp D5 given.  Gen. labs reviewed with acidosis, ketonuria, leukocytosis and elevated liver enzymes. Lactic acid pending.The patient appears reasonably stabilized for admission considering the current resources, flow, and capabilities available in the ED at this time, and I doubt any other [facility] requiring further screening and/or treatment in the ED prior to admission.        Jaci Carrel, [place] 05/28/12 1751

## 2012-05-28 NOTE — ED Notes (Signed)
Per Cena Benton, MD allowed to have gingerale.

## 2012-05-28 NOTE — ED Notes (Signed)
Patient reports that he has had more than 20 diarrheal stools in the past 24 hours. Patient also c/o nausea, but no vomiting, Patient also c/o weakness and feeling syncopal. Patient also reports drinking 6-7 beers last night.

## 2012-05-29 LAB — CBC
HCT: 31.6 % — ABNORMAL LOW (ref 39.0–52.0)
MCH: 32 pg (ref 26.0–34.0)
MCHC: 35.4 g/dL (ref 30.0–36.0)
MCV: 90.3 fL (ref 78.0–100.0)
Platelets: 154 10*3/uL (ref 150–400)
RDW: 14.7 % (ref 11.5–15.5)

## 2012-05-29 LAB — COMPREHENSIVE METABOLIC PANEL
ALT: 59 U/L — ABNORMAL HIGH (ref 0–53)
Albumin: 3.1 g/dL — ABNORMAL LOW (ref 3.5–5.2)
Alkaline Phosphatase: 46 U/L (ref 39–117)
BUN: 8 mg/dL (ref 6–23)
Chloride: 101 mEq/L (ref 96–112)
Potassium: 3.9 mEq/L (ref 3.5–5.1)
Sodium: 135 mEq/L (ref 135–145)
Total Bilirubin: 0.9 mg/dL (ref 0.3–1.2)
Total Protein: 5 g/dL — ABNORMAL LOW (ref 6.0–8.3)
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
## 2012-05-29 NOTE — Progress Notes (Signed)
Discharge instructions given to pt, verbalized understanding. Left the unit in stable condition. 

## 2012-05-29 NOTE — Discharge Summary (Signed)
Physician Discharge Summary  Samuel [name]:[id number] DOB: 1980/07/21 DOA: 05/28/2012  PCP: Default, Provider, MD  Admit date: 05/28/2012 Discharge date: 05/29/2012  Time spent: >35 minutes  Recommendations for Outpatient Follow-up:  1. You are to follow up with your primary care physician in 1-2 weeks.  Also you are to avoid alcohol given your recent hospitalization. 2. Also follow up on AST and ALT which have been trending down during hospitalization.  Discharge Diagnoses:  Active Problems:   GERD (gastroesophageal reflux disease)   Alcohol abuse   Abnormal transaminases   Leukocytosis   Hypoglycemia   Alcoholic ketoacidosis   Discharge Condition: stable  Diet recommendation: regular diet  Filed Weights   05/29/12 0521  Weight: 53.162 kg (117 lb 3.2 oz)    History of present illness:  32 y/o presenting with alcoholic ketoacidosis manifesting as abdominal discomfort and nausea with emesis.  Hospital Course:  1. Alcoholic ketoacidosis - Bicarb on BMP 13 which is most likely due to recent binge drinking given history. No source of infection identified. Lactic acid reassuring. Patient non toxic  - Resolved with MIVF's and improved oral intake. - serum ketones negative. - No red flags on telemetry reported to me.  Heart rate 88 on last check patient with no chest pain or palpitations.  2. Hypoglycemia  - Resolved and likely 2ary to # 1.   - With resolution of # 1 and po intake has improved. Patient's last blood glucose on BMP 91. No hypoglycemic symptoms on day of discharge. - Patient to follow up with primary care physician.  3. Alcohol abuse  - likely causing # 1  - Discussed with social worker who will provide resources for alcohol cessation. - Phosphorus normal at 2.6 on last check - Recommended alcohol cessation.  4. Abnormal transaminases  - Avoid tylenol  - likely due to # 3  - Currently trending down.  5. Leukocytosis  - No obvious source of infection  on physical exam.  - resolved without antibiotics. Likely due to stress reaction from # 1. - no fevers reported.  6. GERD  - Continue home regimen.   Procedures:  None  Consultations:  none  Discharge Exam: Filed Vitals:   05/28/12 1828 05/28/12 2009 05/28/12 2155 05/29/12 0521  BP: 136/73 121/71 129/70 106/72  Pulse: 120 121 120 88  Temp: 98.5 F (36.9 C) 98.7 F (37.1 C) 98.6 F (37 C) 97.9 F (36.6 C)  TempSrc:  Oral  Oral  Resp: 20 20 18 20   Height:    5\' 6"  (1.676 m)  Weight:    53.162 kg (117 lb 3.2 oz)  SpO2: 98% 97% 97% 99%    General: Pt in NAD, Alert and Awake. Appears calm Cardiovascular: RRR, No MRG Respiratory: CTA BL, no wheezes Abdomen: soft, NT, ND  Discharge Instructions  Discharge Orders   Future Orders Complete By Expires     Call MD for:  extreme fatigue  As directed     Call MD for:  temperature >100.4  As directed     Diet - low sodium heart healthy  As directed     Discharge instructions  As directed     Comments:      Please be sure to follow up with your primary care physician in 1-2 weeks or sooner should you have any new concerns.    Increase activity slowly  As directed         Medication List    TAKE these medications  ranitidine 150 MG tablet  Commonly known as:  ZANTAC  Take 150 mg by mouth 3 (three) times daily as needed for heartburn.           Follow-up Information   Follow up with [facility]. (Walk-in clinic between 8am-5pm)    Contact information:   [address] [place], [place], [place] [address] [phone]      Follow up with [facility]. (Walk-in clinic between 8am-12pm)    Contact information:   [address] [place], [place] [address] [phone]      Follow up with Standley Dakins, MD. (walk in 10am -7pm Monday through [facility])    Contact information:   [facility] [address] [place] [address] [phone]        The results  of significant diagnostics from this hospitalization (including imaging, microbiology, ancillary and laboratory) are listed below for reference.    Significant Diagnostic Studies: No results found.  Microbiology: No results found for this or any previous visit (from the past 240 hour(s)).   Labs: Basic Metabolic Panel:  Recent Labs Lab 05/28/12 1407 05/28/12 2044 05/29/12 0510  NA 143  --  135  K 4.6  --  3.9  CL 93*  --  101  CO2 13*  --  25  GLUCOSE 48*  --  91  BUN 12  --  8  CREATININE 0.80  --  0.70  CALCIUM 9.5  --  8.0*  MG  --  1.5  --   PHOS  --  2.6  --    Liver Function Tests:  Recent Labs Lab 05/28/12 1407 05/29/12 0510  AST 183* 78*  ALT 112* 59*  ALKPHOS 76 46  BILITOT 0.8 0.9  PROT 7.9 5.0*  ALBUMIN 4.9 3.1*    Recent Labs Lab 05/28/12 1407  LIPASE 27   No results found for this basename: AMMONIA,  in the last 168 hours CBC:  Recent Labs Lab 05/28/12 1407 05/29/12 0510  WBC 19.6* 8.8  NEUTROABS 16.9*  --   HGB 14.8 11.2*  HCT 44.2 31.6*  MCV 91.9 90.3  PLT 251 154   Cardiac Enzymes: No results found for this basename: CKTOTAL, CKMB, CKMBINDEX, TROPONINI,  in the last 168 hours BNP: BNP (last 3 results) No results found for this basename: PROBNP,  in the last 8760 hours CBG:  Recent Labs Lab 05/28/12 1357 05/28/12 1447 05/28/12 1558  GLUCAP 47* 194* 161*       Signed:  Penny Pia  [facility] 05/29/2012, 2:05 PM

## 2012-05-29 NOTE — ED Provider Notes (Signed)
Medical screening examination/treatment/procedure(s) were performed by non-physician practitioner and as supervising physician I was immediately available for consultation/collaboration.  Raeford Razor, MD 05/29/12 [phone]

## 2012-05-29 NOTE — Progress Notes (Signed)
Spoke with pt concerning PCP and medications. Pt states he will be able to get medications from $4 dollar list and will go to [facility] for PCP. MATCH program was also explained to pt. Co-pay and one time use in one year.

## 2012-06-01 ENCOUNTER — Encounter (HOSPITAL_COMMUNITY): Payer: Self-pay | Admitting: Emergency Medicine

## 2012-06-01 ENCOUNTER — Emergency Department (HOSPITAL_COMMUNITY)
Admission: EM | Admit: 2012-06-01 | Discharge: 2012-06-01 | Discharge status: Against Medical Advice | Payer: Self-pay | Attending: Emergency Medicine | Admitting: Emergency Medicine

## 2012-06-01 DIAGNOSIS — F17200 Nicotine dependence, unspecified, uncomplicated: Secondary | ICD-10-CM | POA: Insufficient documentation

## 2012-06-01 DIAGNOSIS — F4310 Post-traumatic stress disorder, unspecified: Secondary | ICD-10-CM | POA: Insufficient documentation

## 2012-06-01 DIAGNOSIS — F411 Generalized anxiety disorder: Secondary | ICD-10-CM | POA: Insufficient documentation

## 2012-06-01 DIAGNOSIS — F3289 Other specified depressive episodes: Secondary | ICD-10-CM | POA: Insufficient documentation

## 2012-06-01 DIAGNOSIS — F419 Anxiety disorder, unspecified: Secondary | ICD-10-CM

## 2012-06-01 DIAGNOSIS — F329 Major depressive disorder, single episode, unspecified: Secondary | ICD-10-CM

## 2012-06-01 DIAGNOSIS — F1010 Alcohol abuse, uncomplicated: Secondary | ICD-10-CM

## 2012-06-01 DIAGNOSIS — F1020 Alcohol dependence, uncomplicated: Secondary | ICD-10-CM | POA: Insufficient documentation

## 2012-06-01 LAB — COMPREHENSIVE METABOLIC PANEL
ALT: 217 U/L — ABNORMAL HIGH (ref 0–53)
AST: 379 U/L — ABNORMAL HIGH (ref 0–37)
Albumin: 4.2 g/dL (ref 3.5–5.2)
Alkaline Phosphatase: 71 U/L (ref 39–117)
BUN: 9 mg/dL (ref 6–23)
CO2: 26 mEq/L (ref 19–32)
Calcium: 9.5 mg/dL (ref 8.4–10.5)
Chloride: 100 mEq/L (ref 96–112)
Creatinine, Ser: 0.69 mg/dL (ref 0.50–1.35)
GFR calc Af Amer: >90 mL/min (ref >90)
GFR calc non Af Amer: >90 mL/min (ref >90)
Glucose, Bld: 94 mg/dL (ref 70–99)
Potassium: 3.6 mEq/L (ref 3.5–5.1)
Sodium: 140 mEq/L (ref 135–145)
Total Bilirubin: 0.5 mg/dL (ref 0.3–1.2)
Total Protein: 7.1 g/dL (ref 6.0–8.3)

## 2012-06-01 LAB — CBC
HCT: 40.9 % (ref 39.0–52.0)
Hemoglobin: 14.6 g/dL (ref 13.0–17.0)
MCH: 32.2 pg (ref 26.0–34.0)
MCHC: 35.7 g/dL (ref 30.0–36.0)
MCV: 90.3 fL (ref 78.0–100.0)
Platelets: 182 10*3/uL (ref 150–400)
RBC: 4.53 MIL/uL (ref 4.22–5.81)
RDW: 14.5 % (ref 11.5–15.5)
WBC: 4.2 10*3/uL (ref 4.0–10.5)

## 2012-06-01 LAB — ETHANOL: Alcohol, Ethyl (B): 241 mg/dL — ABNORMAL HIGH (ref 0–11)

## 2012-06-01 LAB — RAPID URINE DRUG SCREEN, HOSP PERFORMED
Amphetamines: NOT DETECTED
Barbiturates: NOT DETECTED
Benzodiazepines: NOT DETECTED
Cocaine: NOT DETECTED
Opiates: NOT DETECTED
Tetrahydrocannabinol: NOT DETECTED

## 2012-06-01 MED ORDER — IBUPROFEN 200 MG PO TABS: 600.0000 mg | ORAL_TABLET | Freq: Three times a day (TID) | ORAL | Status: DC | PRN

## 2012-06-01 MED ORDER — ACETAMINOPHEN 325 MG PO TABS: 650.0000 mg | ORAL_TABLET | ORAL | Status: DC | PRN

## 2012-06-01 MED ORDER — ALUM & MAG HYDROXIDE-SIMETH 200-200-20 MG/5ML PO SUSP: 30.0000 mL | ORAL | Status: DC | PRN

## 2012-06-01 MED ORDER — ZOLPIDEM TARTRATE 5 MG PO TABS: 5.0000 mg | ORAL_TABLET | Freq: Every evening | ORAL | Status: DC | PRN

## 2012-06-01 MED ORDER — NICOTINE 21 MG/24HR TD PT24: 21.0000 mg | MEDICATED_PATCH | Freq: Every day | TRANSDERMAL | Status: DC

## 2012-06-01 MED ORDER — FAMOTIDINE 20 MG PO TABS: 20.0000 mg | ORAL_TABLET | Freq: Every day | ORAL | Status: DC

## 2012-06-01 MED ORDER — ONDANSETRON HCL 4 MG PO TABS: 4.0000 mg | ORAL_TABLET | Freq: Three times a day (TID) | ORAL | Status: DC | PRN

## 2012-06-01 NOTE — ED Provider Notes (Signed)
History     CSN: [id number]  Arrival date & time 06/01/12  1859   First MD Initiated Contact with Patient 06/01/12 1934      Chief Complaint  Patient presents with  . Medical Clearance    (Consider location/radiation/quality/duration/timing/severity/associated sxs/prior treatment) HPI  Pt to the ED for help with his severe anxiety and PTSD. He was seen her last week for abdominal complaints of rectal bleeding and diarrhea. He says that he was in the [employer] for 4 years and that he had some traumatizing childhood experiences which are at the root of his PTSD. He is concerned that he may be schizophrenic because of the way his mind races. His 53 year old daughter has been interacting and having strange men in their 30s coming around the house at night. This is what is causing his anxiety exacerbation. He is unsure of if he is paranoid, hallucinating or having delusions. When he is stressed he discontinues keeping up with his ADLs (eating, drinking, sleeping). Thoughts of SI/HI but doesn't think he would act on it. He admits to being a Binge drinker to fall asleep and to numb the anxiety.  Past Medical History  Diagnosis Date  . GERD (gastroesophageal reflux disease)   . H/O hiatal hernia     pt states he had an EGD at age 79 and had a hiatal hernia  . Dysphagia   . Diverticulitis of colon with bleeding 2010    Hospitalized in 2010 for "intestinal infection requiring IV antibiotics"  . PTSD (post-traumatic stress disorder)     Past Surgical History  Procedure Laterality Date  . Tonsillectomy    . Esophagogastroduodenoscopy      age 89  . Colonoscopy  05/15/2011    Procedure: COLONOSCOPY;  Surgeon: Hilarie Fredrickson, MD;  Location: [facility] ENDOSCOPY;  Service: Endoscopy;  Laterality: N/A;  . Esophagogastroduodenoscopy  05/15/2011    Procedure: ESOPHAGOGASTRODUODENOSCOPY (EGD);  Surgeon: Hilarie Fredrickson, MD;  Location: [facility] ENDOSCOPY;  Service: Endoscopy;  Laterality: N/A;  . Givens capsule study   05/30/2011    Procedure: GIVENS CAPSULE STUDY;  Surgeon: Hilarie Fredrickson, MD;  Location: [facility] ENDOSCOPY;  Service: Endoscopy;  Laterality: N/A;  Givens Capsule study.    Family History  Problem Relation Age of Onset  . Diabetes type II Mother   . Emphysema Father   . Heart attack Father   . Stroke Father   . Diabetes type II Other   . Diabetes type II Maternal Aunt     History  Substance Use Topics  . Smoking status: Never Smoker   . Smokeless tobacco: Current User    Types: Chew     Comment: States he has been chewing tobacco almost daily since age 76  . Alcohol Use: 1.8 oz/week    3 Cans of beer per week     Comment: average six 12oz beers/day, ranges from 3-12 12oz/day      Review of Systems  All other systems reviewed and are negative.    Allergies  Review of patient's allergies indicates no known allergies.  Home Medications   Current Outpatient Rx  Name  Route  Sig  Dispense  Refill  . ibuprofen (ADVIL,MOTRIN) 200 MG tablet   Oral   Take 200 mg by mouth every 6 (six) hours as needed for pain.         . ranitidine (ZANTAC) 150 MG tablet   Oral   Take 150 mg by mouth 3 (three) times daily as needed  for heartburn.           BP 125/77  Pulse 120  Temp(Src) 98.4 F (36.9 C) (Oral)  Resp 22  Wt 115 lb 2 oz (52.22 kg)  BMI 18.59 kg/m2  SpO2 97%  Physical Exam  Nursing note and vitals reviewed. Constitutional: He appears well-developed and well-nourished. No distress.  HENT:  Head: Normocephalic and atraumatic.  Eyes: Pupils are equal, round, and reactive to light.  Neck: Normal range of motion. Neck supple.  Cardiovascular: Normal rate and regular rhythm.   Pulmonary/Chest: Effort normal.  Abdominal: Soft.  Neurological: He is alert.  Skin: Skin is warm and dry.  Psychiatric: His mood appears anxious. He exhibits a depressed mood. He expresses no homicidal and no suicidal ideation. He expresses no suicidal plans and no homicidal plans.    ED  Course  Procedures (including critical care time)  Labs Reviewed  COMPREHENSIVE METABOLIC PANEL - Abnormal; Notable for the following:    AST 379 (*)    ALT 217 (*)    All other components within normal limits  ETHANOL - Abnormal; Notable for the following:    Alcohol, Ethyl (B) 241 (*)    All other components within normal limits  CBC  URINE RAPID DRUG SCREEN (HOSP PERFORMED)   No results found.   1. PTSD (post-traumatic stress disorder)   2. Anxiety   3. Depression   4. Alcohol consumption binge drinking       MDM  ACT consulted, holding orders placed, med rec done.  Pt left AMA while I was in another exam room. No SI/HI.  He did not want to be taken back to the psych ED or his wife to leave.        Dorthula Matas, PA-C 06/01/12 2224

## 2012-06-01 NOTE — ED Notes (Signed)
Pt c/o worsening depression/anxiety recently. Pt experiencing issues with 34 yo daughter and her acquaintances. Pt states he has not been taking his medications. Pt states his thoughts are scattered and he is withdrawing from family. Pt and wife very polite, calm and seeking help for depression and etoh consumption.

## 2012-06-01 NOTE — ED Notes (Signed)
Pt seen by security and searched.    Mother has taken belongings    .

## 2012-06-06 NOTE — ED Provider Notes (Signed)
Medical screening examination/treatment/procedure(s) were performed by non-physician practitioner and as supervising physician I was immediately available for consultation/collaboration.  Raeford Razor, MD 06/06/12 1302

## 2012-06-08 ENCOUNTER — Emergency Department (HOSPITAL_COMMUNITY)
Admission: EM | Admit: 2012-06-08 | Discharge: 2012-06-09 | Disposition: A | Discharge status: Home / Self Care | Payer: Self-pay | Attending: Emergency Medicine | Admitting: Emergency Medicine

## 2012-06-08 DIAGNOSIS — G4700 Insomnia, unspecified: Secondary | ICD-10-CM | POA: Insufficient documentation

## 2012-06-08 DIAGNOSIS — R7989 Other specified abnormal findings of blood chemistry: Secondary | ICD-10-CM | POA: Insufficient documentation

## 2012-06-08 DIAGNOSIS — F3289 Other specified depressive episodes: Secondary | ICD-10-CM | POA: Insufficient documentation

## 2012-06-08 DIAGNOSIS — Z8719 Personal history of other diseases of the digestive system: Secondary | ICD-10-CM | POA: Insufficient documentation

## 2012-06-08 DIAGNOSIS — F329 Major depressive disorder, single episode, unspecified: Secondary | ICD-10-CM

## 2012-06-08 DIAGNOSIS — Z8659 Personal history of other mental and behavioral disorders: Secondary | ICD-10-CM | POA: Insufficient documentation

## 2012-06-08 DIAGNOSIS — F411 Generalized anxiety disorder: Secondary | ICD-10-CM | POA: Insufficient documentation

## 2012-06-08 DIAGNOSIS — K219 Gastro-esophageal reflux disease without esophagitis: Secondary | ICD-10-CM | POA: Insufficient documentation

## 2012-06-08 DIAGNOSIS — Z79899 Other long term (current) drug therapy: Secondary | ICD-10-CM | POA: Insufficient documentation

## 2012-06-08 DIAGNOSIS — F1010 Alcohol abuse, uncomplicated: Secondary | ICD-10-CM | POA: Insufficient documentation

## 2012-06-08 NOTE — ED Notes (Signed)
Pt requesting detox from alcohol. Pt last drink was this afternoon.

## 2012-06-09 LAB — COMPREHENSIVE METABOLIC PANEL
Alkaline Phosphatase: 65 U/L (ref 39–117)
BUN: 6 mg/dL (ref 6–23)
CO2: 29 mEq/L (ref 19–32)
GFR calc Af Amer: >90 mL/min (ref >90)
GFR calc non Af Amer: >90 mL/min (ref >90)
Glucose, Bld: 132 mg/dL — ABNORMAL HIGH (ref 70–99)
Potassium: 4.7 mEq/L (ref 3.5–5.1)
Total Bilirubin: 0.5 mg/dL (ref 0.3–1.2)
Total Protein: 6.7 g/dL (ref 6.0–8.3)

## 2012-06-09 LAB — CBC
HCT: 42.7 % (ref 39.0–52.0)
Hemoglobin: 14.8 g/dL (ref 13.0–17.0)
MCHC: 34.7 g/dL (ref 30.0–36.0)
RBC: 4.61 MIL/uL (ref 4.22–5.81)

## 2012-06-09 LAB — ETHANOL: Alcohol, Ethyl (B): 370 mg/dL — ABNORMAL HIGH (ref 0–11)

## 2012-06-09 MED ORDER — LORAZEPAM 1 MG PO TABS
1.0000 mg | ORAL_TABLET | Freq: Four times a day (QID) | ORAL | Status: DC | PRN
  Filled 2012-06-09: qty 1

## 2012-06-09 MED ORDER — LORAZEPAM 1 MG PO TABS
0.0000 mg | ORAL_TABLET | Freq: Four times a day (QID) | ORAL | Status: DC
Start: 2012-06-09 — End: 2012-06-09
  Administered 2012-06-09: 2 mg via ORAL
  Filled 2012-06-09: qty 2

## 2012-06-09 MED ORDER — ADULT MULTIVITAMIN W/MINERALS CH
1.0000 | ORAL_TABLET | Freq: Every day | ORAL | Status: DC
  Administered 2012-06-09: 1 via ORAL
  Filled 2012-06-09: qty 1

## 2012-06-09 MED ORDER — NICOTINE 21 MG/24HR TD PT24
21.0000 mg | MEDICATED_PATCH | Freq: Every day | TRANSDERMAL | Status: DC
  Administered 2012-06-09 (×2): 21 mg via TRANSDERMAL
  Filled 2012-06-09 (×2): qty 1

## 2012-06-09 MED ORDER — THIAMINE HCL 100 MG/ML IJ SOLN: 100.0000 mg | Freq: Every day | INTRAMUSCULAR | Status: DC

## 2012-06-09 MED ORDER — IBUPROFEN 200 MG PO TABS: 600.0000 mg | ORAL_TABLET | Freq: Three times a day (TID) | ORAL | Status: DC | PRN

## 2012-06-09 MED ORDER — VITAMIN B-1 100 MG PO TABS
100.0000 mg | ORAL_TABLET | Freq: Every day | ORAL | Status: DC
  Administered 2012-06-09: 100 mg via ORAL
  Filled 2012-06-09: qty 1

## 2012-06-09 MED ORDER — FAMOTIDINE 20 MG PO TABS
20.0000 mg | ORAL_TABLET | Freq: Two times a day (BID) | ORAL | Status: DC
  Administered 2012-06-09 (×2): 20 mg via ORAL
  Filled 2012-06-09 (×2): qty 1

## 2012-06-09 MED ORDER — ALUM & MAG HYDROXIDE-SIMETH 200-200-20 MG/5ML PO SUSP
30.0000 mL | ORAL | Status: DC | PRN
Start: 2012-06-09 — End: 2012-06-09

## 2012-06-09 MED ORDER — ZOLPIDEM TARTRATE 5 MG PO TABS: 5.0000 mg | ORAL_TABLET | Freq: Every evening | ORAL | Status: DC | PRN

## 2012-06-09 MED ORDER — FOLIC ACID 1 MG PO TABS
1.0000 mg | ORAL_TABLET | Freq: Every day | ORAL | Status: DC
Start: 2012-06-09 — End: 2012-06-09
  Administered 2012-06-09: 1 mg via ORAL
  Filled 2012-06-09: qty 1

## 2012-06-09 MED ORDER — LORAZEPAM 1 MG PO TABS: 0.0000 mg | ORAL_TABLET | Freq: Two times a day (BID) | ORAL | Status: DC

## 2012-06-09 MED ORDER — LORAZEPAM 1 MG PO TABS: 1.0000 mg | ORAL_TABLET | Freq: Three times a day (TID) | ORAL | Status: DC | PRN

## 2012-06-09 MED ORDER — ONDANSETRON HCL 4 MG PO TABS: 4.0000 mg | ORAL_TABLET | Freq: Three times a day (TID) | ORAL | Status: DC | PRN

## 2012-06-09 MED ORDER — LORAZEPAM 2 MG/ML IJ SOLN: 1.0000 mg | Freq: Four times a day (QID) | INTRAMUSCULAR | Status: DC | PRN

## 2012-06-09 NOTE — ED Provider Notes (Signed)
History     CSN: [id number]  Arrival date & time 06/08/12  2247   First MD Initiated Contact with Patient 06/08/12 2326      Chief Complaint  Patient presents with  . Medical Clearance    (Consider location/radiation/quality/duration/timing/severity/associated sxs/prior treatment) HPI  Nicolaas Savo is a 32 y.o. male requesting detox from alcohol. Patient states that he is a binge drinker, he drinks beer only. He has been drinking daily for greater than 2 weeks. Last alcohol intake was this afternoon. He reports an increase in his level of depression, he denies suicidal ideation, homicidal ideation, hallucinations, drug abuse. States that he has had heart racing and "shakes" from alcohol withdrawal he is never had a seizure or any hallucinations. Patient has a family history of alcohol abuse. Patient's grandmother is ill and he is also having issues with his daughter this is setting off his depression and anxiety. Patient takes Xanax for anxiety. Patient also endorses and insomnia. Patient denies chest pain, shortness of breath, abdominal pain, change in bowel or bladder habits.   Past Medical History  Diagnosis Date  . GERD (gastroesophageal reflux disease)   . H/O hiatal hernia     pt states he had an EGD at age 22 and had a hiatal hernia  . Dysphagia   . Diverticulitis of colon with bleeding 2010    Hospitalized in 2010 for "intestinal infection requiring IV antibiotics"  . PTSD (post-traumatic stress disorder)     Past Surgical History  Procedure Laterality Date  . Tonsillectomy    . Esophagogastroduodenoscopy      age 40  . Colonoscopy  05/15/2011    Procedure: COLONOSCOPY;  Surgeon: Hilarie Fredrickson, MD;  Location: [facility] ENDOSCOPY;  Service: Endoscopy;  Laterality: N/A;  . Esophagogastroduodenoscopy  05/15/2011    Procedure: ESOPHAGOGASTRODUODENOSCOPY (EGD);  Surgeon: Hilarie Fredrickson, MD;  Location: [facility] ENDOSCOPY;  Service: Endoscopy;  Laterality: N/A;  . Givens capsule study   05/30/2011    Procedure: GIVENS CAPSULE STUDY;  Surgeon: Hilarie Fredrickson, MD;  Location: [facility] ENDOSCOPY;  Service: Endoscopy;  Laterality: N/A;  Givens Capsule study.    Family History  Problem Relation Age of Onset  . Diabetes type II Mother   . Emphysema Father   . Heart attack Father   . Stroke Father   . Diabetes type II Other   . Diabetes type II Maternal Aunt     History  Substance Use Topics  . Smoking status: Never Smoker   . Smokeless tobacco: Current User    Types: Chew     Comment: States he has been chewing tobacco almost daily since age 7  . Alcohol Use: 1.8 oz/week    3 Cans of beer per week     Comment: average six 12oz beers/day, ranges from 3-12 12oz/day      Review of Systems  Constitutional: Negative for fever.  Respiratory: Negative for shortness of breath.   Cardiovascular: Negative for chest pain.  Gastrointestinal: Negative for nausea, vomiting, abdominal pain and diarrhea.  Psychiatric/Behavioral: Positive for sleep disturbance and agitation.  All other systems reviewed and are negative.    Allergies  Review of patient's allergies indicates no known allergies.  Home Medications   Current Outpatient Rx  Name  Route  Sig  Dispense  Refill  . ALPRAZolam (XANAX) 0.5 MG tablet   Oral   Take 0.5 mg by mouth daily.         . ranitidine (ZANTAC) 150 MG tablet  Oral   Take 150 mg by mouth 2 (two) times daily as needed for heartburn.            BP 137/96  Pulse 112  Temp(Src) 98.4 F (36.9 C) (Oral)  Resp 20  SpO2 94%  Physical Exam  Nursing note and vitals reviewed. Constitutional: He is oriented to person, place, and time. He appears well-developed. No distress.  Thin, appears poorly nourished  HENT:  Head: Normocephalic.  Mouth/Throat: Oropharynx is clear and moist.  No tongue fasciculations   Eyes: Conjunctivae and EOM are normal. Pupils are equal, round, and reactive to light. No scleral icterus.  Neck: Normal range of motion.  Neck supple.  Cardiovascular: Normal rate, regular rhythm and intact distal pulses.   Pulmonary/Chest: Effort normal and breath sounds normal. No stridor. No respiratory distress. He has no wheezes. He has no rales. He exhibits no tenderness.  Abdominal: Soft. Bowel sounds are normal. He exhibits no distension and no mass. There is no tenderness. There is no rebound and no guarding.  Musculoskeletal: Normal range of motion.  Neurological: He is alert and oriented to person, place, and time.  Psychiatric: He has a normal mood and affect. His speech is normal and behavior is normal. Thought content normal.    ED Course  Procedures (including critical care time)  Labs Reviewed  CBC - Abnormal; Notable for the following:    WBC 3.3 (*)    All other components within normal limits  COMPREHENSIVE METABOLIC PANEL - Abnormal; Notable for the following:    Glucose, Bld 132 (*)    AST 179 (*)    ALT 134 (*)    All other components within normal limits  ETHANOL - Abnormal; Notable for the following:    Alcohol, Ethyl (B) 370 (*)    All other components within normal limits  URINE RAPID DRUG SCREEN (HOSP PERFORMED)   No results found.   1. Alcohol abuse   2. Elevated liver function tests   3. Depression       MDM   Mico Spark is a 32 y.o. male requesting detox from alcohol. No prior DTs.  Patient is medically cleared for detox/psychiatric evaluation. Act team consulted.   Filed Vitals:   06/08/12 2310  BP: 137/96  Pulse: 112  Temp: 98.4 F (36.9 C)  TempSrc: Oral  Resp: 20  SpO2: 94%      Wynetta Emery, PA-C 06/09/12 0041

## 2012-06-09 NOTE — ED Provider Notes (Addendum)
Pt alert, content. No tremor or shakes. On detox protocol. Act eval/placement pending.   Suzi Roots, MD 06/09/12 [phone]  Act team has evaluated, pt declines rehab/detox, requests d/c. Act has given referrals/resource guide. Pt encourage to f/u with aa, use resource guide for additional community resources.  Recheck pt, no tremor or shakes, sitting upright in bed, talking w family.  Hr 90's, no sweats, no nv. Tolerating po. Again offered inpt tx/placement. Pt states he prefers to f/u as outpt, decliens inpt detox/rehab,  requests d/c.   Discussed w pt, return if worse, feels needs inpt tx, new symptoms, other concern.  Pt notes hx anxiety. Will give limited rx for ativan.   Suzi Roots, MD 06/09/12 1112  Suzi Roots, MD 06/09/12 [phone]

## 2012-06-09 NOTE — ED Notes (Signed)
ACT Team at bedside.  

## 2012-06-09 NOTE — ED Notes (Signed)
Emergency Contact: Katha Hamming [phone] (C)

## 2012-06-09 NOTE — Progress Notes (Signed)
Pt discharged to home. Given DC instructions with girlfriend at bedside. No concerns voiced. Prescription x 1 given for ativan.

## 2012-06-09 NOTE — Progress Notes (Signed)
Pt discharged to home. Left home ambulating to checkout accompanied by girlfriend. Left in good condition. Vwilliams,rn.
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
## 2012-06-09 NOTE — ED Notes (Signed)
Pt states he has been really stressed lately and has been looking for relief in a bottle. Pt states he knows he needs detox and has to stop drinking in order face the stressors he has going on. Pt requesting nicotine patch.

## 2012-06-10 NOTE — ED Provider Notes (Signed)
Medical screening examination/treatment/procedure(s) were performed by non-physician practitioner and as supervising physician I was immediately available for consultation/collaboration.  Ishitha Roper, MD 06/10/12 0506 

## 2012-07-25 ENCOUNTER — Encounter (HOSPITAL_COMMUNITY): Payer: Self-pay | Admitting: Emergency Medicine

## 2012-07-25 ENCOUNTER — Emergency Department (HOSPITAL_COMMUNITY)
Admission: EM | Admit: 2012-07-25 | Discharge: 2012-07-25 | Disposition: A | Discharge status: Home / Self Care | Payer: Self-pay | Attending: Emergency Medicine | Admitting: Emergency Medicine

## 2012-07-25 DIAGNOSIS — K219 Gastro-esophageal reflux disease without esophagitis: Secondary | ICD-10-CM | POA: Insufficient documentation

## 2012-07-25 DIAGNOSIS — F1010 Alcohol abuse, uncomplicated: Secondary | ICD-10-CM | POA: Insufficient documentation

## 2012-07-25 DIAGNOSIS — Z8659 Personal history of other mental and behavioral disorders: Secondary | ICD-10-CM | POA: Insufficient documentation

## 2012-07-25 DIAGNOSIS — R112 Nausea with vomiting, unspecified: Secondary | ICD-10-CM

## 2012-07-25 DIAGNOSIS — F17200 Nicotine dependence, unspecified, uncomplicated: Secondary | ICD-10-CM | POA: Insufficient documentation

## 2012-07-25 DIAGNOSIS — E162 Hypoglycemia, unspecified: Secondary | ICD-10-CM | POA: Insufficient documentation

## 2012-07-25 DIAGNOSIS — Z8719 Personal history of other diseases of the digestive system: Secondary | ICD-10-CM | POA: Insufficient documentation

## 2012-07-25 DIAGNOSIS — R197 Diarrhea, unspecified: Secondary | ICD-10-CM | POA: Insufficient documentation

## 2012-07-25 LAB — URINALYSIS, ROUTINE W REFLEX MICROSCOPIC
Glucose, UA: 500 mg/dL — AB
Leukocytes, UA: NEGATIVE
Nitrite: NEGATIVE
Protein, ur: NEGATIVE mg/dL

## 2012-07-25 LAB — COMPREHENSIVE METABOLIC PANEL
AST: 46 U/L — ABNORMAL HIGH (ref 0–37)
Albumin: 4.8 g/dL (ref 3.5–5.2)
BUN: 12 mg/dL (ref 6–23)
CO2: 17 mEq/L — ABNORMAL LOW (ref 19–32)
Calcium: 9.4 mg/dL (ref 8.4–10.5)
Creatinine, Ser: 0.86 mg/dL (ref 0.50–1.35)
GFR calc non Af Amer: >90 mL/min (ref >90)

## 2012-07-25 LAB — CBC WITH DIFFERENTIAL/PLATELET
Basophils Absolute: 0 10*3/uL (ref 0.0–0.1)
Basophils Relative: 0 % (ref 0–1)
Eosinophils Relative: 0 % (ref 0–5)
HCT: 49.9 % (ref 39.0–52.0)
MCH: 32.3 pg (ref 26.0–34.0)
MCHC: 33.3 g/dL (ref 30.0–36.0)
MCV: 97.1 fL (ref 78.0–100.0)
Monocytes Absolute: 1.3 10*3/uL — ABNORMAL HIGH (ref 0.1–1.0)
RDW: 14.3 % (ref 11.5–15.5)

## 2012-07-25 LAB — LIPASE, BLOOD: Lipase: 20 U/L (ref 11–59)

## 2012-07-25 LAB — ETHANOL: Alcohol, Ethyl (B): 188 mg/dL — ABNORMAL HIGH (ref 0–11)

## 2012-07-25 MED ORDER — ONDANSETRON HCL 4 MG PO TABS: 4.0000 mg | ORAL_TABLET | Freq: Four times a day (QID) | ORAL | Status: DC

## 2012-07-25 MED ORDER — SODIUM CHLORIDE 0.9 % IV BOLUS (SEPSIS)
1000.0000 mL | Freq: Once | INTRAVENOUS | Status: AC
  Administered 2012-07-25: 1000 mL via INTRAVENOUS

## 2012-07-25 MED ORDER — IBUPROFEN 800 MG PO TABS
800.0000 mg | ORAL_TABLET | Freq: Once | ORAL | Status: AC
  Administered 2012-07-25: 800 mg via ORAL
  Filled 2012-07-25: qty 1

## 2012-07-25 MED ORDER — ONDANSETRON HCL 4 MG/2ML IJ SOLN
4.0000 mg | Freq: Once | INTRAMUSCULAR | Status: DC
  Filled 2012-07-25: qty 2

## 2012-07-25 MED ORDER — DEXTROSE 50 % IV SOLN
INTRAVENOUS | Status: AC
  Administered 2012-07-25: 50 mL
  Filled 2012-07-25: qty 50

## 2012-07-25 MED ORDER — ONDANSETRON HCL 4 MG/2ML IJ SOLN
4.0000 mg | Freq: Once | INTRAMUSCULAR | Status: AC
  Administered 2012-07-25: 4 mg via INTRAVENOUS

## 2012-07-25 NOTE — ED Provider Notes (Signed)
History     CSN: [id number]  Arrival date & time 07/25/12  0915   First MD Initiated Contact with Patient 07/25/12 [phone]      Chief Complaint  Patient presents with  . Nausea  . Emesis  . Diarrhea    (Consider location/radiation/quality/duration/timing/severity/associated sxs/prior treatment) HPI Comments: 32 y.o. Male with PMHx of diverticulitis, GERD, and ETOH abuse presents today with hypoglycemia and nausea, vomiting, diarrhea. Pt states that he has no insurance and has not been under a doctor's care, but because both his parents were diabetics at an early age, and he can "tell when his sugar is low," he has monitored his own blood sugar and treated himself through diet. Pt takes no medication for DM. Pt states that "stomach virus" has been going through the house and admits nausea, generalized weakness, vomiting, diarrhea, and decreased oral intake since Monday. Pt admits to drinking 60 oz of beer yesterday and to almost daily drinking. He denies fever, hematemesis, hematochezia, abdominal pain, visual disturbances, numbness, chest pain, dyspnea, or shortness of breath.   Patient is a 32 y.o. male presenting with vomiting and diarrhea.  Emesis Associated symptoms: diarrhea and headaches   Associated symptoms: no abdominal pain   Diarrhea Associated symptoms: headaches and vomiting   Associated symptoms: no abdominal pain, no diaphoresis and no fever     Past Medical History  Diagnosis Date  . GERD (gastroesophageal reflux disease)   . H/O hiatal hernia     pt states he had an EGD at age 42 and had a hiatal hernia  . Dysphagia   . Diverticulitis of colon with bleeding 2010    Hospitalized in 2010 for "intestinal infection requiring IV antibiotics"  . PTSD (post-traumatic stress disorder)     Past Surgical History  Procedure Laterality Date  . Tonsillectomy    . Esophagogastroduodenoscopy      age 25  . Colonoscopy  05/15/2011    Procedure: COLONOSCOPY;  Surgeon: Hilarie Fredrickson, MD;  Location: [facility] ENDOSCOPY;  Service: Endoscopy;  Laterality: N/A;  . Esophagogastroduodenoscopy  05/15/2011    Procedure: ESOPHAGOGASTRODUODENOSCOPY (EGD);  Surgeon: Hilarie Fredrickson, MD;  Location: [facility] ENDOSCOPY;  Service: Endoscopy;  Laterality: N/A;  . Givens capsule study  05/30/2011    Procedure: GIVENS CAPSULE STUDY;  Surgeon: Hilarie Fredrickson, MD;  Location: [facility] ENDOSCOPY;  Service: Endoscopy;  Laterality: N/A;  Givens Capsule study.    Family History  Problem Relation Age of Onset  . Diabetes type II Mother   . Emphysema Father   . Heart attack Father   . Stroke Father   . Diabetes type II Other   . Diabetes type II Maternal Aunt     History  Substance Use Topics  . Smoking status: Never Smoker   . Smokeless tobacco: Current User    Types: Chew     Comment: States he has been chewing tobacco almost daily since age 89  . Alcohol Use: 1.8 oz/week    3 Cans of beer per week     Comment: average six 12oz beers/day, ranges from 3-12 12oz/day      Review of Systems  Constitutional: Positive for appetite change and fatigue. Negative for fever and diaphoresis.  HENT: Negative for neck pain and neck stiffness.   Eyes: Negative for visual disturbance.  Respiratory: Negative for apnea, cough, chest tightness and shortness of breath.   Cardiovascular: Negative for chest pain and palpitations.  Gastrointestinal: Positive for vomiting and diarrhea. Negative for nausea, abdominal  pain and constipation.  Genitourinary: Negative for dysuria, hematuria and flank pain.  Musculoskeletal: Negative for gait problem.  Skin: Negative for rash.  Neurological: Positive for headaches. Negative for dizziness, light-headedness and numbness.    Allergies  Review of patient's allergies indicates no known allergies.  Home Medications   Current Outpatient Rx  Name  Route  Sig  Dispense  Refill  . ranitidine (ZANTAC) 150 MG tablet   Oral   Take 150 mg by mouth 2 (two) times daily as needed for  heartburn.          . ondansetron (ZOFRAN) 4 MG tablet   Oral   Take 1 tablet (4 mg total) by mouth every 6 (six) hours.   12 tablet   0     BP 123/73  Pulse 106  Temp(Src) 97.7 F (36.5 C) (Oral)  Resp 18  SpO2 96%  Physical Exam  Nursing note and vitals reviewed. Constitutional: He is oriented to person, place, and time. He appears well-developed and well-nourished. No distress.  HENT:  Head: Normocephalic and atraumatic.  Eyes: Conjunctivae and EOM are normal.  Neck: Normal range of motion. Neck supple.  No meningeal signs  Cardiovascular: Normal rate, regular rhythm and normal heart sounds.  Exam reveals no gallop and no friction rub.   No murmur heard. Pulmonary/Chest: Effort normal and breath sounds normal. No respiratory distress. He has no wheezes. He has no rales. He exhibits no tenderness.  Abdominal: Soft. Bowel sounds are normal. He exhibits no distension. There is no tenderness. There is no rebound and no guarding.  Musculoskeletal: Normal range of motion. He exhibits no edema and no tenderness.  FROM to upper and lower extremities    Neurological: He is alert and oriented to person, place, and time. No cranial nerve deficit.  Speech is clear and goal oriented, follows commands Sensation normal to light touch and two point discrimination Moves extremities without ataxia, coordination intact Normal gait and balance Normal strength in upper and lower extremities bilaterally including dorsiflexion and plantar flexion, strong and equal grip strength   Skin: Skin is warm and dry. He is not diaphoretic. No erythema.  Psychiatric: He has a normal mood and affect.    ED Course  Procedures (including critical care time)  Medications  ondansetron (ZOFRAN) injection 4 mg (not administered)  dextrose 50 % solution (50 mLs  Given 07/25/12 0945)  sodium chloride 0.9 % bolus 1,000 mL (0 mLs Intravenous Stopped 07/25/12 1121)  ibuprofen (ADVIL,MOTRIN) tablet 800 mg  (800 mg Oral Given 07/25/12 1115)  ondansetron (ZOFRAN) injection 4 mg (4 mg Intravenous Given 07/25/12 1115)    Labs Reviewed  CBC WITH DIFFERENTIAL - Abnormal; Notable for the following:    WBC 21.1 (*)    Neutrophils Relative % 91 (*)    Neutro Abs 19.2 (*)    Lymphocytes Relative 3 (*)    Monocytes Absolute 1.3 (*)    All other components within normal limits  COMPREHENSIVE METABOLIC PANEL - Abnormal; Notable for the following:    Chloride 94 (*)    CO2 17 (*)    Glucose, Bld 52 (*)    AST 46 (*)    All other components within normal limits  URINALYSIS, ROUTINE W REFLEX MICROSCOPIC - Abnormal; Notable for the following:    Glucose, UA 500 (*)    Ketones, ur >80 (*)    All other components within normal limits  ETHANOL - Abnormal; Notable for the following:    Alcohol, Ethyl (  B) 188 (*)    All other components within normal limits  GLUCOSE, CAPILLARY - Abnormal; Notable for the following:    Glucose-Capillary 36 (*)    All other components within normal limits  GLUCOSE, CAPILLARY - Abnormal; Notable for the following:    Glucose-Capillary 180 (*)    All other components within normal limits  LIPASE, BLOOD   No results found.  Discharge Medication List as of 07/25/2012 12:06 PM    START taking these medications   Details  ondansetron (ZOFRAN) 4 MG tablet Take 1 tablet (4 mg total) by mouth every 6 (six) hours., Starting 07/25/2012, Until Discontinued, Print        1. Hypoglycemia   2. Nausea and vomiting       MDM  Pt presents having been exposed to several cases of gastroenteritis in his home which is the likely etiology of this pt's present nausea, vomiting, diarrhea. CBG was 54 at bedside. Started IVF and 50% dextrose. Zofran for nausea and ibuprofen for headache. PE revealed unimpressive abdominal exam. Not tender to palpation. No peritoneal signs. Not concerning for appy, gall bladder, diverticulitis, or surgical abdomen. Pt has been here for ETOH abuse in the  past and I have discussed the importance of sobriety with him. Labs have been ordered. At change of shift, labs are pending, including repeat CBG. Assuming labs are normal, pt is feeling better, CGB is stablized, pt is able to tolerate PO, plan is for discharge with follow up with PCP.    Glade Nurse, PA-C 07/25/12 1502

## 2012-07-25 NOTE — ED Notes (Signed)
Pt c/o NVD and gen abd pain since Sunday night.  Pt states he feels weak and that he is prediabetic and that he is worried that his sugar is low.

## 2012-07-25 NOTE — ED Provider Notes (Signed)
Care assumed from Glade Nurse, PA-C at shift change. 32 y/o male presenting with hypoglycemia. Initial CBG 36, increased to 180 after dextrose infusion. Leukocytosis of 21.1 from viral gastroenteritis going around his house. Awaiting cmp, lipase, etoh, u/a. Chronic alcoholic. Plan is to discharge patient home with outpatient follow up with a PCP.  Results for orders placed during the hospital encounter of 07/25/12  CBC WITH DIFFERENTIAL      Result Value Range   WBC 21.1 (*) 4.0 - 10.5 K/uL   RBC 5.14  4.22 - 5.81 MIL/uL   Hemoglobin 16.6  13.0 - 17.0 g/dL   HCT 40.9  81.1 - 91.4 %   MCV 97.1  78.0 - 100.0 fL   MCH 32.3  26.0 - 34.0 pg   MCHC 33.3  30.0 - 36.0 g/dL   RDW 78.2  95.6 - 21.3 %   Platelets 240  150 - 400 K/uL   Neutrophils Relative % 91 (*) 43 - 77 %   Neutro Abs 19.2 (*) 1.7 - 7.7 K/uL   Lymphocytes Relative 3 (*) 12 - 46 %   Lymphs Abs 0.7  0.7 - 4.0 K/uL   Monocytes Relative 6  3 - 12 %   Monocytes Absolute 1.3 (*) 0.1 - 1.0 K/uL   Eosinophils Relative 0  0 - 5 %   Eosinophils Absolute 0.0  0.0 - 0.7 K/uL   Basophils Relative 0  0 - 1 %   Basophils Absolute 0.0  0.0 - 0.1 K/uL  COMPREHENSIVE METABOLIC PANEL      Result Value Range   Sodium 143  135 - 145 mEq/L   Potassium 4.2  3.5 - 5.1 mEq/L   Chloride 94 (*) 96 - 112 mEq/L   CO2 17 (*) 19 - 32 mEq/L   Glucose, Bld 52 (*) 70 - 99 mg/dL   BUN 12  6 - 23 mg/dL   Creatinine, Ser 0.86  0.50 - 1.35 mg/dL   Calcium 9.4  8.4 - 57.8 mg/dL   Total Protein 7.3  6.0 - 8.3 g/dL   Albumin 4.8  3.5 - 5.2 g/dL   AST 46 (*) 0 - 37 U/L   ALT 22  0 - 53 U/L   Alkaline Phosphatase 79  39 - 117 U/L   Total Bilirubin 0.6  0.3 - 1.2 mg/dL   GFR calc non Af Amer >90  >90 mL/min   GFR calc Af Amer >90  >90 mL/min  LIPASE, BLOOD      Result Value Range   Lipase 20  11 - 59 U/L  ETHANOL      Result Value Range   Alcohol, Ethyl (B) 188 (*) 0 - 11 mg/dL  GLUCOSE, CAPILLARY      Result Value Range   Glucose-Capillary 36 (*) 70  - 99 mg/dL  GLUCOSE, CAPILLARY      Result Value Range   Glucose-Capillary 180 (*) 70 - 99 mg/dL    46:96 AM ETOH 295. CMP unremarkable other than glucose 52 (prior to dextrose infusion) and very mild elevated AST at 46. Patient is resting comfortably in bed, slightly nauseated, AAOx3. Denies abdominal pain. States he is feeling much better, just a little weak. Awaiting U/A. Will give zofran and PO challenge. 12:07 PM Patient tolerated PO challenge. He is in NAD. Nausea improving. Urine with large amount of glucose. Rx zofran at discharge. Stable for discharge. Resource guide given for PCP follow up. Advised against alcohol use. Return precautions discussed. Patient states  understanding of plan and is agreeable.   Trevor Mace, PA-C 07/25/12 1208

## 2012-07-25 NOTE — ED Provider Notes (Signed)
Medical screening examination/treatment/procedure(s) were performed by non-physician practitioner and as supervising physician I was immediately available for consultation/collaboration.   Charles B. Bernette Mayers, MD 07/25/12 [phone]

## 2012-07-25 NOTE — Progress Notes (Signed)
[facility] has seen patient and provided him with Primary Care Resources. °

## 2012-07-25 NOTE — ED Notes (Signed)
cbg 36, PA informed, D50 given

## 2012-07-26 NOTE — ED Provider Notes (Signed)
Medical screening examination/treatment/procedure(s) were performed by non-physician practitioner and as supervising physician I was immediately available for consultation/collaboration.   Devone Tousley, MD 07/26/12 0735 

## 2012-12-09 ENCOUNTER — Emergency Department (HOSPITAL_COMMUNITY)
Admission: EM | Admit: 2012-12-09 | Discharge: 2012-12-09 | Disposition: A | Discharge status: Home / Self Care | Payer: Self-pay | Attending: Emergency Medicine | Admitting: Emergency Medicine

## 2012-12-09 ENCOUNTER — Emergency Department (HOSPITAL_COMMUNITY): Payer: Self-pay

## 2012-12-09 ENCOUNTER — Encounter (HOSPITAL_COMMUNITY): Payer: Self-pay | Admitting: Emergency Medicine

## 2012-12-09 DIAGNOSIS — R110 Nausea: Secondary | ICD-10-CM | POA: Insufficient documentation

## 2012-12-09 DIAGNOSIS — J3489 Other specified disorders of nose and nasal sinuses: Secondary | ICD-10-CM | POA: Insufficient documentation

## 2012-12-09 DIAGNOSIS — Z8719 Personal history of other diseases of the digestive system: Secondary | ICD-10-CM | POA: Insufficient documentation

## 2012-12-09 DIAGNOSIS — R5381 Other malaise: Secondary | ICD-10-CM | POA: Insufficient documentation

## 2012-12-09 DIAGNOSIS — E162 Hypoglycemia, unspecified: Secondary | ICD-10-CM | POA: Insufficient documentation

## 2012-12-09 DIAGNOSIS — F1010 Alcohol abuse, uncomplicated: Secondary | ICD-10-CM | POA: Insufficient documentation

## 2012-12-09 HISTORY — DX: Alcohol abuse, uncomplicated: F10.10

## 2012-12-09 HISTORY — DX: Other specified abnormal findings of blood chemistry: R79.89

## 2012-12-09 HISTORY — DX: Abnormal results of liver function studies: R94.5

## 2012-12-09 HISTORY — DX: Hypoglycemia, unspecified: E16.2

## 2012-12-09 LAB — URINALYSIS W MICROSCOPIC + REFLEX CULTURE
Bilirubin Urine: NEGATIVE
Leukocytes, UA: NEGATIVE
Nitrite: NEGATIVE
Specific Gravity, Urine: 1.021 (ref 1.005–1.030)
Urobilinogen, UA: 0.2 mg/dL (ref 0.0–1.0)
pH: 5 (ref 5.0–8.0)

## 2012-12-09 LAB — CBC WITH DIFFERENTIAL/PLATELET
Basophils Absolute: 0 10*3/uL (ref 0.0–0.1)
Basophils Relative: 0 % (ref 0–1)
HCT: 39.3 % (ref 39.0–52.0)
Lymphocytes Relative: 5 % — ABNORMAL LOW (ref 12–46)
MCHC: 34.6 g/dL (ref 30.0–36.0)
Neutro Abs: 10.3 10*3/uL — ABNORMAL HIGH (ref 1.7–7.7)
Neutrophils Relative %: 90 % — ABNORMAL HIGH (ref 43–77)
Platelets: 203 10*3/uL (ref 150–400)
RDW: 12.7 % (ref 11.5–15.5)
WBC: 11.5 10*3/uL — ABNORMAL HIGH (ref 4.0–10.5)

## 2012-12-09 LAB — RAPID URINE DRUG SCREEN, HOSP PERFORMED
Amphetamines: NOT DETECTED
Barbiturates: NOT DETECTED
Benzodiazepines: NOT DETECTED
Tetrahydrocannabinol: NOT DETECTED

## 2012-12-09 LAB — COMPREHENSIVE METABOLIC PANEL
ALT: 35 U/L (ref 0–53)
Albumin: 4.9 g/dL (ref 3.5–5.2)
Alkaline Phosphatase: 61 U/L (ref 39–117)
Calcium: 9.7 mg/dL (ref 8.4–10.5)
GFR calc Af Amer: >90 mL/min (ref >90)
Glucose, Bld: 65 mg/dL — ABNORMAL LOW (ref 70–99)
Potassium: 4.1 mEq/L (ref 3.5–5.1)
Sodium: 139 mEq/L (ref 135–145)
Total Protein: 7.2 g/dL (ref 6.0–8.3)

## 2012-12-09 LAB — GLUCOSE, CAPILLARY: Glucose-Capillary: 60 mg/dL — ABNORMAL LOW (ref 70–99)

## 2012-12-09 LAB — ETHANOL: Alcohol, Ethyl (B): 92 mg/dL — ABNORMAL HIGH (ref 0–11)

## 2012-12-09 LAB — LIPASE, BLOOD: Lipase: 28 U/L (ref 11–59)

## 2012-12-09 MED ORDER — ONDANSETRON HCL 4 MG/2ML IJ SOLN
4.0000 mg | INTRAMUSCULAR | Status: DC | PRN
  Administered 2012-12-09: 4 mg via INTRAVENOUS
  Filled 2012-12-09: qty 2

## 2012-12-09 MED ORDER — SODIUM CHLORIDE 0.9 % IV SOLN: INTRAVENOUS | Status: DC

## 2012-12-09 MED ORDER — SODIUM CHLORIDE 0.9 % IV SOLN
INTRAVENOUS | Status: DC
  Administered 2012-12-09: 17:00:00 via INTRAVENOUS

## 2012-12-09 MED ORDER — SODIUM CHLORIDE 0.9 % IV BOLUS (SEPSIS): 500.0000 mL | Freq: Once | INTRAVENOUS | Status: DC

## 2012-12-09 NOTE — ED Notes (Signed)
Patient transported to X-ray 

## 2012-12-09 NOTE — ED Provider Notes (Signed)
CSN: [id number]     Arrival date & time 12/09/12  1600 History   First MD Initiated Contact with Patient 12/09/12 1625     Chief Complaint  Patient presents with  . Hypoglycemia    HPI Pt was seen at 1635. Per pt, c/o gradual onset and persistence of constant generalized weakness and fatigue that began this morning. Pt states he has been drinking etoh "heavily" this past week, esp last night. States he woke up this morning feeling "shakey," weak, fatigued, and nauseated. States he was unable to eat today due to his symptoms. States he has hx of these symptoms intermittently for the past several years, and has been dx with "low blood sugar." States he has not f/u with PMD or Endo MD as previously instructed.  Pt also c/o gradual onset and persistence of constant nasal and sinus congestion for the past several weeks. States he has not taken any meds for same. Denies sore throat, no fevers, no cough/SOB, no CP/palpitations, no abd pain, no vomiting/diarrhea, no back pain.     Past Medical History  Diagnosis Date  . GERD (gastroesophageal reflux disease)   . H/O hiatal hernia     pt states he had an EGD at age 32 and had a hiatal hernia  . Dysphagia   . Diverticulitis of colon with bleeding 2010    Hospitalized in 2010 for "intestinal infection requiring IV antibiotics"  . PTSD (post-traumatic stress disorder)   . Alcohol abuse   . Hypoglycemia     recurrent  . Elevated LFTs    Past Surgical History  Procedure Laterality Date  . Tonsillectomy    . Esophagogastroduodenoscopy      age 32  . Colonoscopy  05/15/2011    Procedure: COLONOSCOPY;  Surgeon: Hilarie Fredrickson, MD;  Location: [facility] ENDOSCOPY;  Service: Endoscopy;  Laterality: N/A;  . Esophagogastroduodenoscopy  05/15/2011    Procedure: ESOPHAGOGASTRODUODENOSCOPY (EGD);  Surgeon: Hilarie Fredrickson, MD;  Location: [facility] ENDOSCOPY;  Service: Endoscopy;  Laterality: N/A;  . Givens capsule study  05/30/2011    Procedure: GIVENS CAPSULE STUDY;  Surgeon:  Hilarie Fredrickson, MD;  Location: [facility] ENDOSCOPY;  Service: Endoscopy;  Laterality: N/A;  Givens Capsule study.   Family History  Problem Relation Age of Onset  . Diabetes type II Mother   . Emphysema Father   . Heart attack Father   . Stroke Father   . Diabetes type II Other   . Diabetes type II Maternal Aunt    History  Substance Use Topics  . Smoking status: Never Smoker   . Smokeless tobacco: Current User    Types: Chew     Comment: States he has been chewing tobacco almost daily since age 32  . Alcohol Use: 1.8 oz/week    3 Cans of beer per week     Comment: average six 12oz beers/day, ranges from 3-12 12oz/day    Review of Systems ROS: Statement: All systems negative except as marked or noted in the HPI; Constitutional: Negative for fever and chills. +generalized weakness/fatigue, "shakiness." . ; ; Eyes: Negative for eye pain, redness and discharge. ; ; ENMT: Negative for ear pain, hoarseness, sore throat. +nasal congestion, sinus pressure. ; ; Cardiovascular: Negative for chest pain, palpitations, diaphoresis, dyspnea and peripheral edema. ; ; Respiratory: Negative for cough, wheezing and stridor. ; ; Gastrointestinal: +nausea. Negative for vomiting, diarrhea, abdominal pain, blood in stool, hematemesis, jaundice and rectal bleeding. . ; ; Genitourinary: Negative for dysuria, flank pain and  hematuria. ; ; Musculoskeletal: Negative for back pain and neck pain. Negative for swelling and trauma.; ; Skin: Negative for pruritus, rash, abrasions, blisters, bruising and skin lesion.; ; Neuro: Negative for headache, lightheadedness and neck stiffness. Negative for altered level of consciousness , altered mental status, extremity weakness, paresthesias, involuntary movement, seizure and syncope.      Allergies  Review of patient's allergies indicates no known allergies.  Home Medications   Current Outpatient Rx  Name  Route  Sig  Dispense  Refill  . acetaminophen (TYLENOL) 500 MG tablet    Oral   Take 1,000 mg by mouth every 6 (six) hours as needed for pain.         . ranitidine (ZANTAC) 150 MG tablet   Oral   Take 150 mg by mouth 2 (two) times daily as needed for heartburn.          BP 120/71  Pulse 104  Temp(Src) 98.1 F (36.7 C) (Oral)  Resp 20  SpO2 97% Physical Exam 1640: Physical examination:  Nursing notes reviewed; Vital signs and O2 SAT reviewed;  Constitutional: Thin, well hydrated, In no acute distress; Head:  Normocephalic, atraumatic; Eyes: EOMI, PERRL, No scleral icterus; ENMT: TM's clear bilat. +edemetous nasal turbinates bilat with clear rhinorrhea. Mouth and pharynx normal, Mucous membranes moist; Neck: Supple, Full range of motion, No lymphadenopathy; Cardiovascular: Regular rate and rhythm, No gallop; Respiratory: Breath sounds clear & equal bilaterally, No rales, rhonchi, wheezes.  Speaking full sentences with ease, Normal respiratory effort/excursion; Chest: Nontender, Movement normal; Abdomen: Soft, Nontender, Nondistended, Normal bowel sounds; Genitourinary: No CVA tenderness; Extremities: Pulses normal, No tenderness, No edema, No calf edema or asymmetry.; Neuro: AA&Ox3, Major CN grossly intact.  Speech clear. No gross focal motor or sensory deficits in extremities.; Skin: Color normal, Warm, Dry.   ED Course  Procedures   MDM  MDM Reviewed: previous chart, nursing note and vitals Reviewed previous: labs Interpretation: labs and x-ray     Results for orders placed during the hospital encounter of 12/09/12  GLUCOSE, CAPILLARY      Result Value Range   Glucose-Capillary 60 (*) 70 - 99 mg/dL  URINE RAPID DRUG SCREEN (HOSP PERFORMED)      Result Value Range   Opiates NONE DETECTED  NONE DETECTED   Cocaine NONE DETECTED  NONE DETECTED   Benzodiazepines NONE DETECTED  NONE DETECTED   Amphetamines NONE DETECTED  NONE DETECTED   Tetrahydrocannabinol NONE DETECTED  NONE DETECTED   Barbiturates NONE DETECTED  NONE DETECTED  ETHANOL       Result Value Range   Alcohol, Ethyl (B) 92 (*) 0 - 11 mg/dL  CBC WITH DIFFERENTIAL      Result Value Range   WBC 11.5 (*) 4.0 - 10.5 K/uL   RBC 4.16 (*) 4.22 - 5.81 MIL/uL   Hemoglobin 13.6  13.0 - 17.0 g/dL   HCT 16.1  09.6 - 04.5 %   MCV 94.5  78.0 - 100.0 fL   MCH 32.7  26.0 - 34.0 pg   MCHC 34.6  30.0 - 36.0 g/dL   RDW 40.9  81.1 - 91.4 %   Platelets 203  150 - 400 K/uL   Neutrophils Relative % 90 (*) 43 - 77 %   Neutro Abs 10.3 (*) 1.7 - 7.7 K/uL   Lymphocytes Relative 5 (*) 12 - 46 %   Lymphs Abs 0.6 (*) 0.7 - 4.0 K/uL   Monocytes Relative 5  3 - 12 %  Monocytes Absolute 0.6  0.1 - 1.0 K/uL   Eosinophils Relative 0  0 - 5 %   Eosinophils Absolute 0.0  0.0 - 0.7 K/uL   Basophils Relative 0  0 - 1 %   Basophils Absolute 0.0  0.0 - 0.1 K/uL  URINALYSIS W MICROSCOPIC + REFLEX CULTURE      Result Value Range   Color, Urine YELLOW  YELLOW   APPearance CLEAR  CLEAR   Specific Gravity, Urine 1.021  1.005 - 1.030   pH 5.0  5.0 - 8.0   Glucose, UA NEGATIVE  NEGATIVE mg/dL   Hgb urine dipstick SMALL (*) NEGATIVE   Bilirubin Urine NEGATIVE  NEGATIVE   Ketones, ur >80 (*) NEGATIVE mg/dL   Protein, ur NEGATIVE  NEGATIVE mg/dL   Urobilinogen, UA 0.2  0.0 - 1.0 mg/dL   Nitrite NEGATIVE  NEGATIVE   Leukocytes, UA NEGATIVE  NEGATIVE   RBC / HPF 0-2  <3 RBC/hpf  COMPREHENSIVE METABOLIC PANEL      Result Value Range   Sodium 139  135 - 145 mEq/L   Potassium 4.1  3.5 - 5.1 mEq/L   Chloride 96  96 - 112 mEq/L   CO2 17 (*) 19 - 32 mEq/L   Glucose, Bld 65 (*) 70 - 99 mg/dL   BUN 15  6 - 23 mg/dL   Creatinine, Ser 4.09  0.50 - 1.35 mg/dL   Calcium 9.7  8.4 - 81.1 mg/dL   Total Protein 7.2  6.0 - 8.3 g/dL   Albumin 4.9  3.5 - 5.2 g/dL   AST 63 (*) 0 - 37 U/L   ALT 35  0 - 53 U/L   Alkaline Phosphatase 61  39 - 117 U/L   Total Bilirubin 0.7  0.3 - 1.2 mg/dL   GFR calc non Af Amer >90  >90 mL/min   GFR calc Af Amer >90  >90 mL/min  LIPASE, BLOOD      Result Value Range   Lipase  28  11 - 59 U/L   Dg Chest 2 View 12/09/2012   CLINICAL DATA:  Hypoglycemia.  EXAM: CHEST  2 VIEW  COMPARISON:  04/13/2012.  FINDINGS: The heart size and mediastinal contours are within normal limits. Both lungs are clear. The visualized skeletal structures are unremarkable. Bilateral pleural apical scarring appears unchanged compared to prior.  IMPRESSION: No active cardiopulmonary disease.   Electronically Signed   By: Andreas Newport M.D.   On: 12/09/2012 17:19    Results for BRION, SOSSAMON (MRN [id number]) as of 12/09/2012 18:49  Ref. Range 05/29/2012 05:10 06/01/2012 19:53 06/08/2012 23:40 07/25/2012 09:45 12/09/2012 16:41  AST Latest Range: 0-37 U/L 78 (H) 379 (H) 179 (H) 46 (H) 63 (H)  ALT Latest Range: 0-53 U/L 59 (H) 217 (H) 134 (H) 22 35    1830:  Pt with long hx etoh abuse and LFT elevation. Pt has tol PO well while in the ED. States he feels "better" after the IVF and wants to go home now. He is not interested in an etoh detox program at this time. Dx and testing d/w pt and family.  Questions answered.  Verb understanding, agreeable to d/c home with outpt f/u.    Laray Anger, DO 12/11/12 2228

## 2012-12-09 NOTE — ED Notes (Addendum)
Patient states he hasn't "felt right" for a couple days, has had sinus infection for a few weeks with thick hard mucous. Patient reports that he couldn't sleep this morning and that he woke up at 0200 today for nasal congestion, that he felt weak and returned to sleep. Patient reports that when he woke up a second time he felt dizzy, weak, nauseous, and unable to eat. Patient states that these symptoms have correlated with hypoglycemia in the past; that he has not eaten today; also that he drank alcohol heavily last night as well as the rest of this past week.

## 2013-01-25 ENCOUNTER — Emergency Department (HOSPITAL_COMMUNITY)
Admission: EM | Admit: 2013-01-25 | Discharge: 2013-01-25 | Disposition: A | Discharge status: Home / Self Care | Payer: Self-pay | Attending: Emergency Medicine | Admitting: Emergency Medicine

## 2013-01-25 ENCOUNTER — Encounter (HOSPITAL_COMMUNITY): Payer: Self-pay | Admitting: Emergency Medicine

## 2013-01-25 DIAGNOSIS — R1110 Vomiting, unspecified: Secondary | ICD-10-CM

## 2013-01-25 DIAGNOSIS — R112 Nausea with vomiting, unspecified: Secondary | ICD-10-CM | POA: Insufficient documentation

## 2013-01-25 DIAGNOSIS — F1020 Alcohol dependence, uncomplicated: Secondary | ICD-10-CM | POA: Insufficient documentation

## 2013-01-25 DIAGNOSIS — K219 Gastro-esophageal reflux disease without esophagitis: Secondary | ICD-10-CM | POA: Insufficient documentation

## 2013-01-25 DIAGNOSIS — E872 Acidosis, unspecified: Secondary | ICD-10-CM | POA: Insufficient documentation

## 2013-01-25 DIAGNOSIS — F411 Generalized anxiety disorder: Secondary | ICD-10-CM | POA: Insufficient documentation

## 2013-01-25 LAB — COMPREHENSIVE METABOLIC PANEL
ALT: 43 U/L (ref 0–53)
AST: 75 U/L — ABNORMAL HIGH (ref 0–37)
Albumin: 4.8 g/dL (ref 3.5–5.2)
Alkaline Phosphatase: 71 U/L (ref 39–117)
BUN: 16 mg/dL (ref 6–23)
Calcium: 9.4 mg/dL (ref 8.4–10.5)
Chloride: 92 mEq/L — ABNORMAL LOW (ref 96–112)
Creatinine, Ser: 0.88 mg/dL (ref 0.50–1.35)
GFR calc Af Amer: >90 mL/min (ref >90)
Glucose, Bld: 92 mg/dL (ref 70–99)
Potassium: 5.3 mEq/L — ABNORMAL HIGH (ref 3.5–5.1)
Sodium: 136 mEq/L (ref 135–145)
Total Bilirubin: 0.9 mg/dL (ref 0.3–1.2)

## 2013-01-25 LAB — CBC WITH DIFFERENTIAL/PLATELET
Basophils Relative: 0 % (ref 0–1)
HCT: 40 % (ref 39.0–52.0)
Hemoglobin: 14.1 g/dL (ref 13.0–17.0)
MCHC: 35.3 g/dL (ref 30.0–36.0)
Monocytes Absolute: 0.6 10*3/uL (ref 0.1–1.0)
Monocytes Relative: 4 % (ref 3–12)
Neutro Abs: 11.9 10*3/uL — ABNORMAL HIGH (ref 1.7–7.7)
Neutrophils Relative %: 89 % — ABNORMAL HIGH (ref 43–77)
Platelets: 184 10*3/uL (ref 150–400)
RBC: 4.27 MIL/uL (ref 4.22–5.81)
WBC: 13.4 10*3/uL — ABNORMAL HIGH (ref 4.0–10.5)

## 2013-01-25 MED ORDER — LORAZEPAM 2 MG/ML IJ SOLN
1.0000 mg | Freq: Once | INTRAMUSCULAR | Status: AC
  Administered 2013-01-25: 1 mg via INTRAVENOUS
  Filled 2013-01-25: qty 1

## 2013-01-25 MED ORDER — ONDANSETRON 4 MG PO TBDP: 4.0000 mg | ORAL_TABLET | Freq: Three times a day (TID) | ORAL | Status: DC | PRN

## 2013-01-25 MED ORDER — SODIUM CHLORIDE 0.9 % IV BOLUS (SEPSIS)
1000.0000 mL | Freq: Once | INTRAVENOUS | Status: AC
  Administered 2013-01-25: 1000 mL via INTRAVENOUS

## 2013-01-25 NOTE — Progress Notes (Signed)
[facility] provided pt with a list of primary care resources and a [employer] Orange Card application, highlighting [facility].  °

## 2013-01-25 NOTE — ED Provider Notes (Signed)
CSN: [id number]     Arrival date & time 01/25/13  0808 History   First MD Initiated Contact with Patient 01/25/13 [phone]     Chief Complaint  Patient presents with  . Emesis   (Consider location/radiation/quality/duration/timing/severity/associated sxs/prior Treatment) HPI Comments: Patient with h/o alcoholism, anxiety, PTSD -- presents with vomiting and anxiety x 1 day. No fever. No abdominal pain, diarrhea or urinary symptoms. No chest pain/SOB. No LE swelling or skin rashes. Patient states that he has been drinking a 6 pack per day and did not drink as much yesterday because 'I didn't feel like it'. Family member at bedside states he has been drinking more than this. No blood in vomit or stool. No heavy NSAID use. The onset of this condition was acute. The course is constant. Aggravating factors: none. Alleviating factors: none.    Patient is a 32 y.o. male presenting with vomiting. The history is provided by the patient and medical records.  Emesis Associated symptoms: no abdominal pain, no diarrhea, no headaches, no myalgias and no sore throat     Past Medical History  Diagnosis Date  . GERD (gastroesophageal reflux disease)   . H/O hiatal hernia     pt states he had an EGD at age 59 and had a hiatal hernia  . Dysphagia   . Diverticulitis of colon with bleeding 2010    Hospitalized in 2010 for "intestinal infection requiring IV antibiotics"  . PTSD (post-traumatic stress disorder)   . Alcohol abuse   . Hypoglycemia     recurrent  . Elevated LFTs    Past Surgical History  Procedure Laterality Date  . Tonsillectomy    . Esophagogastroduodenoscopy      age 43  . Colonoscopy  05/15/2011    Procedure: COLONOSCOPY;  Surgeon: Hilarie Fredrickson, MD;  Location[facility] ENDOSCOPY;  Service: Endoscopy;  Laterality: N/A;  . Esophagogastroduodenoscopy  05/15/2011    Procedure: ESOPHAGOGASTRODUODENOSCOPY (EGD);  Surgeon: Hilarie Fredrickson, MD;  Location: [facility] ENDOSCOPY;  Service: Endoscopy;  Laterality:  N/A;  . Givens capsule study  05/30/2011    Procedure: GIVENS CAPSULE STUDY;  Surgeon: Hilarie Fredrickson, MD;  Location: [facility] ENDOSCOPY;  Service: Endoscopy;  Laterality: N/A;  Givens Capsule study.   Family History  Problem Relation Age of Onset  . Diabetes type II Mother   . Emphysema Father   . Heart attack Father   . Stroke Father   . Diabetes type II Other   . Diabetes type II Maternal Aunt    History  Substance Use Topics  . Smoking status: Never Smoker   . Smokeless tobacco: Current User    Types: Chew     Comment: States he has been chewing tobacco almost daily since age 14  . Alcohol Use: 1.8 oz/week    3 Cans of beer per week     Comment: average six 12oz beers/day, ranges from 3-12 12oz/day    Review of Systems  Constitutional: Negative for fever.  HENT: Negative for rhinorrhea and sore throat.   Eyes: Negative for redness.  Respiratory: Negative for cough.   Cardiovascular: Negative for chest pain.  Gastrointestinal: Positive for nausea and vomiting. Negative for abdominal pain and diarrhea.  Genitourinary: Negative for dysuria.  Musculoskeletal: Negative for myalgias.  Skin: Negative for rash.  Neurological: Negative for headaches.  Psychiatric/Behavioral: The patient is nervous/anxious.     Allergies  Review of patient's allergies indicates no known allergies.  Home Medications   Current Outpatient Rx  Name  Route  Sig  Dispense  Refill  . acetaminophen (TYLENOL) 500 MG tablet   Oral   Take 1,000 mg by mouth every 6 (six) hours as needed for pain.         . ranitidine (ZANTAC) 150 MG tablet   Oral   Take 150 mg by mouth 2 (two) times daily as needed for heartburn.          BP 136/86  Pulse 98  Temp(Src) 98.8 F (37.1 C) (Oral)  Resp 18  SpO2 100% Physical Exam  Nursing note and vitals reviewed. Constitutional: He appears well-developed and well-nourished.  thin  HENT:  Head: Normocephalic and atraumatic.  Eyes: Conjunctivae are normal. Right  eye exhibits no discharge. Left eye exhibits no discharge.  Neck: Normal range of motion. Neck supple.  Cardiovascular: Normal rate, regular rhythm and normal heart sounds.   Pulmonary/Chest: Effort normal and breath sounds normal.  Abdominal: Soft. Bowel sounds are normal. There is no tenderness. There is no rebound and no guarding.  Neurological: He is alert.  Skin: Skin is warm and dry.  Psychiatric:  Anxious    ED Course  Procedures (including critical care time) Labs Review Labs Reviewed  CBC WITH DIFFERENTIAL - Abnormal; Notable for the following:    WBC 13.4 (*)    Neutrophils Relative % 89 (*)    Neutro Abs 11.9 (*)    Lymphocytes Relative 7 (*)    All other components within normal limits  COMPREHENSIVE METABOLIC PANEL - Abnormal; Notable for the following:    Potassium 5.3 (*)    Chloride 92 (*)    CO2 15 (*)    AST 75 (*)    All other components within normal limits  ETHANOL   Imaging Review No results found.  EKG Interpretation   None      8:43 AM Patient seen and examined. Work-up initiated. Medications ordered. Previous records reviewed.   Vital signs reviewed and are as follows: Filed Vitals:   01/25/13 0814  BP: 136/86  Pulse: 98  Temp: 98.8 F (37.1 C)  Resp: 18   Elevated AG noted. Patient discussed with and seen by Dr. Jeraldine Loots.   Patient doing well throughout ED stay. Nausea improved with ativan. 2L NS given. PO challenge successful.   Will d/c to home. Encouraged alcohol cessation. Patient given outpatient referrals. Encouraged to eat well and hydrate well.   The patient was urged to return to the Emergency Department immediately with worsening of current symptoms, worsening abdominal pain, persistent vomiting, blood noted in stools, fever, or any other concerns. The patient verbalized understanding.     MDM   1. Vomiting   2. Alcoholic ketoacidosis    Vomiting in setting of chronic alcohol use: likely early withdrawal vs alcoholic  gastritis vs alcoholic ketoacidosis. Symptoms well controlled in ED. He appears well. Abd is soft and NT. No imaging indicated with benign exam. AG elevated but has been in past. Mild hyperkalemia but normal kidney function. Feel stable for d/c home and do not suspect any emergent medical conditions.     Renne Crigler, PA-C 01/25/13 1520

## 2013-01-25 NOTE — ED Notes (Signed)
Per pt, started feeling bad yesterday, chills, nausea-unable to keep fluids down-dry heaves this am

## 2013-01-25 NOTE — Progress Notes (Signed)
EDCM notifired by ED RN to provide patient community resources and pcp information.  Patient lives in [place], patient without insurance.  EDCM provided patient with list of free clinics in [facility]  Patient being discharged with RX for zofran.  Patient reports they use [facility].  EDCM called Karin Golden to find price of disintergrating zofran tablets 4mg  ten tablets.  As per pharmacy cost of medication will be ten dollars.  Relayed this information to the patient and patient was agreeable to this price.  Edcm also provided patient a list of pcp's in [place] county who accept self pay patients, list of financial resources in the community such as [employer], urban ministries, IT trainer regarding Medicaid and the [employer] Act for insurance and Public relations account executive for patients without insurance.  This information was given to patient because they expressed they may be interested in receiving medical care in [facility].  Patient thankful for information.  No furhter CM needs at this time.

## 2013-01-25 NOTE — ED Notes (Signed)
Per family states drinking heavy for 5 days-stopped and that's when vomiting started-states increased anxiety

## 2013-01-28 NOTE — ED Provider Notes (Signed)
  Medical screening examination/treatment/procedure(s) were performed by non-physician practitioner and as supervising physician I was immediately available for consultation/collaboration.     Gerhard Munch, MD 01/28/13 1046

## 2013-03-02 ENCOUNTER — Encounter (HOSPITAL_COMMUNITY): Payer: Self-pay | Admitting: Emergency Medicine

## 2013-03-02 ENCOUNTER — Inpatient Hospital Stay (HOSPITAL_COMMUNITY)
Admission: EM | Admit: 2013-03-02 | Discharge: 2013-03-03 | DRG: 641 | Disposition: A | Discharge status: Home / Self Care | Payer: Self-pay | Attending: Internal Medicine | Admitting: Internal Medicine

## 2013-03-02 ENCOUNTER — Inpatient Hospital Stay (HOSPITAL_COMMUNITY): Payer: Self-pay

## 2013-03-02 DIAGNOSIS — F4310 Post-traumatic stress disorder, unspecified: Secondary | ICD-10-CM | POA: Diagnosis present

## 2013-03-02 DIAGNOSIS — K219 Gastro-esophageal reflux disease without esophagitis: Secondary | ICD-10-CM | POA: Diagnosis present

## 2013-03-02 DIAGNOSIS — Z79899 Other long term (current) drug therapy: Secondary | ICD-10-CM

## 2013-03-02 DIAGNOSIS — D72829 Elevated white blood cell count, unspecified: Secondary | ICD-10-CM | POA: Diagnosis present

## 2013-03-02 DIAGNOSIS — E872 Acidosis, unspecified: Principal | ICD-10-CM | POA: Diagnosis present

## 2013-03-02 DIAGNOSIS — D126 Benign neoplasm of colon, unspecified: Secondary | ICD-10-CM

## 2013-03-02 DIAGNOSIS — F411 Generalized anxiety disorder: Secondary | ICD-10-CM | POA: Diagnosis present

## 2013-03-02 DIAGNOSIS — F1010 Alcohol abuse, uncomplicated: Secondary | ICD-10-CM | POA: Diagnosis present

## 2013-03-02 DIAGNOSIS — F329 Major depressive disorder, single episode, unspecified: Secondary | ICD-10-CM | POA: Diagnosis present

## 2013-03-02 DIAGNOSIS — K760 Fatty (change of) liver, not elsewhere classified: Secondary | ICD-10-CM

## 2013-03-02 DIAGNOSIS — F10929 Alcohol use, unspecified with intoxication, unspecified: Secondary | ICD-10-CM | POA: Diagnosis present

## 2013-03-02 DIAGNOSIS — R1115 Cyclical vomiting syndrome unrelated to migraine: Secondary | ICD-10-CM | POA: Diagnosis present

## 2013-03-02 DIAGNOSIS — R1110 Vomiting, unspecified: Secondary | ICD-10-CM

## 2013-03-02 DIAGNOSIS — F3289 Other specified depressive episodes: Secondary | ICD-10-CM | POA: Diagnosis present

## 2013-03-02 DIAGNOSIS — E860 Dehydration: Secondary | ICD-10-CM

## 2013-03-02 LAB — CBC WITH DIFFERENTIAL/PLATELET
Basophils Absolute: 0 10*3/uL (ref 0.0–0.1)
Basophils Relative: 0 % (ref 0–1)
Basophils Relative: 0 % (ref 0–1)
Eosinophils Absolute: 0 10*3/uL (ref 0.0–0.7)
Eosinophils Relative: 0 % (ref 0–5)
HCT: 43 % (ref 39.0–52.0)
HCT: 34.5 % — ABNORMAL LOW (ref 39.0–52.0)
Hemoglobin: 14.7 g/dL (ref 13.0–17.0)
Hemoglobin: 12.1 g/dL — ABNORMAL LOW (ref 13.0–17.0)
Lymphocytes Relative: 3 % — ABNORMAL LOW (ref 12–46)
Lymphocytes Relative: 5 % — ABNORMAL LOW (ref 12–46)
Lymphs Abs: 0.6 10*3/uL — ABNORMAL LOW (ref 0.7–4.0)
MCH: 32.7 pg (ref 26.0–34.0)
MCHC: 34.2 g/dL (ref 30.0–36.0)
MCHC: 35.1 g/dL (ref 30.0–36.0)
MCV: 95.6 fL (ref 78.0–100.0)
MCV: 94 fL (ref 78.0–100.0)
Monocytes Absolute: 1.4 10*3/uL — ABNORMAL HIGH (ref 0.1–1.0)
Monocytes Absolute: 1.1 10*3/uL — ABNORMAL HIGH (ref 0.1–1.0)
Monocytes Relative: 7 % (ref 3–12)
Monocytes Relative: 7 % (ref 3–12)
Neutro Abs: 20.1 10*3/uL — ABNORMAL HIGH (ref 1.7–7.7)
Neutro Abs: 15 10*3/uL — ABNORMAL HIGH (ref 1.7–7.7)
Neutrophils Relative %: 89 % — ABNORMAL HIGH (ref 43–77)
Platelets: 221 10*3/uL (ref 150–400)
RBC: 4.5 MIL/uL (ref 4.22–5.81)
RDW: 12.9 % (ref 11.5–15.5)
WBC: 22.1 10*3/uL — ABNORMAL HIGH (ref 4.0–10.5)

## 2013-03-02 LAB — COMPREHENSIVE METABOLIC PANEL
ALT: 50 U/L (ref 0–53)
AST: 85 U/L — ABNORMAL HIGH (ref 0–37)
Albumin: 5.2 g/dL (ref 3.5–5.2)
Albumin: 3.7 g/dL (ref 3.5–5.2)
Alkaline Phosphatase: 81 U/L (ref 39–117)
Alkaline Phosphatase: 58 U/L (ref 39–117)
BUN: 13 mg/dL (ref 6–23)
CO2: 17 mEq/L — ABNORMAL LOW (ref 19–32)
Chloride: 90 mEq/L — ABNORMAL LOW (ref 96–112)
Chloride: 95 mEq/L — ABNORMAL LOW (ref 96–112)
Creatinine, Ser: 0.88 mg/dL (ref 0.50–1.35)
Creatinine, Ser: 0.83 mg/dL (ref 0.50–1.35)
GFR calc Af Amer: >90 mL/min (ref >90)
GFR calc non Af Amer: >90 mL/min (ref >90)
GFR calc non Af Amer: >90 mL/min (ref >90)
Glucose, Bld: 71 mg/dL (ref 70–99)
Glucose, Bld: 151 mg/dL — ABNORMAL HIGH (ref 70–99)
Potassium: 4.9 mEq/L (ref 3.5–5.1)
Sodium: 132 mEq/L — ABNORMAL LOW (ref 135–145)
Total Bilirubin: 0.7 mg/dL (ref 0.3–1.2)
Total Bilirubin: 0.6 mg/dL (ref 0.3–1.2)
Total Protein: 8 g/dL (ref 6.0–8.3)

## 2013-03-02 LAB — URINALYSIS, ROUTINE W REFLEX MICROSCOPIC
Bilirubin Urine: NEGATIVE
Ketones, ur: >80 mg/dL — AB
Nitrite: NEGATIVE
Protein, ur: NEGATIVE mg/dL
Specific Gravity, Urine: 1.018 (ref 1.005–1.030)
Urobilinogen, UA: 0.2 mg/dL (ref 0.0–1.0)
pH: 5.5 (ref 5.0–8.0)

## 2013-03-02 LAB — PROTIME-INR: INR: 1.23 (ref 0.00–1.49)

## 2013-03-02 LAB — SALICYLATE LEVEL: Salicylate Lvl: <2 mg/dL — ABNORMAL LOW (ref 2.8–20.0)

## 2013-03-02 LAB — RAPID URINE DRUG SCREEN, HOSP PERFORMED
Barbiturates: NOT DETECTED
Cocaine: NOT DETECTED
Opiates: NOT DETECTED
Tetrahydrocannabinol: NOT DETECTED

## 2013-03-02 LAB — ACETAMINOPHEN LEVEL: Acetaminophen (Tylenol), Serum: <15 ug/mL (ref 10–30)

## 2013-03-02 LAB — MAGNESIUM: Magnesium: 1.8 mg/dL (ref 1.5–2.5)

## 2013-03-02 LAB — LIPASE, BLOOD: Lipase: 16 U/L (ref 11–59)

## 2013-03-02 LAB — GLUCOSE, CAPILLARY

## 2013-03-02 LAB — CG4 I-STAT (LACTIC ACID): Lactic Acid, Venous: 2.57 mmol/L — ABNORMAL HIGH (ref 0.5–2.2)

## 2013-03-02 LAB — PHOSPHORUS: Phosphorus: 3 mg/dL (ref 2.3–4.6)

## 2013-03-02 MED ORDER — INFLUENZA VAC SPLIT QUAD 0.5 ML IM SUSP
0.5000 mL | INTRAMUSCULAR | Status: AC
  Administered 2013-03-03: 0.5 mL via INTRAMUSCULAR
  Filled 2013-03-02 (×2): qty 0.5

## 2013-03-02 MED ORDER — LORAZEPAM 1 MG PO TABS: 0.0000 mg | ORAL_TABLET | Freq: Two times a day (BID) | ORAL | Status: DC

## 2013-03-02 MED ORDER — THIAMINE HCL 100 MG/ML IJ SOLN
100.0000 mg | Freq: Every day | INTRAMUSCULAR | Status: DC
  Filled 2013-03-02: qty 1

## 2013-03-02 MED ORDER — ACETAMINOPHEN 325 MG PO TABS: 650.0000 mg | ORAL_TABLET | Freq: Four times a day (QID) | ORAL | Status: DC | PRN

## 2013-03-02 MED ORDER — LORAZEPAM 1 MG PO TABS
0.0000 mg | ORAL_TABLET | Freq: Four times a day (QID) | ORAL | Status: DC
  Administered 2013-03-02: 1 mg via ORAL
  Administered 2013-03-02: 2 mg via ORAL
  Filled 2013-03-02: qty 2
  Filled 2013-03-02: qty 1

## 2013-03-02 MED ORDER — VITAMIN B-1 100 MG PO TABS
100.0000 mg | ORAL_TABLET | Freq: Once | ORAL | Status: AC
  Administered 2013-03-02: 100 mg via ORAL
  Filled 2013-03-02: qty 1

## 2013-03-02 MED ORDER — FOLIC ACID 1 MG PO TABS
1.0000 mg | ORAL_TABLET | Freq: Every day | ORAL | Status: DC
Start: 2013-03-02 — End: 2013-03-03
  Administered 2013-03-03: 1 mg via ORAL
  Filled 2013-03-02: qty 1

## 2013-03-02 MED ORDER — THIAMINE HCL 100 MG/ML IJ SOLN
Freq: Once | INTRAVENOUS | Status: AC
  Administered 2013-03-02: 15:00:00 via INTRAVENOUS
  Filled 2013-03-02: qty 1000

## 2013-03-02 MED ORDER — ONDANSETRON HCL 4 MG/2ML IJ SOLN
4.0000 mg | Freq: Once | INTRAMUSCULAR | Status: AC
  Administered 2013-03-02: 4 mg via INTRAVENOUS
  Filled 2013-03-02: qty 2

## 2013-03-02 MED ORDER — LORAZEPAM 1 MG PO TABS: 1.0000 mg | ORAL_TABLET | Freq: Four times a day (QID) | ORAL | Status: DC | PRN

## 2013-03-02 MED ORDER — LORAZEPAM 2 MG/ML IJ SOLN: 1.0000 mg | Freq: Four times a day (QID) | INTRAMUSCULAR | Status: DC | PRN

## 2013-03-02 MED ORDER — SODIUM CHLORIDE 0.9 % IJ SOLN: 3.0000 mL | Freq: Two times a day (BID) | INTRAMUSCULAR | Status: DC

## 2013-03-02 MED ORDER — ACETAMINOPHEN 650 MG RE SUPP: 650.0000 mg | Freq: Four times a day (QID) | RECTAL | Status: DC | PRN

## 2013-03-02 MED ORDER — SODIUM CHLORIDE 0.9 % IV BOLUS (SEPSIS)
1000.0000 mL | Freq: Once | INTRAVENOUS | Status: AC
  Administered 2013-03-02: 1000 mL via INTRAVENOUS

## 2013-03-02 MED ORDER — ADULT MULTIVITAMIN W/MINERALS CH
1.0000 | ORAL_TABLET | Freq: Every day | ORAL | Status: DC
  Administered 2013-03-03: 1 via ORAL
  Filled 2013-03-02: qty 1

## 2013-03-02 MED ORDER — ONDANSETRON HCL 4 MG PO TABS: 4.0000 mg | ORAL_TABLET | Freq: Four times a day (QID) | ORAL | Status: DC | PRN

## 2013-03-02 MED ORDER — VITAMIN B-1 100 MG PO TABS
100.0000 mg | ORAL_TABLET | Freq: Every day | ORAL | Status: DC
  Administered 2013-03-03: 100 mg via ORAL
  Filled 2013-03-02: qty 1

## 2013-03-02 MED ORDER — SODIUM CHLORIDE 0.9 % IV SOLN
INTRAVENOUS | Status: DC
  Administered 2013-03-02 – 2013-03-03 (×2): via INTRAVENOUS

## 2013-03-02 MED ORDER — ONDANSETRON HCL 4 MG/2ML IJ SOLN: 4.0000 mg | Freq: Four times a day (QID) | INTRAMUSCULAR | Status: DC | PRN

## 2013-03-02 NOTE — ED Notes (Signed)
Woke around 0100 with vomiting, pt has multiple complaints, depression, anxiety, has been drinking ETOH last drink yesterday 2100. Pt pale and tachy in triage

## 2013-03-02 NOTE — ED Notes (Signed)
Bed: WA02 Expected date:  Expected time:  Means of arrival:  Comments: 

## 2013-03-02 NOTE — ED Notes (Signed)
Pt aware of need urine sample. Urinal at bedside

## 2013-03-02 NOTE — ED Provider Notes (Signed)
CSN: [id number]     Arrival date & time 03/02/13  4098 History   First MD Initiated Contact with Patient 03/02/13 1105     Chief Complaint  Patient presents with  . Emesis  . Generalized Body Aches  . Hypoglycemia  . Depression   (Consider location/radiation/quality/duration/timing/severity/associated sxs/prior Treatment) HPI  This a 32 are old male with history of GERD, PTSD, and alcohol abuse who presents with emesis. Patient reports onset of emesis at 1 AM this morning. He reports binge drinking for the last week. He states that it this is how he "copes with increasing stress and anxiety at home."  Patient reports drinking approximately 5 cases of beer and multiple 40 ounce beers in one week.  Last drink was last night at approximately 10 PM. Patient denies any history of withdrawals or DTs. Patient reports emesis without diarrhea. He denies any fevers or abdominal pain. He has had similar presentations like this in the past and was noted to be in alcoholic ketoacidosis. Patient requests help with his anxiety and stress. When asked if he would like help with his drinking he states "I guess." He states he cannot afford outpatient evaluation for depression and anxiety.  Past Medical History  Diagnosis Date  . GERD (gastroesophageal reflux disease)   . H/O hiatal hernia     pt states he had an EGD at age 12 and had a hiatal hernia  . Dysphagia   . Diverticulitis of colon with bleeding 2010    Hospitalized in 2010 for "intestinal infection requiring IV antibiotics"  . PTSD (post-traumatic stress disorder)   . Alcohol abuse   . Hypoglycemia     recurrent  . Elevated LFTs    Past Surgical History  Procedure Laterality Date  . Tonsillectomy    . Esophagogastroduodenoscopy      age 32  . Colonoscopy  05/15/2011    Procedure: COLONOSCOPY;  Surgeon: Hilarie Fredrickson, MD;  Location: [facility] ENDOSCOPY;  Service: Endoscopy;  Laterality: N/A;  . Esophagogastroduodenoscopy  05/15/2011    Procedure:  ESOPHAGOGASTRODUODENOSCOPY (EGD);  Surgeon: Hilarie Fredrickson, MD;  Location: [facility] ENDOSCOPY;  Service: Endoscopy;  Laterality: N/A;  . Givens capsule study  05/30/2011    Procedure: GIVENS CAPSULE STUDY;  Surgeon: Hilarie Fredrickson, MD;  Location: [facility] ENDOSCOPY;  Service: Endoscopy;  Laterality: N/A;  Givens Capsule study.   Family History  Problem Relation Age of Onset  . Diabetes type II Mother   . Emphysema Father   . Heart attack Father   . Stroke Father   . Diabetes type II Other   . Diabetes type II Maternal Aunt    History  Substance Use Topics  . Smoking status: Never Smoker   . Smokeless tobacco: Current User    Types: Chew     Comment: States he has been chewing tobacco almost daily since age 32  . Alcohol Use: 1.8 oz/week    3 Cans of beer per week     Comment: average six 12oz beers/day, ranges from 3-12 12oz/day    Review of Systems  Constitutional: Negative.  Negative for fever.  Respiratory: Negative.  Negative for chest tightness and shortness of breath.   Cardiovascular: Negative.  Negative for chest pain.  Gastrointestinal: Positive for nausea and vomiting. Negative for abdominal pain and diarrhea.  Genitourinary: Negative.  Negative for dysuria.  Musculoskeletal: Negative for back pain.  Skin: Negative for rash.  Neurological: Negative for headaches.  Psychiatric/Behavioral: Negative for suicidal ideas and self-injury.  All other systems reviewed and are negative.    Allergies  Review of patient's allergies indicates no known allergies.  Home Medications   Current Outpatient Rx  Name  Route  Sig  Dispense  Refill  . acetaminophen (TYLENOL) 500 MG tablet   Oral   Take 1,000 mg by mouth every 6 (six) hours as needed for pain.         . ranitidine (ZANTAC) 150 MG tablet   Oral   Take 150 mg by mouth 2 (two) times daily as needed for heartburn.          BP 139/83  Pulse 117  Temp(Src) 98.3 F (36.8 C) (Oral)  Resp 18  SpO2 97% Physical Exam  Nursing  note and vitals reviewed. Constitutional: He is oriented to person, place, and time. No distress.  thin  HENT:  Head: Normocephalic and atraumatic.  Mucous membranes dry  Eyes: Pupils are equal, round, and reactive to light.  Neck: Neck supple.  Cardiovascular: Regular rhythm and normal heart sounds.   No murmur heard. Tachycardia  Pulmonary/Chest: Effort normal and breath sounds normal. No respiratory distress. He has no wheezes.  Abdominal: Soft. Bowel sounds are normal. There is no tenderness. There is no rebound and no guarding.  Musculoskeletal: He exhibits no edema.  Lymphadenopathy:    He has no cervical adenopathy.  Neurological: He is alert and oriented to person, place, and time.  Skin: Skin is warm and dry.  Psychiatric: He has a normal mood and affect.    ED Course  Procedures (including critical care time) Labs Review Labs Reviewed  GLUCOSE, CAPILLARY - Abnormal; Notable for the following:    Glucose-Capillary 60 (*)    All other components within normal limits  CBC WITH DIFFERENTIAL - Abnormal; Notable for the following:    WBC 22.1 (*)    Neutrophils Relative % 91 (*)    Neutro Abs 20.1 (*)    Lymphocytes Relative 3 (*)    Lymphs Abs 0.6 (*)    Monocytes Absolute 1.4 (*)    All other components within normal limits  COMPREHENSIVE METABOLIC PANEL - Abnormal; Notable for the following:    Chloride 90 (*)    CO2 12 (*)    AST 110 (*)    ALT 66 (*)    All other components within normal limits  SALICYLATE LEVEL - Abnormal; Notable for the following:    Salicylate Lvl <2.0 (*)    All other components within normal limits  ETHANOL - Abnormal; Notable for the following:    Alcohol, Ethyl (B) 98 (*)    All other components within normal limits  LIPASE, BLOOD  ACETAMINOPHEN LEVEL  URINE RAPID DRUG SCREEN (HOSP PERFORMED)   Imaging Review No results found.  EKG Interpretation   None       MDM   1. Alcoholic ketoacidosis   2. Dehydration   3. Emesis     Patient presents with emesis in the setting of chronic alcohol abuse. He is thin and dehydrated on exam. Pulse noted to be 132. Abdominal exam is benign and without evidence of tenderness. Labwork was obtained including EtOH, UDS, salicylate level, aspirin level. Lab work is notable for anion gap 35. Patient was given a normal saline bolus and thiamine. Banana bag infusion was also initiated. I had ordered a lactate but suspect patient's anion gap is secondary to alcoholic ketoacidosis. At this time I cannot medically clear the patient for psychiatric evaluation given metabolic arrangement derangement.  Will admit medically.  Shon Baton, MD 03/02/13 [phone]

## 2013-03-02 NOTE — H&P (Signed)
[facility] History and Physical  Blanche Gallien ZOX:[id number] DOB: Apr 09, 1980 DOA: 03/02/2013  Referring physician: ER physician PCP: Default, Provider, MD   Chief Complaint: alcohol intoxication  HPI:  32 year old male with past medical history of alcohol abuse, GERD who presented to [facility] ED 03/02/2013 with intractable nausea and vomiting. His last alcoholic beverage was 1 day prior to this admission, drinking beer. No chest pain, no shortness of breath, no palpitations. No loss of consciousness. No blood in stool. No fever or chills.  In ED, pt was found to have alcohol level of 98. BP was 105/61, HR 110, T max 99.9 F, O2 saturation was 95% on room air. CIWA protocol was initiated. CBC revealed WBC count of 22.1. BMP revealed CO2 of 12 and AG of 35.  CXR did not reveal acute cardiopulmonary process. Urinalysis was unremarkable.   Assessment and Plan:  Principal Problem:   Alcoholic ketoacidosis - admit to stepdown unit - CIWA initiated - continue IV fluids, thiamine, multivitamin and folic acid - repeat admission lab BMP and CBC - UDS negative  Active Problems:   Acute alcohol intoxication - alcohol level is 98 on the admission - as noted above CIWA ordered   GERD (gastroesophageal reflux disease) - continue protonix    Alcohol abuse - social work for alcohol abuse   Leukocytosis - unclear etiology - obtain CXR, UA an Urine culture   Radiological Exams on Admission: No results found.  Code Status: Full Family Communication: Pt at bedside Disposition Plan: Admit for further evaluation to stepdown unit  Manson Passey, MD  [facility] Pager [phone]  Review of Systems:  Constitutional: Negative for fever, chills and malaise/fatigue. Negative for diaphoresis.  HENT: Negative for hearing loss, ear pain, nosebleeds, congestion, sore throat, neck pain, tinnitus and ear discharge.   Eyes: Negative for blurred vision, double vision, photophobia, pain, discharge  and redness.  Respiratory: Negative for cough, hemoptysis, sputum production, shortness of breath, wheezing and stridor.   Cardiovascular: Negative for chest pain, palpitations, orthopnea, claudication and leg swelling.  Gastrointestinal: positive for nausea, vomiting, no abdominal pain. Negative for heartburn, constipation, blood in stool and melena.  Genitourinary: Negative for dysuria, urgency, frequency, hematuria and flank pain.  Musculoskeletal: Negative for myalgias, back pain, joint pain and falls.  Skin: Negative for itching and rash.  Neurological: Negative for dizziness and weakness. Negative for tingling, tremors, sensory change, speech change, focal weakness, loss of consciousness and headaches.  Endo/Heme/Allergies: Negative for environmental allergies and polydipsia. Does not bruise/bleed easily.  Psychiatric/Behavioral: Negative for suicidal ideas. The patient is not nervous/anxious.      Past Medical History  Diagnosis Date  . GERD (gastroesophageal reflux disease)   . H/O hiatal hernia     pt states he had an EGD at age 52 and had a hiatal hernia  . Dysphagia   . Diverticulitis of colon with bleeding 2010    Hospitalized in 2010 for "intestinal infection requiring IV antibiotics"  . PTSD (post-traumatic stress disorder)   . Alcohol abuse   . Hypoglycemia     recurrent  . Elevated LFTs    Past Surgical History  Procedure Laterality Date  . Tonsillectomy    . Esophagogastroduodenoscopy      age 80  . Colonoscopy  05/15/2011    Procedure: COLONOSCOPY;  Surgeon: Hilarie Fredrickson, MD;  Location: [facility] ENDOSCOPY;  Service: Endoscopy;  Laterality: N/A;  . Esophagogastroduodenoscopy  05/15/2011    Procedure: ESOPHAGOGASTRODUODENOSCOPY (EGD);  Surgeon: Hilarie Fredrickson, MD;  Location: [facility] ENDOSCOPY;  Service: Endoscopy;  Laterality: N/A;  . Givens capsule study  05/30/2011    Procedure: GIVENS CAPSULE STUDY;  Surgeon: Hilarie Fredrickson, MD;  Location: [facility] ENDOSCOPY;  Service: Endoscopy;   Laterality: N/A;  Givens Capsule study.   Social History:  reports that he has never smoked. His smokeless tobacco use includes Chew. He reports that he drinks about 1.8 ounces of alcohol per week. He reports that he does not use illicit drugs.  No Known Allergies  Family History  Problem Relation Age of Onset  . Diabetes type II Mother   . Emphysema Father   . Heart attack Father   . Stroke Father   . Diabetes type II Other   . Diabetes type II Maternal Aunt      Prior to Admission medications   Medication Sig Start Date End Date Taking? Authorizing Provider  acetaminophen (TYLENOL) 500 MG tablet Take 1,000 mg by mouth every 6 (six) hours as needed for pain.   Yes Historical Provider, MD  ranitidine (ZANTAC) 150 MG tablet Take 150 mg by mouth 2 (two) times daily as needed for heartburn.   Yes Historical Provider, MD   Physical Exam: Filed Vitals:   03/02/13 1019 03/02/13 1200 03/02/13 1252 03/02/13 1507  BP: 130/72 133/81 139/83 132/71  Pulse: 132 116 117 117  Temp: 97.8 F (36.6 C)  98.3 F (36.8 C) 99.9 F (37.7 C)  TempSrc: Oral  Oral Oral  Resp: 20  18 18   SpO2: 100%  97% 95%    Physical Exam  Constitutional: Appears well-developed and well-nourished. No distress.  HENT: Normocephalic. External right and left ear normal. Oropharynx is clear and moist.  Eyes: Conjunctivae and EOM are normal. PERRLA, no scleral icterus.  Neck: Normal ROM. Neck supple. No JVD. No tracheal deviation. No thyromegaly.  CVS: RRR, S1/S2 +, no murmurs, no gallops, no carotid bruit.  Pulmonary: Effort and breath sounds normal, no stridor, rhonchi, wheezes, rales.  Abdominal: Soft. BS +,  no distension, tenderness, rebound or guarding.  Musculoskeletal: Normal range of motion. No edema and no tenderness.  Lymphadenopathy: No lymphadenopathy noted, cervical, inguinal. Neuro: Alert. Normal reflexes, muscle tone coordination. No cranial nerve deficit. Skin: Skin is warm and dry. No rash noted.  Not diaphoretic. No erythema. No pallor.  Psychiatric: Normal mood and affect. Behavior, judgment, thought content normal.   Labs on Admission:  Basic Metabolic Panel:  Recent Labs Lab 03/02/13 1052  NA 137  K 5.0  CL 90*  CO2 12*  GLUCOSE 71  BUN 13  CREATININE 0.88  CALCIUM 9.5   Liver Function Tests:  Recent Labs Lab 03/02/13 1052  AST 110*  ALT 66*  ALKPHOS 81  BILITOT 0.7  PROT 8.0  ALBUMIN 5.2    Recent Labs Lab 03/02/13 1052  LIPASE 16   No results found for this basename: AMMONIA,  in the last 168 hours CBC:  Recent Labs Lab 03/02/13 1052  WBC 22.1*  NEUTROABS 20.1*  HGB 14.7  HCT 43.0  MCV 95.6  PLT 221   Cardiac Enzymes: No results found for this basename: CKTOTAL, CKMB, CKMBINDEX, TROPONINI,  in the last 168 hours BNP: No components found with this basename: POCBNP,  CBG:  Recent Labs Lab 03/02/13 1018  GLUCAP 60*    If 7PM-7AM, please contact night-coverage [email or web address] Password [facility] 03/02/2013, 3:46 PM

## 2013-03-03 DIAGNOSIS — K7689 Other specified diseases of liver: Secondary | ICD-10-CM

## 2013-03-03 LAB — COMPREHENSIVE METABOLIC PANEL
ALT: 39 U/L (ref 0–53)
Albumin: 3.2 g/dL — ABNORMAL LOW (ref 3.5–5.2)
Alkaline Phosphatase: 48 U/L (ref 39–117)
BUN: 10 mg/dL (ref 6–23)
Chloride: 101 mEq/L (ref 96–112)
Creatinine, Ser: 0.83 mg/dL (ref 0.50–1.35)
Potassium: 3.9 mEq/L (ref 3.5–5.1)
Sodium: 134 mEq/L — ABNORMAL LOW (ref 135–145)
Total Bilirubin: 0.9 mg/dL (ref 0.3–1.2)
Total Protein: 5.4 g/dL — ABNORMAL LOW (ref 6.0–8.3)

## 2013-03-03 LAB — CBC
HCT: 31.9 % — ABNORMAL LOW (ref 39.0–52.0)
Hemoglobin: 11.2 g/dL — ABNORMAL LOW (ref 13.0–17.0)
MCH: 32.7 pg (ref 26.0–34.0)
MCHC: 35.1 g/dL (ref 30.0–36.0)
MCV: 93 fL (ref 78.0–100.0)
Platelets: 166 10*3/uL (ref 150–400)
RDW: 12.8 % (ref 11.5–15.5)
WBC: 11.9 10*3/uL — ABNORMAL HIGH (ref 4.0–10.5)

## 2013-03-03 LAB — URINE CULTURE: Colony Count: NO GROWTH

## 2013-03-03 LAB — GLUCOSE, CAPILLARY: Glucose-Capillary: 98 mg/dL (ref 70–99)

## 2013-03-03 LAB — TSH: TSH: 0.461 u[IU]/mL (ref 0.350–4.500)

## 2013-03-03 MED ORDER — GUAIFENESIN ER 600 MG PO TB12
600.0000 mg | ORAL_TABLET | Freq: Two times a day (BID) | ORAL | Status: DC | PRN
Start: 2013-03-03 — End: 2013-03-03
  Administered 2013-03-03: 600 mg via ORAL
  Filled 2013-03-03: qty 1

## 2013-03-03 MED ORDER — FOLIC ACID 1 MG PO TABS: 1.0000 mg | ORAL_TABLET | Freq: Every day | ORAL | Status: DC

## 2013-03-03 MED ORDER — PNEUMOCOCCAL VAC POLYVALENT 25 MCG/0.5ML IJ INJ
0.5000 mL | INJECTION | INTRAMUSCULAR | Status: AC
  Administered 2013-03-03: 0.5 mL via INTRAMUSCULAR
  Filled 2013-03-03: qty 0.5

## 2013-03-03 MED ORDER — LORAZEPAM 1 MG PO TABS: 1.0000 mg | ORAL_TABLET | Freq: Two times a day (BID) | ORAL | Status: DC | PRN

## 2013-03-03 MED ORDER — PNEUMOCOCCAL VAC POLYVALENT 25 MCG/0.5ML IJ INJ: 0.5000 mL | INJECTION | INTRAMUSCULAR | Status: DC

## 2013-03-03 MED ORDER — ADULT MULTIVITAMIN W/MINERALS CH: 1.0000 | ORAL_TABLET | Freq: Every day | ORAL | Status: DC

## 2013-03-03 MED ORDER — THIAMINE HCL 100 MG PO TABS: 100.0000 mg | ORAL_TABLET | Freq: Every day | ORAL | Status: DC
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
## 2013-03-03 NOTE — Discharge Summary (Signed)
Physician Discharge Summary  Samuel [name] ZOX:[id number] DOB: 10-Apr-1980 DOA: 03/02/2013  PCP: Default, Provider, MD  Admit date: 03/02/2013 Discharge date: 03/03/2013  Recommendations for Outpatient Follow-up:  1. Continue folic acid multivitamin and thiamin as prescribed 2. Follow up with Dr. Elisabeth Pigeon at [facility], call [phone] to schedule an appointment 3. Seek help for alcohol abstinence as we have discussed   Discharge Diagnoses:  Principal Problem:   Alcoholic ketoacidosis Active Problems:   Acute alcohol intoxication   GERD (gastroesophageal reflux disease)   Alcohol abuse   Leukocytosis    Discharge Condition: medically stable for discharge   Diet recommendation: as tolerated   History of present illness:  32 year old male with past medical history of alcohol abuse, GERD who presented to [facility] ED 03/02/2013 with intractable nausea and vomiting. His last alcoholic beverage was 1 day prior to this admission, drinking beer.   In ED, pt was found to have alcohol level of 98. BP was 105/61, HR 110, T max 99.9 F, O2 saturation was 95% on room air. CIWA protocol was initiated. CBC revealed WBC count of 22.1. BMP revealed CO2 of 12 and AG of 35. CXR did not reveal acute cardiopulmonary process. Urinalysis was unremarkable.   Assessment and Plan:   Principal Problem:  Alcoholic ketoacidosis  - admitted to stepdown unit  - CIWA initiated on admission - continued IV fluids, thiamine, multivitamin and folic acid  - ketoacidosis resolved  - UDS negative  Active Problems:  Acute alcohol intoxication  - alcohol level is 98 on the admission  - not tremulous this am - pt prefers to obtain help on his own and does not want to be placed in rehab GERD (gastroesophageal reflux disease)  - continue protonix  Alcohol abuse  - social work for alcohol abuse  Leukocytosis  - unclear etiology  - CXR WNL, UA negative    Code Status: Full  Family  Communication: Pt at bedside    Signed:  Manson Passey, MD  [facility] 03/03/2013, 9:59 AM  Pager #: [phone]  Procedures:  None   Consultations:  None   Discharge Exam: Filed Vitals:   03/03/13 0800  BP: 104/67  Pulse: 81  Temp: 98 F (36.7 C)  Resp: 18   Filed Vitals:   03/03/13 0400 03/03/13 0423 03/03/13 0600 03/03/13 0800  BP: 94/57  99/66 104/67  Pulse: 79  81 81  Temp:  97.8 F (36.6 C)  98 F (36.7 C)  TempSrc:  Oral  Oral  Resp: 10   18  Height:      Weight:  50.9 kg (112 lb 3.4 oz)    SpO2: 98%   98%    General: Pt is alert, follows commands appropriately, not in acute distress Cardiovascular: Regular rate and rhythm, S1/S2 +, no murmurs, no rubs, no gallops Respiratory: Clear to auscultation bilaterally, no wheezing, no crackles, no rhonchi Abdominal: Soft, non tender, non distended, bowel sounds +, no guarding Extremities: no edema, no cyanosis, pulses palpable bilaterally DP and PT Neuro: Grossly nonfocal  Discharge Instructions  Discharge Orders   Future Orders Complete By Expires   Call MD for:  difficulty breathing, headache or visual disturbances  As directed    Call MD for:  persistant dizziness or light-headedness  As directed    Call MD for:  persistant nausea and vomiting  As directed    Call MD for:  severe uncontrolled pain  As directed    Diet - low  sodium heart healthy  As directed    Discharge instructions  As directed    Comments:     1. Continue folic acid multivitamin and thiamin as prescribed 2. Follow up with Dr. Elisabeth Pigeon at [facility], call [phone] to schedule an appointment 3. Seek help for alcohol abstinence as we have discussed   Increase activity slowly  As directed        Medication List    STOP taking these medications       acetaminophen 500 MG tablet  Commonly known as:  TYLENOL      TAKE these medications       folic acid 1 MG tablet  Commonly known as:  FOLVITE   Take 1 tablet (1 mg total) by mouth daily.     LORazepam 1 MG tablet  Commonly known as:  ATIVAN  Take 1 tablet (1 mg total) by mouth every 12 (twelve) hours as needed for anxiety (CIWA-AR > 8-OR-withdrawal symptoms:anxiety, agitation, insomnia, diaphoresis, nausea, vomiting, tremors, tachycardia, or hypertension.).     multivitamin with minerals Tabs tablet  Take 1 tablet by mouth daily.     ranitidine 150 MG tablet  Commonly known as:  ZANTAC  Take 150 mg by mouth 2 (two) times daily as needed for heartburn.     thiamine 100 MG tablet  Take 1 tablet (100 mg total) by mouth daily.           Follow-up Information   Follow up with [facility]    . Schedule an appointment as soon as possible for a visit in 2 weeks. (sch appt with Dr. Elisabeth Pigeon)    Contact information:   [address] [place] [address] [phone]       The results of significant diagnostics from this hospitalization (including imaging, microbiology, ancillary and laboratory) are listed below for reference.    Significant Diagnostic Studies: Dg Chest Port 1 View  03/02/2013   CLINICAL DATA:  Nausea and vomiting.  Sore throat.  EXAM: PORTABLE CHEST - 1 VIEW  COMPARISON:  Two-view examination 12/09/2012 [facility] and 04/13/2012 [facility].  FINDINGS: Cardiomediastinal silhouette unremarkable and unchanged. Lungs clear. Bronchovascular markings normal. Pulmonary vascularity normal. No visible pleural effusions. No pneumothorax.  IMPRESSION: No acute cardiopulmonary disease.   Electronically Signed   By: Hulan Saas M.D.   On: 03/02/2013 16:16    Microbiology: MRSA PCR SCREENING     Status: None   Collection Time    03/02/13  4:50 PM      Result Value Range Status   MRSA by PCR NEGATIVE  NEGATIVE Final     Labs: Basic Metabolic Panel:  Recent Labs Lab 03/02/13 1052 03/02/13 1720 03/03/13 0400  NA 137 132* 134*  K 5.0 4.9 3.9  CL  90* 95* 101  CO2 12* 17* 25  GLUCOSE 71 151* 97  BUN 13 11 10   CREATININE 0.88 0.83 0.83  CALCIUM 9.5 8.5 8.2*  MG  --  1.8  --   PHOS  --  3.0  --    Liver Function Tests:  Recent Labs Lab 03/02/13 1052 03/02/13 1720 03/03/13 0400  AST 110* 85* 63*  ALT 66* 50 39  ALKPHOS 81 58 48  BILITOT 0.7 0.6 0.9  PROT 8.0 6.3 5.4*  ALBUMIN 5.2 3.7 3.2*    Recent Labs Lab 03/02/13 1052  LIPASE 16   No results found for this basename: AMMONIA,  in the  last 168 hours CBC:  Recent Labs Lab 03/02/13 1052 03/02/13 1720 03/03/13 0400  WBC 22.1* 16.9* 11.9*  NEUTROABS 20.1* 15.0*  --   HGB 14.7 12.1* 11.2*  HCT 43.0 34.5* 31.9*  MCV 95.6 94.0 93.0  PLT 221 176 166   Cardiac Enzymes: No results found for this basename: CKTOTAL, CKMB, CKMBINDEX, TROPONINI,  in the last 168 hours BNP: BNP (last 3 results) No results found for this basename: PROBNP,  in the last 8760 hours CBG:  Recent Labs Lab 03/02/13 1018 03/02/13 1647 03/03/13 0744  GLUCAP 60* 154* 98    Time coordinating discharge: Over 30 minutes

## 2013-03-03 NOTE — Progress Notes (Signed)
Samuel [name] 1232 D/C INSTRUCTIONS GIVEN WITH PRESCRIPTIONS. TELE AND IV D/C'D. NO CHANGE IN HIS CONDITION. WILL BE D/C'D HOME AFTER SOCIAL WORKER SEES HIM.

## 2013-04-09 ENCOUNTER — Ambulatory Visit: Payer: Self-pay | Admitting: Internal Medicine

## 2013-05-22 ENCOUNTER — Encounter (HOSPITAL_COMMUNITY): Payer: Self-pay | Admitting: Emergency Medicine

## 2013-05-22 ENCOUNTER — Emergency Department (HOSPITAL_COMMUNITY)
Admission: EM | Admit: 2013-05-22 | Discharge: 2013-05-22 | Disposition: A | Discharge status: Home / Self Care | Payer: Self-pay | Attending: Emergency Medicine | Admitting: Emergency Medicine

## 2013-05-22 DIAGNOSIS — Z8639 Personal history of other endocrine, nutritional and metabolic disease: Secondary | ICD-10-CM | POA: Insufficient documentation

## 2013-05-22 DIAGNOSIS — F1994 Other psychoactive substance use, unspecified with psychoactive substance-induced mood disorder: Secondary | ICD-10-CM | POA: Diagnosis present

## 2013-05-22 DIAGNOSIS — F32A Depression, unspecified: Secondary | ICD-10-CM

## 2013-05-22 DIAGNOSIS — F1010 Alcohol abuse, uncomplicated: Secondary | ICD-10-CM

## 2013-05-22 DIAGNOSIS — R45851 Suicidal ideations: Secondary | ICD-10-CM | POA: Insufficient documentation

## 2013-05-22 DIAGNOSIS — D72829 Elevated white blood cell count, unspecified: Secondary | ICD-10-CM

## 2013-05-22 DIAGNOSIS — Z862 Personal history of diseases of the blood and blood-forming organs and certain disorders involving the immune mechanism: Secondary | ICD-10-CM | POA: Insufficient documentation

## 2013-05-22 DIAGNOSIS — F3289 Other specified depressive episodes: Secondary | ICD-10-CM | POA: Insufficient documentation

## 2013-05-22 DIAGNOSIS — K760 Fatty (change of) liver, not elsewhere classified: Secondary | ICD-10-CM

## 2013-05-22 DIAGNOSIS — E872 Acidosis: Secondary | ICD-10-CM

## 2013-05-22 DIAGNOSIS — E8729 Other acidosis: Secondary | ICD-10-CM

## 2013-05-22 DIAGNOSIS — F329 Major depressive disorder, single episode, unspecified: Secondary | ICD-10-CM

## 2013-05-22 DIAGNOSIS — K219 Gastro-esophageal reflux disease without esophagitis: Secondary | ICD-10-CM | POA: Insufficient documentation

## 2013-05-22 DIAGNOSIS — F1510 Other stimulant abuse, uncomplicated: Secondary | ICD-10-CM | POA: Insufficient documentation

## 2013-05-22 DIAGNOSIS — D126 Benign neoplasm of colon, unspecified: Secondary | ICD-10-CM

## 2013-05-22 DIAGNOSIS — R42 Dizziness and giddiness: Secondary | ICD-10-CM | POA: Insufficient documentation

## 2013-05-22 DIAGNOSIS — F10929 Alcohol use, unspecified with intoxication, unspecified: Secondary | ICD-10-CM

## 2013-05-22 LAB — COMPREHENSIVE METABOLIC PANEL
ALBUMIN: 5.1 g/dL (ref 3.5–5.2)
ALT: 14 U/L (ref 0–53)
AST: 31 U/L (ref 0–37)
Alkaline Phosphatase: 75 U/L (ref 39–117)
BUN: 13 mg/dL (ref 6–23)
CO2: 20 mEq/L (ref 19–32)
CREATININE: 0.78 mg/dL (ref 0.50–1.35)
Calcium: 9.7 mg/dL (ref 8.4–10.5)
Chloride: 91 mEq/L — ABNORMAL LOW (ref 96–112)
GFR calc Af Amer: >90 mL/min (ref >90)
Glucose, Bld: 65 mg/dL — ABNORMAL LOW (ref 70–99)
Potassium: 4.7 mEq/L (ref 3.7–5.3)
Sodium: 140 mEq/L (ref 137–147)
Total Bilirubin: 0.5 mg/dL (ref 0.3–1.2)
Total Protein: 8 g/dL (ref 6.0–8.3)

## 2013-05-22 LAB — CBG MONITORING, ED: Glucose-Capillary: 62 mg/dL — ABNORMAL LOW (ref 70–99)

## 2013-05-22 LAB — CBC
HCT: 40.6 % (ref 39.0–52.0)
Hemoglobin: 14.3 g/dL (ref 13.0–17.0)
MCH: 32.1 pg (ref 26.0–34.0)
MCHC: 35.2 g/dL (ref 30.0–36.0)
MCV: 91.2 fL (ref 78.0–100.0)
Platelets: 263 10*3/uL (ref 150–400)
RBC: 4.45 MIL/uL (ref 4.22–5.81)
RDW: 12.6 % (ref 11.5–15.5)
WBC: 9.5 10*3/uL (ref 4.0–10.5)

## 2013-05-22 LAB — ACETAMINOPHEN LEVEL: Acetaminophen (Tylenol), Serum: <15 ug/mL (ref 10–30)

## 2013-05-22 LAB — SALICYLATE LEVEL

## 2013-05-22 LAB — ETHANOL: ALCOHOL ETHYL (B): 235 mg/dL — AB (ref 0–11)

## 2013-05-22 LAB — RAPID URINE DRUG SCREEN, HOSP PERFORMED
AMPHETAMINES: POSITIVE — AB
Barbiturates: NOT DETECTED
Benzodiazepines: NOT DETECTED
Cocaine: NOT DETECTED
OPIATES: NOT DETECTED
Tetrahydrocannabinol: NOT DETECTED

## 2013-05-22 MED ORDER — NICOTINE 21 MG/24HR TD PT24: 21.0000 mg | MEDICATED_PATCH | Freq: Every day | TRANSDERMAL | Status: DC | PRN

## 2013-05-22 MED ORDER — ONDANSETRON HCL 4 MG PO TABS: 4.0000 mg | ORAL_TABLET | Freq: Three times a day (TID) | ORAL | Status: DC | PRN

## 2013-05-22 MED ORDER — VITAMIN B-1 100 MG PO TABS
100.0000 mg | ORAL_TABLET | Freq: Once | ORAL | Status: AC
  Administered 2013-05-22: 14:00:00 100 mg via ORAL
  Filled 2013-05-22: qty 1

## 2013-05-22 MED ORDER — PANTOPRAZOLE SODIUM 40 MG PO TBEC
40.0000 mg | DELAYED_RELEASE_TABLET | Freq: Every day | ORAL | Status: DC
  Administered 2013-05-22: 40 mg via ORAL
  Filled 2013-05-22: qty 1

## 2013-05-22 MED ORDER — ONDANSETRON 4 MG PO TBDP
4.0000 mg | ORAL_TABLET | Freq: Once | ORAL | Status: AC
  Administered 2013-05-22: 4 mg via ORAL
  Filled 2013-05-22: qty 1

## 2013-05-22 MED ORDER — ZOLPIDEM TARTRATE 5 MG PO TABS: 5.0000 mg | ORAL_TABLET | Freq: Every evening | ORAL | Status: DC | PRN

## 2013-05-22 MED ORDER — ALUM & MAG HYDROXIDE-SIMETH 200-200-20 MG/5ML PO SUSP: 30.0000 mL | ORAL | Status: DC | PRN

## 2013-05-22 MED ORDER — FOLIC ACID 1 MG PO TABS
1.0000 mg | ORAL_TABLET | Freq: Once | ORAL | Status: AC
  Administered 2013-05-22: 1 mg via ORAL
  Filled 2013-05-22: qty 1

## 2013-05-22 MED ORDER — LORAZEPAM 1 MG PO TABS
1.0000 mg | ORAL_TABLET | Freq: Three times a day (TID) | ORAL | Status: DC | PRN
  Administered 2013-05-22: 1 mg via ORAL
  Filled 2013-05-22: qty 1

## 2013-05-22 MED ORDER — ACETAMINOPHEN 325 MG PO TABS: 650.0000 mg | ORAL_TABLET | ORAL | Status: DC | PRN

## 2013-05-22 MED ORDER — IBUPROFEN 200 MG PO TABS: 600.0000 mg | ORAL_TABLET | Freq: Three times a day (TID) | ORAL | Status: DC | PRN
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
## 2013-05-22 NOTE — ED Provider Notes (Addendum)
CSN: [id number]     Arrival date & time 05/22/13  1214 History   First MD Initiated Contact with Patient 05/22/13 1256     Chief Complaint  Patient presents with  . Medical Clearance  . Dizziness     (Consider location/radiation/quality/duration/timing/severity/associated sxs/prior Treatment) Patient is a 33 y.o. male presenting with dizziness and mental health disorder. The history is provided by the patient.  Dizziness Associated symptoms: nausea   Associated symptoms: no shortness of breath   Mental Health Problem Presenting symptoms: depression and suicidal thoughts   Presenting symptoms: no suicide attempt   Patient accompanied by:  Spouse Degree of incapacity (severity):  Moderate Onset quality:  Gradual Timing:  Constant Progression:  Worsening Chronicity:  New Context: alcohol use and stressful life event   Relieved by:  Nothing Worsened by:  Nothing tried Ineffective treatments:  None tried Associated symptoms: no abdominal pain     Past Medical History  Diagnosis Date  . GERD (gastroesophageal reflux disease)   . H/O hiatal hernia     pt states he had an EGD at age 33 and had a hiatal hernia  . Dysphagia   . Diverticulitis of colon with bleeding 2010    Hospitalized in 2010 for "intestinal infection requiring IV antibiotics"  . PTSD (post-traumatic stress disorder)   . Alcohol abuse   . Hypoglycemia     recurrent  . Elevated LFTs    Past Surgical History  Procedure Laterality Date  . Tonsillectomy    . Esophagogastroduodenoscopy      age 67  . Colonoscopy  05/15/2011    Procedure: COLONOSCOPY;  Surgeon: Irene Shipper, MD;  Location: [place];  Service: Endoscopy;  Laterality: N/A;  . Esophagogastroduodenoscopy  05/15/2011    Procedure: ESOPHAGOGASTRODUODENOSCOPY (EGD);  Surgeon: Irene Shipper, MD;  Location: [facility] ENDOSCOPY;  Service: Endoscopy;  Laterality: N/A;  . Givens capsule study  05/30/2011    Procedure: GIVENS CAPSULE STUDY;  Surgeon: Irene Shipper, MD;  Location: [place];  Service: Endoscopy;  Laterality: N/A;  Givens Capsule study.   Family History  Problem Relation Age of Onset  . Diabetes type II Mother   . Emphysema Father   . Heart attack Father   . Stroke Father   . Diabetes type II Other   . Diabetes type II Maternal Aunt    History  Substance Use Topics  . Smoking status: Never Smoker   . Smokeless tobacco: Current User    Types: Chew     Comment: States he has been chewing tobacco almost daily since age 75  . Alcohol Use: 1.8 oz/week    3 Cans of beer per week     Comment: average six 12oz beers/day, ranges from 3-12 12oz/day    Review of Systems  Constitutional: Negative for fever.  Respiratory: Negative for cough and shortness of breath.   Gastrointestinal: Positive for nausea. Negative for abdominal pain.  Neurological: Positive for dizziness.  Psychiatric/Behavioral: Positive for suicidal ideas.  All other systems reviewed and are negative.      Allergies  Review of patient's allergies indicates no known allergies.  Home Medications   Current Outpatient Rx  Name  Route  Sig  Dispense  Refill  . ranitidine (ZANTAC) 150 MG tablet   Oral   Take 150 mg by mouth 2 (two) times daily as needed for heartburn.          BP 143/76  Pulse 86  Temp(Src) 97.6 F (36.4 C) (Oral)  Resp 16  SpO2 99% Physical Exam  Nursing note and vitals reviewed. Constitutional: He is oriented to person, place, and time. He appears well-developed and well-nourished. No distress.  HENT:  Head: Normocephalic and atraumatic.  Mouth/Throat: No oropharyngeal exudate.  Eyes: EOM are normal. Pupils are equal, round, and reactive to light.  Neck: Normal range of motion. Neck supple.  Cardiovascular: Normal rate and regular rhythm.  Exam reveals no friction rub.   No murmur heard. Pulmonary/Chest: Effort normal and breath sounds normal. No respiratory distress. He has no wheezes. He has no rales.  Abdominal: He  exhibits no distension. There is no tenderness. There is no rebound.  Musculoskeletal: Normal range of motion. He exhibits no edema.  Neurological: He is alert and oriented to person, place, and time. No cranial nerve deficit. He exhibits normal muscle tone. Coordination normal.  Skin: No rash noted. He is not diaphoretic.  Psychiatric: His behavior is normal. Judgment and thought content normal.    ED Course  Procedures (including critical care time) Labs Review Labs Reviewed  COMPREHENSIVE METABOLIC PANEL - Abnormal; Notable for the following:    Chloride 91 (*)    Glucose, Bld 65 (*)    All other components within normal limits  ETHANOL - Abnormal; Notable for the following:    Alcohol, Ethyl (B) 235 (*)    All other components within normal limits  URINE RAPID DRUG SCREEN (HOSP PERFORMED) - Abnormal; Notable for the following:    Amphetamines POSITIVE (*)    All other components within normal limits  SALICYLATE LEVEL - Abnormal; Notable for the following:    Salicylate Lvl <4.2 (*)    All other components within normal limits  CBG MONITORING, ED - Abnormal; Notable for the following:    Glucose-Capillary 62 (*)    All other components within normal limits  CBC  ACETAMINOPHEN LEVEL  CBG MONITORING, ED   Imaging Review No results found.   EKG Interpretation None      MDM   Final diagnoses:  Depression  Suicidal ideation    80M presents with depression. Is using alcohol. He is depressed - had multiple friends in the [place] die. Also had a friend call him and then commit suicide. Patient not sleeping. No prior history of psychiatric illness. Vague SI - doesn't want to wake up. No plan. TTS consulted. Labs with low glucose, patient eating.     Osvaldo Shipper, MD 05/22/13 [place], MD 05/22/13 [phone]
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
## 2013-05-22 NOTE — ED Notes (Addendum)
Pt. Family took belongings home. Pt. Wanted to keep wallet with him but he stated that there was nothing in there to be lock up with security.

## 2013-05-22 NOTE — Progress Notes (Signed)
[facility] CL spoke with patient about [facility]. Patient stated that he was not a resident of [place]. Provided pt with a list of primary care resources, highlighting [facility] [place].

## 2013-05-22 NOTE — Discharge Instructions (Signed)
Finding Treatment for Alcohol and Drug Addiction It can be hard to find the right place to get professional treatment. Here are some important things to consider:  There are different types of treatment to choose from.  Some programs are live-in (residential) while others are not (outpatient). Sometimes a combination is offered.  No single type of program is right for everyone.  Most treatment programs involve a combination of education, counseling, and a 12-step, spiritually-based approach.  There are non-spiritually based programs (not 12-step).  Some treatment programs are government sponsored. They are geared for patients without private insurance.  Treatment programs can vary in many respects such as:  Cost and types of insurance accepted.  Types of on-site medical services offered.  Length of stay, setting, and size.  Overall philosophy of treatment. A person may need specialized treatment or have needs not addressed by all programs. For example, adolescents need treatment appropriate for their age. Other people have secondary disorders that must be managed as well. Secondary conditions can include mental illness, such as depression or diabetes. Often, a period of detoxification from alcohol or drugs is needed. This requires medical supervision and not all programs offer this. THINGS TO CONSIDER WHEN SELECTING A TREATMENT PROGRAM   Is the program certified by the appropriate government agency? Even private programs must be certified and employ certified professionals.  Does the program accept your insurance? If not, can a payment plan be set up?  Is the facility clean, organized, and well run? Do they allow you to speak with graduates who can share their treatment experience with you? Can you tour the facility? Can you meet with staff?  Does the program meet the full range of individual needs?  Does the treatment program address sexual orientation and physical disabilities?  Do they provide age, gender, and culturally appropriate treatment services?  Is treatment available in languages other than English?  Is long-term aftercare support or guidance encouraged and provided?  Is assessment of an individual's treatment plan ongoing to ensure it meets changing needs?  Does the program use strategies to encourage reluctant patients to remain in treatment long enough to increase the likelihood of success?  Does the program offer counseling (individual or group) and other behavioral therapies?  Does the program offer medicine as part of the treatment regimen, if needed?  Is there ongoing monitoring of possible relapse? Is there a defined relapse prevention program? Are services or referrals offered to family members to ensure they understand addiction and the recovery process? This would help them support the recovering individual.  Are 12-step meetings held at the center or is transport available for patients to attend outside meetings? In countries outside of the [place] and [place], [employer] for contact information for services in your area. Document Released: 01/20/2005 Document Revised: 05/16/2011 Document Reviewed: 08/02/2007 [facility] Patient Information 2014 [place].  Alcohol Problems Most adults who drink alcohol drink in moderation (not a lot) are at low risk for developing problems related to their drinking. However, all drinkers, including low-risk drinkers, should know about the health risks connected with drinking alcohol. RECOMMENDATIONS FOR LOW-RISK DRINKING  Drink in moderation. Moderate drinking is defined as follows:   Men - no more than 2 drinks per day.  Nonpregnant women - no more than 1 drink per day.  Over age 24 - no more than 1 drink per day. A standard drink is 12 grams of pure alcohol, which is equal to a 12 ounce bottle of beer or  wine cooler, a 5 ounce glass of wine, or 1.5 ounces of distilled spirits (such as  whiskey, brandy, vodka, or rum).  ABSTAIN FROM (DO NOT DRINK) ALCOHOL:  When pregnant or considering pregnancy.  When taking a medication that interacts with alcohol.  If you are alcohol dependent.  A medical condition that prohibits drinking alcohol (such as ulcer, liver disease, or heart disease). DISCUSS WITH YOUR CAREGIVER:  If you are at risk for coronary heart disease, discuss the potential benefits and risks of alcohol use: Light to moderate drinking is associated with lower rates of coronary heart disease in certain populations (for example, men over age 59 and postmenopausal women). Infrequent or nondrinkers are advised not to begin light to moderate drinking to reduce the risk of coronary heart disease so as to avoid creating an alcohol-related problem. Similar protective effects can likely be gained through proper diet and exercise.  Women and the elderly have smaller amounts of body water than men. As a result women and the elderly achieve a higher blood alcohol concentration after drinking the same amount of alcohol.  Exposing a fetus to alcohol can cause a broad range of birth defects referred to as Fetal Alcohol Syndrome (FAS) or Alcohol-Related Birth Defects (ARBD). Although FAS/ARBD is connected with excessive alcohol consumption during pregnancy, studies also have reported neurobehavioral problems in infants born to mothers reporting drinking an average of 1 drink per day during pregnancy.  Heavier drinking (the consumption of more than 4 drinks per occasion by men and more than 3 drinks per occasion by women) impairs learning (cognitive) and psychomotor functions and increases the risk of alcohol-related problems, including accidents and injuries. CAGE QUESTIONS:   Have you ever felt that you should Cut down on your drinking?  Have people Annoyed you by criticizing your drinking?  Have you ever felt bad or Guilty about your drinking?  Have you ever had a drink first  thing in the morning to steady your nerves or get rid of a hangover (Eye opener)? If you answered positively to any of these questions: You may be at risk for alcohol-related problems if alcohol consumption is:   Men: Greater than 14 drinks per week or more than 4 drinks per occasion.  Women: Greater than 7 drinks per week or more than 3 drinks per occasion. Do you or your family have a medical history of alcohol-related problems, such as:  Blackouts.  Sexual dysfunction.  Depression.  Trauma.  Liver dysfunction.  Sleep disorders.  Hypertension.  Chronic abdominal pain.  Has your drinking ever caused you problems, such as problems with your family, problems with your work (or school) performance, or accidents/injuries?  Do you have a compulsion to drink or a preoccupation with drinking?  Do you have poor control or are you unable to stop drinking once you have started?  Do you have to drink to avoid withdrawal symptoms?  Do you have problems with withdrawal such as tremors, nausea, sweats, or mood disturbances?  Does it take more alcohol than in the past to get you high?  Do you feel a strong urge to drink?  Do you change your plans so that you can have a drink?  Do you ever drink in the morning to relieve the shakes or a hangover? If you have answered a number of the previous questions positively, it may be time for you to talk to your caregivers, family, and friends and see if they think you have a problem. Alcoholism is a Banker  dependency that keeps getting worse and will eventually destroy your health and relationships. Many alcoholics end up dead, impoverished, or in prison. This is often the end result of all chemical dependency.  Do not be discouraged if you are not ready to take action immediately.  Decisions to change behavior often involve up and down desires to change and feeling like you cannot decide.  Try to think more seriously about your drinking  behavior.  Think of the reasons to quit. WHERE TO GO FOR ADDITIONAL INFORMATION   The [place] on Alcohol Abuse and Alcoholism (NIAAA) http://www.bradshaw.com/  [employer] on Alcoholism and Drug Dependence (NCADD) www.ncadd.[place] (ASAM) http://carpenter.net/  Document Released: 02/21/2005 Document Revised: 05/16/2011 Document Reviewed: 10/10/2007 [facility] Patient Information 2014 [place].  How Much is Too Much Alcohol? Drinking too much alcohol can cause injury, accidents, and health problems. These types of problems can include:   Car crashes.  Falls.  Family fighting (domestic violence).  Drowning.  Fights.  Injuries.  Burns.  Damage to certain organs.  Having a baby with birth defects. ONE DRINK CAN BE TOO MUCH WHEN YOU ARE:  Working.  Pregnant or breastfeeding.  Taking medicines. Ask your doctor.  Driving or planning to drive. WHAT IS A STANDARD DRINK?   1 regular beer (12 ounces or 360 milliliters).  1 glass of wine (5 ounces or 150 milliliters).  1 shot of liquor (1.5 ounces or 45 milliliters). BLOOD ALCOHOL LEVELS   .00 A person is sober.  Marland Kitchen03 A person has no trouble keeping balance, talking, or seeing right, but a "buzz" may be felt.  Marland Kitchen05 A person feels "buzzed" and relaxed.  Marland Kitchen08 or .10  A person is drunk. He or she has trouble talking, seeing right, and keeping his or her balance.  .15 A person loses body control and may pass out (blackout).  .20 A person has trouble walking (staggering) and throws up (vomits).  .30 A person will pass out (unconscious).  .40+ A person will be in a coma. Death is possible. If you or someone you know has a drinking problem, get help from a doctor.  Document Released: 12/18/2008 Document Revised: 05/16/2011 Document Reviewed: 12/18/2008 [facility] Patient Information 2014 [place].  Substance Abuse Your exam indicates that you have a problem with  substance abuse. Substance abuse is the misuse of alcohol or drugs that causes problems in family life, friendships, and work relationships. Substance abuse is the most important cause of premature illness, disability, and death in our society. It is also the greatest threat to a person's mental and spiritual well being. Substance abuse can start out in an innocent way, such as social drinking or taking a little extra medication prescribed by your doctor. No one starts out with the intention of becoming an alcoholic or an addict. Substance abuse victims cannot control their use of alcohol or drugs. They may become intoxicated daily or go on weekend binges. Often there is a strong desire to quit, but attempts to stop using often fail. Encounters with law enforcement or conflicts with family members, friends, and work associates are signs of a potential problem. Recovery is always possible, although the craving for some drugs makes it difficult to quit without assistance. Many treatment programs are available to help people stop abusing alcohol or drugs. The first step in treatment is to admit you have a problem. This is a major hurdle because denial is a powerful force with substance abuse. Alcoholics  Anonymous, Narcotics Anonymous, Cocaine Anonymous, and other recovery groups and programs can be very useful in helping people to quit. If you do not feel okay about your drug or alcohol use and if it is causing you trouble, we want to encourage you to talk about it with your doctor or with someone from a recovery group who can help you. You could also call the [employer] on Drug Abuse at 1-800-662-HELP. It is up to you to take the first step. AL-ANON and ALA-TEEN are support groups for friends and family members of an alcohol or drug dependent person. The people who love and care for the alcoholic or addicted person often need help, too. For information about these organizations, check your phone directory  or call a local alcohol or drug treatment center. Document Released: 03/31/2004 Document Revised: 05/16/2011 Document Reviewed: 02/23/2008 [facility] Patient Information 2014 [place].

## 2013-05-22 NOTE — Consult Note (Signed)
Face to face evaluation and I agree with this note 

## 2013-08-26 ENCOUNTER — Encounter (HOSPITAL_COMMUNITY): Payer: Self-pay | Admitting: Emergency Medicine

## 2013-08-26 ENCOUNTER — Inpatient Hospital Stay (HOSPITAL_COMMUNITY)
Admission: EM | Admit: 2013-08-26 | Discharge: 2013-08-27 | DRG: 641 | Disposition: A | Discharge status: Home / Self Care | Payer: Self-pay | Attending: Internal Medicine | Admitting: Internal Medicine

## 2013-08-26 DIAGNOSIS — F1020 Alcohol dependence, uncomplicated: Secondary | ICD-10-CM | POA: Diagnosis present

## 2013-08-26 DIAGNOSIS — E46 Unspecified protein-calorie malnutrition: Secondary | ICD-10-CM | POA: Diagnosis present

## 2013-08-26 DIAGNOSIS — Z833 Family history of diabetes mellitus: Secondary | ICD-10-CM

## 2013-08-26 DIAGNOSIS — K760 Fatty (change of) liver, not elsewhere classified: Secondary | ICD-10-CM

## 2013-08-26 DIAGNOSIS — E872 Acidosis, unspecified: Principal | ICD-10-CM

## 2013-08-26 DIAGNOSIS — F10920 Alcohol use, unspecified with intoxication, uncomplicated: Secondary | ICD-10-CM

## 2013-08-26 DIAGNOSIS — F4310 Post-traumatic stress disorder, unspecified: Secondary | ICD-10-CM | POA: Diagnosis present

## 2013-08-26 DIAGNOSIS — Z79899 Other long term (current) drug therapy: Secondary | ICD-10-CM

## 2013-08-26 DIAGNOSIS — K219 Gastro-esophageal reflux disease without esophagitis: Secondary | ICD-10-CM | POA: Diagnosis present

## 2013-08-26 DIAGNOSIS — F1010 Alcohol abuse, uncomplicated: Secondary | ICD-10-CM | POA: Diagnosis present

## 2013-08-26 DIAGNOSIS — D126 Benign neoplasm of colon, unspecified: Secondary | ICD-10-CM

## 2013-08-26 DIAGNOSIS — D72829 Elevated white blood cell count, unspecified: Secondary | ICD-10-CM

## 2013-08-26 DIAGNOSIS — E8729 Other acidosis: Secondary | ICD-10-CM | POA: Diagnosis present

## 2013-08-26 DIAGNOSIS — R7402 Elevation of levels of lactic acid dehydrogenase (LDH): Secondary | ICD-10-CM | POA: Diagnosis present

## 2013-08-26 DIAGNOSIS — E162 Hypoglycemia, unspecified: Secondary | ICD-10-CM

## 2013-08-26 DIAGNOSIS — R7401 Elevation of levels of liver transaminase levels: Secondary | ICD-10-CM

## 2013-08-26 DIAGNOSIS — F1994 Other psychoactive substance use, unspecified with psychoactive substance-induced mood disorder: Secondary | ICD-10-CM

## 2013-08-26 DIAGNOSIS — Z681 Body mass index (BMI) 19 or less, adult: Secondary | ICD-10-CM

## 2013-08-26 DIAGNOSIS — R740 Nonspecific elevation of levels of transaminase and lactic acid dehydrogenase [LDH]: Secondary | ICD-10-CM

## 2013-08-26 LAB — CBC WITH DIFFERENTIAL/PLATELET
BASOS ABS: 0 10*3/uL (ref 0.0–0.1)
Basophils Relative: 0 % (ref 0–1)
EOS ABS: 0 10*3/uL (ref 0.0–0.7)
EOS PCT: 0 % (ref 0–5)
HEMATOCRIT: 41.3 % (ref 39.0–52.0)
Hemoglobin: 14.1 g/dL (ref 13.0–17.0)
Lymphocytes Relative: 3 % — ABNORMAL LOW (ref 12–46)
Lymphs Abs: 0.5 10*3/uL — ABNORMAL LOW (ref 0.7–4.0)
MCH: 32 pg (ref 26.0–34.0)
MCHC: 34.1 g/dL (ref 30.0–36.0)
MCV: 93.7 fL (ref 78.0–100.0)
Monocytes Absolute: 0.8 10*3/uL (ref 0.1–1.0)
Monocytes Relative: 5 % (ref 3–12)
Neutro Abs: 13.5 10*3/uL — ABNORMAL HIGH (ref 1.7–7.7)
Neutrophils Relative %: 92 % — ABNORMAL HIGH (ref 43–77)
Platelets: 139 10*3/uL — ABNORMAL LOW (ref 150–400)
RBC: 4.41 MIL/uL (ref 4.22–5.81)
RDW: 13.4 % (ref 11.5–15.5)
WBC: 14.8 10*3/uL — ABNORMAL HIGH (ref 4.0–10.5)

## 2013-08-26 LAB — COMPREHENSIVE METABOLIC PANEL
ALT: 88 U/L — ABNORMAL HIGH (ref 0–53)
AST: 137 U/L — AB (ref 0–37)
Albumin: 4.4 g/dL (ref 3.5–5.2)
Alkaline Phosphatase: 60 U/L (ref 39–117)
BUN: 11 mg/dL (ref 6–23)
CALCIUM: 8.6 mg/dL (ref 8.4–10.5)
CO2: 15 mEq/L — ABNORMAL LOW (ref 19–32)
CREATININE: 0.74 mg/dL (ref 0.50–1.35)
Chloride: 95 mEq/L — ABNORMAL LOW (ref 96–112)
GFR calc Af Amer: >90 mL/min (ref >90)
GFR calc non Af Amer: >90 mL/min (ref >90)
Glucose, Bld: 102 mg/dL — ABNORMAL HIGH (ref 70–99)
Potassium: 4.7 mEq/L (ref 3.7–5.3)
Sodium: 140 mEq/L (ref 137–147)
Total Bilirubin: 0.7 mg/dL (ref 0.3–1.2)
Total Protein: 7.1 g/dL (ref 6.0–8.3)

## 2013-08-26 LAB — URINALYSIS, ROUTINE W REFLEX MICROSCOPIC
Bilirubin Urine: NEGATIVE
GLUCOSE, UA: 100 mg/dL — AB
Ketones, ur: >80 mg/dL — AB
Leukocytes, UA: NEGATIVE
Nitrite: NEGATIVE
Protein, ur: NEGATIVE mg/dL
SPECIFIC GRAVITY, URINE: 1.011 (ref 1.005–1.030)
UROBILINOGEN UA: 0.2 mg/dL (ref 0.0–1.0)
pH: 5.5 (ref 5.0–8.0)

## 2013-08-26 LAB — CBG MONITORING, ED: Glucose-Capillary: 154 mg/dL — ABNORMAL HIGH (ref 70–99)

## 2013-08-26 LAB — CBC
HCT: 34.2 % — ABNORMAL LOW (ref 39.0–52.0)
HEMOGLOBIN: 11.9 g/dL — AB (ref 13.0–17.0)
MCH: 32 pg (ref 26.0–34.0)
MCHC: 34.8 g/dL (ref 30.0–36.0)
MCV: 91.9 fL (ref 78.0–100.0)
Platelets: 123 10*3/uL — ABNORMAL LOW (ref 150–400)
RBC: 3.72 MIL/uL — ABNORMAL LOW (ref 4.22–5.81)
RDW: 13.2 % (ref 11.5–15.5)
WBC: 8.3 10*3/uL (ref 4.0–10.5)

## 2013-08-26 LAB — CREATININE, SERUM
CREATININE: 0.78 mg/dL (ref 0.50–1.35)
GFR calc Af Amer: >90 mL/min (ref >90)
GFR calc non Af Amer: >90 mL/min (ref >90)

## 2013-08-26 LAB — URINE MICROSCOPIC-ADD ON

## 2013-08-26 LAB — TSH: TSH: 0.958 u[IU]/mL (ref 0.350–4.500)

## 2013-08-26 MED ORDER — LORAZEPAM 1 MG PO TABS
1.0000 mg | ORAL_TABLET | Freq: Four times a day (QID) | ORAL | Status: DC | PRN
  Administered 2013-08-26 – 2013-08-27 (×3): 1 mg via ORAL
  Filled 2013-08-26 (×3): qty 1

## 2013-08-26 MED ORDER — ONDANSETRON HCL 4 MG/2ML IJ SOLN: 4.0000 mg | Freq: Four times a day (QID) | INTRAMUSCULAR | Status: DC | PRN

## 2013-08-26 MED ORDER — SODIUM CHLORIDE 0.9 % IV SOLN
INTRAVENOUS | Status: DC
  Administered 2013-08-26 – 2013-08-27 (×2): via INTRAVENOUS

## 2013-08-26 MED ORDER — SODIUM CHLORIDE 0.9 % IV BOLUS (SEPSIS)
1000.0000 mL | Freq: Once | INTRAVENOUS | Status: AC
Start: 2013-08-26 — End: 2013-08-26
  Administered 2013-08-26: 1000 mL via INTRAVENOUS

## 2013-08-26 MED ORDER — LORAZEPAM 2 MG/ML IJ SOLN: 0.0000 mg | Freq: Two times a day (BID) | INTRAMUSCULAR | Status: DC

## 2013-08-26 MED ORDER — ESCITALOPRAM OXALATE 20 MG PO TABS
20.0000 mg | ORAL_TABLET | Freq: Every day | ORAL | Status: DC
  Administered 2013-08-26 – 2013-08-27 (×2): 20 mg via ORAL
  Filled 2013-08-26 (×2): qty 1

## 2013-08-26 MED ORDER — SENNOSIDES-DOCUSATE SODIUM 8.6-50 MG PO TABS: 1.0000 | ORAL_TABLET | Freq: Every evening | ORAL | Status: DC | PRN

## 2013-08-26 MED ORDER — LORAZEPAM 2 MG/ML IJ SOLN
1.0000 mg | Freq: Once | INTRAMUSCULAR | Status: AC
  Administered 2013-08-26: 1 mg via INTRAVENOUS
  Filled 2013-08-26: qty 1

## 2013-08-26 MED ORDER — ADULT MULTIVITAMIN W/MINERALS CH
1.0000 | ORAL_TABLET | Freq: Every day | ORAL | Status: DC
  Administered 2013-08-26 – 2013-08-27 (×2): 1 via ORAL
  Filled 2013-08-26 (×2): qty 1

## 2013-08-26 MED ORDER — ENOXAPARIN SODIUM 40 MG/0.4ML ~~LOC~~ SOLN
40.0000 mg | SUBCUTANEOUS | Status: DC
  Administered 2013-08-26: 40 mg via SUBCUTANEOUS
  Filled 2013-08-26 (×2): qty 0.4

## 2013-08-26 MED ORDER — SODIUM CHLORIDE 0.9 % IV BOLUS (SEPSIS)
1000.0000 mL | Freq: Once | INTRAVENOUS | Status: AC
  Administered 2013-08-26: 1000 mL via INTRAVENOUS

## 2013-08-26 MED ORDER — IBUPROFEN 800 MG PO TABS: 400.0000 mg | ORAL_TABLET | Freq: Four times a day (QID) | ORAL | Status: DC | PRN

## 2013-08-26 MED ORDER — LORAZEPAM 2 MG/ML IJ SOLN: 0.0000 mg | Freq: Four times a day (QID) | INTRAMUSCULAR | Status: DC

## 2013-08-26 MED ORDER — THIAMINE HCL 100 MG/ML IJ SOLN
100.0000 mg | Freq: Every day | INTRAMUSCULAR | Status: DC
  Filled 2013-08-26 (×2): qty 1

## 2013-08-26 MED ORDER — ONDANSETRON HCL 4 MG PO TABS: 4.0000 mg | ORAL_TABLET | Freq: Four times a day (QID) | ORAL | Status: DC | PRN

## 2013-08-26 MED ORDER — BUSPIRONE HCL 15 MG PO TABS
7.5000 mg | ORAL_TABLET | Freq: Two times a day (BID) | ORAL | Status: DC
  Administered 2013-08-26 – 2013-08-27 (×2): 7.5 mg via ORAL
  Filled 2013-08-26 (×3): qty 1

## 2013-08-26 MED ORDER — VITAMIN B-1 100 MG PO TABS
100.0000 mg | ORAL_TABLET | Freq: Every day | ORAL | Status: DC
  Administered 2013-08-26 – 2013-08-27 (×2): 100 mg via ORAL
  Filled 2013-08-26 (×2): qty 1

## 2013-08-26 MED ORDER — FOLIC ACID 1 MG PO TABS
1.0000 mg | ORAL_TABLET | Freq: Every day | ORAL | Status: DC
  Administered 2013-08-26 – 2013-08-27 (×2): 1 mg via ORAL
  Filled 2013-08-26 (×2): qty 1

## 2013-08-26 MED ORDER — ONDANSETRON HCL 4 MG/2ML IJ SOLN
4.0000 mg | Freq: Once | INTRAMUSCULAR | Status: AC
  Administered 2013-08-26: 4 mg via INTRAVENOUS
  Filled 2013-08-26: qty 2

## 2013-08-26 MED ORDER — LORAZEPAM 2 MG/ML IJ SOLN: 1.0000 mg | Freq: Four times a day (QID) | INTRAMUSCULAR | Status: DC | PRN

## 2013-08-26 MED ORDER — FAMOTIDINE 20 MG PO TABS
20.0000 mg | ORAL_TABLET | Freq: Every day | ORAL | Status: DC
  Administered 2013-08-26 – 2013-08-27 (×2): 20 mg via ORAL
  Filled 2013-08-26 (×2): qty 1

## 2013-08-26 NOTE — ED Provider Notes (Signed)
CSN: [id number]     Arrival date & time 08/26/13  1047 History   First MD Initiated Contact with Patient 08/26/13 1122     Chief Complaint  Patient presents with  . Weakness  . Hypoglycemia     (Consider location/radiation/quality/duration/timing/severity/associated sxs/prior Treatment) HPI Comments: Patient is a 33 year old man with no significant past medical history other than alcoholism presents today with a chief complaint of nausea and generalized weakness.  He states that for the past 2 weeks he has been working a lot outside without adequate hydration or food intake. His wife states that he drinks 12-18 beers a day and has just recently (as of 2 weeks ago) cut down to 4-5 beers.  He reports that his last drink was 5 beers yesterday.  Today he felt lightheaded and started seeing spots and therefore called EMS.  His CBG was 50 when EMS arrived and his HR was 136. En route to the ED he was given D50, which brought his blood sugar up to 154 upon arrival in the ED.  He was also given Zofran for his nausea.  He reports that his symptoms have improved at this time.   He reports that the last time he ate any food was yesterday.  He also reports that he was out in the sun today and did not drink adequate amount of fluid.  He denies chest pain, SOB, diarrhea,vomiting, abdominal pain, focal weakness, headache, fever, chills, or vision changes at this time.  He was admitted for a very similar presentation in December of 2014.  The history is provided by the patient.    Past Medical History  Diagnosis Date  . GERD (gastroesophageal reflux disease)   . H/O hiatal hernia     pt states he had an EGD at age 33 and had a hiatal hernia  . Dysphagia   . Diverticulitis of colon with bleeding 2010    Hospitalized in 2010 for "intestinal infection requiring IV antibiotics"  . PTSD (post-traumatic stress disorder)   . Alcohol abuse   . Hypoglycemia     recurrent  . Elevated LFTs    Past Surgical  History  Procedure Laterality Date  . Tonsillectomy    . Esophagogastroduodenoscopy      age 16  . Colonoscopy  05/15/2011    Procedure: COLONOSCOPY;  Surgeon: Irene Shipper, MD;  Location: [facility];  Service: Endoscopy;  Laterality: N/A;  . Esophagogastroduodenoscopy  05/15/2011    Procedure: ESOPHAGOGASTRODUODENOSCOPY (EGD);  Surgeon: Irene Shipper, MD;  Location: [facility] ENDOSCOPY;  Service: Endoscopy;  Laterality: N/A;  . Givens capsule study  05/30/2011    Procedure: GIVENS CAPSULE STUDY;  Surgeon: Irene Shipper, MD;  Location: [facility];  Service: Endoscopy;  Laterality: N/A;  Givens Capsule study.   Family History  Problem Relation Age of Onset  . Diabetes type II Mother   . Emphysema Father   . Heart attack Father   . Stroke Father   . Diabetes type II Other   . Diabetes type II Maternal Aunt    History  Substance Use Topics  . Smoking status: Never Smoker   . Smokeless tobacco: Current User    Types: Chew     Comment: States he has been chewing tobacco almost daily since age 88  . Alcohol Use: 1.8 oz/week    3 Cans of beer per week     Comment: average six 12oz beers/day, ranges from 3-12 12oz/day    Review of Systems  All  other systems reviewed and are negative.     Allergies  Review of patient's allergies indicates no known allergies.  Home Medications   Prior to Admission medications   Medication Sig Start Date End Date Taking? Authorizing Courtney Bellizzi  busPIRone (BUSPAR) 7.5 MG tablet Take 7.5 mg by mouth 2 (two) times daily.   Yes Historical Deja Pisarski, MD  escitalopram (LEXAPRO) 20 MG tablet Take 20 mg by mouth daily.   Yes Historical Jayanna Kroeger, MD  ranitidine (ZANTAC) 150 MG tablet Take 150 mg by mouth 2 (two) times daily as needed for heartburn.   Yes Historical Jersie Beel, MD   BP 135/90  Pulse 108  Temp(Src) 97.5 F (36.4 C) (Oral)  Resp 18  SpO2 99% Physical Exam  Nursing note and vitals reviewed. Constitutional: He appears well-developed.  Patient  very thin appearing  HENT:  Head: Normocephalic and atraumatic.  Mouth/Throat: Mucous membranes are dry.  Eyes: EOM are normal. Pupils are equal, round, and reactive to light.  Neck: Normal range of motion. Neck supple.  Cardiovascular: Regular rhythm and normal heart sounds.  Tachycardia present.   Pulmonary/Chest: Effort normal and breath sounds normal.  Abdominal: Soft. Bowel sounds are normal. He exhibits no distension. There is no tenderness. There is no rebound and no guarding.  Neurological: He is alert. He has normal strength. No cranial nerve deficit. Gait normal.  Skin: Skin is warm and dry.  Psychiatric: He has a normal mood and affect.    ED Course  Procedures (including critical care time) Labs Review Labs Reviewed  CBG MONITORING, ED - Abnormal; Notable for the following:    Glucose-Capillary 154 (*)    All other components within normal limits    Imaging Review No results found.   EKG Interpretation None      MDM   Final diagnoses:  None   Patient with a history of Alcoholism presents today with a chief complaint of nausea and generalized weakness.  He was also found to be hypoglycemic with a blood sugar of 54 when he was picked up by EMS.  Upon arrival in the ED the patient had a blood sugar of 154 after given D50 and oral glucose by EMS.  Initially patient was tachycardic, but this improved after IVF.  Labs today show bicarb of 15 and an anion gap of 30.  Patient with ketones in the Urine.  Review of the chart shows that the patient was admitted for Alcoholic Ketoacidosis approximately 6 months ago.  Presentation today very similar to the presentation at that time.  Patient admitted to [facility] for further management.      [name], PA-C 08/27/13 2342

## 2013-08-26 NOTE — ED Notes (Signed)
Pt has been able to keep down 2 cups of ginger ale and eaten half a sandwich.

## 2013-08-26 NOTE — ED Notes (Signed)
Pt given ginger ale to drink, states does not want anything to eat at this time

## 2013-08-26 NOTE — ED Notes (Addendum)
Per EMS, Pt, from home, c/o increasing weakness x 2 weeks and nausea starting this morning.  Denies pain.  Pt found to have a CBG 50 and 136 HR.  Pt given 0.5 tube oral glucose, 12.5G D50, 4mg  Zofran, and 162mL LR en route.  A & Ox4.  Pt reports "the last time I felt like this, he had to have a blood transfusion."  Pt denies bloody stools.

## 2013-08-26 NOTE — ED Notes (Signed)
Pt states he has been feeling bad for about 3 weeks and it has progressively gotten worse, states today started feeling nauseous, dizzy, weak, states last week got heat exhaustion, states has been out working in heat a lot, states felt like blood sugar was low this morning, states at times his blood sugar tends to drop, states has a family hx of DM but he has not been diagnosed w/ DM, states does not have a primary MD. Pt denies vomiting or diarrhea.

## 2013-08-26 NOTE — H&P (Signed)
[facility]          History and Physical    PCP:   Default, Provider, MD   Chief Complaint:  Lightheadedness, spots in his vision, generalized weakness  HPI: Patient is a 33 year old man with no significant past medical history other than alcoholism. He states that for the past 2 weeks he has been working a lot outside without adequate hydration or food intake. His wife states that he drinks 12-18 beers a day and has just recently (as of 2 weeks ago) cut down to 4-5 beers after starting counseling with a psychiatrist at [facility] for his PTSD. He called EMS today as he has very weak and had difficulty getting OOB and when he did he had spots in his vision and felt like he was going to pass out. His CBG was 50 when EMS arrived and his HR was 136. In the ED he was found to have a high anion gap acidosis of 30 with a bicarb of 15. We have been asked to admit him for further evaluation and management.  Allergies:  No Known Allergies    Past Medical History  Diagnosis Date  . GERD (gastroesophageal reflux disease)   . H/O hiatal hernia     pt states he had an EGD at age 7 and had a hiatal hernia  . Dysphagia   . Diverticulitis of colon with bleeding 2010    Hospitalized in 2010 for "intestinal infection requiring IV antibiotics"  . PTSD (post-traumatic stress disorder)   . Alcohol abuse   . Hypoglycemia     recurrent  . Elevated LFTs     Past Surgical History  Procedure Laterality Date  . Tonsillectomy    . Esophagogastroduodenoscopy      age 61  . Colonoscopy  05/15/2011    Procedure: COLONOSCOPY;  Surgeon: Irene Shipper, MD;  Location: [place];  Service: Endoscopy;  Laterality: N/A;  . Esophagogastroduodenoscopy  05/15/2011    Procedure: ESOPHAGOGASTRODUODENOSCOPY (EGD);  Surgeon: Irene Shipper, MD;  Location: [facility] ENDOSCOPY;  Service: Endoscopy;  Laterality: N/A;  . Givens capsule study  05/30/2011    Procedure: GIVENS CAPSULE STUDY;  Surgeon: Irene Shipper, MD;  Location: [place];  Service: Endoscopy;  Laterality: N/A;  Givens Capsule study.    Prior to Admission medications   Medication Sig Start Date End Date Taking? Authorizing Provider  busPIRone (BUSPAR) 7.5 MG tablet Take 7.5 mg by mouth 2 (two) times daily.   Yes Historical Provider, MD  escitalopram (LEXAPRO) 20 MG tablet Take 20 mg by mouth daily.   Yes Historical Provider, MD  ranitidine (ZANTAC) 150 MG tablet Take 150 mg by mouth 2 (two) times daily as needed for heartburn.   Yes Historical Provider, MD    Social History:  reports that he has never smoked. His smokeless tobacco use includes Chew. He reports that he drinks about 1.8 ounces of alcohol per week. He reports that he does not use illicit drugs.  Family History  Problem Relation Age of Onset  . Diabetes type II Mother   . Emphysema Father   . Heart attack Father   . Stroke Father   . Diabetes type II Other   . Diabetes type II Maternal Aunt     Review of Systems:  Constitutional: Denies fever, chills, diaphoresis, appetite change and fatigue.  HEENT: Denies photophobia, eye pain, redness, hearing loss, ear pain, congestion,  sore throat, rhinorrhea, sneezing, mouth sores, trouble swallowing, neck pain, neck stiffness and tinnitus.   Respiratory: Denies SOB, DOE, cough, chest tightness,  and wheezing.   Cardiovascular: Denies chest pain, palpitations and leg swelling.  Gastrointestinal: Denies diarrhea, constipation, blood in stool and abdominal distention.  Genitourinary: Denies dysuria, urgency, frequency, hematuria, flank pain and difficulty urinating.  Endocrine: Denies: hot or cold intolerance, sweats, changes in hair or nails, polyuria, polydipsia. Musculoskeletal: Denies myalgias, back pain, joint swelling, arthralgias and gait problem.  Skin: Denies pallor, rash and wound.  Neurological: Denies dizziness, seizures, syncope, weakness, light-headedness, numbness and headaches.  Hematological: Denies  adenopathy. Easy bruising, personal or family bleeding history  Psychiatric/Behavioral: Denies suicidal ideation, mood changes, confusion, nervousness, sleep disturbance and agitation   Physical Exam: Blood pressure 121/79, pulse 101, temperature 99 F (37.2 C), temperature source Oral, resp. rate 16, SpO2 97.00%. Gen: AA Ox3, NAD HEENT: [place]/AT/PERRL/EOMI/dry mucous membranes Neck: supple, no JVD, no LAD, no bruits, no goiter CV; Tachy, regular, no M/R/G Lungs: CTA B Abd: S/NT/ND/+BS Ext: no C/C/E Neuro: grossly intact and non-focal  Labs on Admission:  Results for orders placed during the hospital encounter of 08/26/13 (from the past 48 hour(s))  CBG MONITORING, ED     Status: Abnormal   Collection Time    08/26/13 10:50 AM      Result Value Ref Range   Glucose-Capillary 154 (*) 70 - 99 mg/dL   Comment 1 Documented in Chart     Comment 2 Notify RN    URINALYSIS, ROUTINE W REFLEX MICROSCOPIC     Status: Abnormal   Collection Time    08/26/13 12:16 PM      Result Value Ref Range   Color, Urine YELLOW  YELLOW   APPearance CLEAR  CLEAR   Specific Gravity, Urine 1.011  1.005 - 1.030   pH 5.5  5.0 - 8.0   Glucose, UA 100 (*) NEGATIVE mg/dL   Hgb urine dipstick TRACE (*) NEGATIVE   Bilirubin Urine NEGATIVE  NEGATIVE   Ketones, ur >80 (*) NEGATIVE mg/dL   Protein, ur NEGATIVE  NEGATIVE mg/dL   Urobilinogen, UA 0.2  0.0 - 1.0 mg/dL   Nitrite NEGATIVE  NEGATIVE   Leukocytes, UA NEGATIVE  NEGATIVE  URINE MICROSCOPIC-ADD ON     Status: None   Collection Time    08/26/13 12:16 PM      Result Value Ref Range   Squamous Epithelial / LPF RARE  RARE   WBC, UA 0-2  <3 WBC/hpf   Bacteria, UA RARE  RARE  CBC WITH DIFFERENTIAL     Status: Abnormal   Collection Time    08/26/13 12:32 PM      Result Value Ref Range   WBC 14.8 (*) 4.0 - 10.5 K/uL   RBC 4.41  4.22 - 5.81 MIL/uL   Hemoglobin 14.1  13.0 - 17.0 g/dL   HCT 41.3  39.0 - 52.0 %   MCV 93.7  78.0 - 100.0 fL   MCH 32.0  26.0 -  34.0 pg   MCHC 34.1  30.0 - 36.0 g/dL   RDW 13.4  11.5 - 15.5 %   Platelets 139 (*) 150 - 400 K/uL   Neutrophils Relative % 92 (*) 43 - 77 %   Neutro Abs 13.5 (*) 1.7 - 7.7 K/uL   Lymphocytes Relative 3 (*) 12 - 46 %   Lymphs Abs 0.5 (*) 0.7 - 4.0 K/uL   Monocytes Relative 5  3 - 12 %  Monocytes Absolute 0.8  0.1 - 1.0 K/uL   Eosinophils Relative 0  0 - 5 %   Eosinophils Absolute 0.0  0.0 - 0.7 K/uL   Basophils Relative 0  0 - 1 %   Basophils Absolute 0.0  0.0 - 0.1 K/uL  COMPREHENSIVE METABOLIC PANEL     Status: Abnormal   Collection Time    08/26/13 12:32 PM      Result Value Ref Range   Sodium 140  137 - 147 mEq/L   Potassium 4.7  3.7 - 5.3 mEq/L   Chloride 95 (*) 96 - 112 mEq/L   CO2 15 (*) 19 - 32 mEq/L   Glucose, Bld 102 (*) 70 - 99 mg/dL   BUN 11  6 - 23 mg/dL   Creatinine, Ser 0.74  0.50 - 1.35 mg/dL   Calcium 8.6  8.4 - 10.5 mg/dL   Total Protein 7.1  6.0 - 8.3 g/dL   Albumin 4.4  3.5 - 5.2 g/dL   AST 137 (*) 0 - 37 U/L   ALT 88 (*) 0 - 53 U/L   Alkaline Phosphatase 60  39 - 117 U/L   Total Bilirubin 0.7  0.3 - 1.2 mg/dL   GFR calc non Af Amer >90  >90 mL/min   GFR calc Af Amer >90  >90 mL/min   Comment: (NOTE)     The eGFR has been calculated using the CKD EPI equation.     This calculation has not been validated in all clinical situations.     eGFR's persistently <90 mL/min signify possible Chronic Kidney     Disease.    Radiological Exams on Admission: No results found.  Assessment/Plan Principal Problem:   Alcoholic ketoacidosis Active Problems:   GERD (gastroesophageal reflux disease)   Alcohol abuse   Transaminitis   Hypoglycemia   Metabolic acidosis    High Anion Gap Acidosis -Likely 2/2 a combination of alcoholic and starvation ketoacidosis. -Admit for IVF, nutrition. -Recheck BMET in am. -Denies use of methanol/ethylene glycol. -Had a similar admission in 12/14.  Alcohol Abuse -Thiamine/folate -Monitor on CIWA protocol. -Is already  under the care of a psychiatrist and therapist at [facility]. -Further counseling has been provided.  Transaminitis -Has the typical AST:ALT split seen with ETOH. -Monitor.  Hypoglycemia -Likely due to starvation while under the influence of alcohol; he appears quite thin and is likely malnourished. -Check A1C. -Follow CBGs.  DVT Prophylaxis -Lovenox  Code Status -Full Code     Time Spent on Admission: 75 minutes  HERNANDEZ ACOSTA,ESTELA [facility] Pager: [phone] 08/26/2013, 5:38 PM

## 2013-08-26 NOTE — ED Notes (Signed)
Bed: WA19 Expected date:  Expected time:  Means of arrival:  Comments: 

## 2013-08-26 NOTE — ED Provider Notes (Signed)
Medical screening examination/treatment/procedure(s) were conducted as a shared visit with non-physician practitioner(s) and myself.  I personally evaluated the patient during the encounter.   EKG Interpretation None      33 yo male with hx of alcohol abuse presenting with malaise and vomiting.  On exam, well appearing, nontoxic, not distressed, normal respiratory effort, normal perfusion, abd soft and nontender.  Labwork concerning with anion gap of 30.  Recent admission for alcoholic ketoacidosis.  Plan IV fluids, antiemetics, admission.   Clinical Impression: 1. Hypoglycemia   2. Alcoholic ketoacidosis   3. Alcohol abuse     Houston Siren III, MD 08/29/13 [phone]

## 2013-08-27 LAB — COMPREHENSIVE METABOLIC PANEL
ALK PHOS: 44 U/L (ref 39–117)
ALT: 58 U/L — ABNORMAL HIGH (ref 0–53)
AST: 91 U/L — ABNORMAL HIGH (ref 0–37)
Albumin: 3.3 g/dL — ABNORMAL LOW (ref 3.5–5.2)
BUN: 9 mg/dL (ref 6–23)
CO2: 27 mEq/L (ref 19–32)
Calcium: 8.3 mg/dL — ABNORMAL LOW (ref 8.4–10.5)
Chloride: 99 mEq/L (ref 96–112)
Creatinine, Ser: 0.74 mg/dL (ref 0.50–1.35)
GFR calc Af Amer: >90 mL/min (ref >90)
GFR calc non Af Amer: >90 mL/min (ref >90)
GLUCOSE: 85 mg/dL (ref 70–99)
POTASSIUM: 4.5 meq/L (ref 3.7–5.3)
Sodium: 136 mEq/L — ABNORMAL LOW (ref 137–147)
Total Bilirubin: 1 mg/dL (ref 0.3–1.2)
Total Protein: 5.5 g/dL — ABNORMAL LOW (ref 6.0–8.3)

## 2013-08-27 LAB — CBC
HCT: 35.2 % — ABNORMAL LOW (ref 39.0–52.0)
Hemoglobin: 12 g/dL — ABNORMAL LOW (ref 13.0–17.0)
MCH: 31.5 pg (ref 26.0–34.0)
MCHC: 34.1 g/dL (ref 30.0–36.0)
MCV: 92.4 fL (ref 78.0–100.0)
Platelets: 126 10*3/uL — ABNORMAL LOW (ref 150–400)
RBC: 3.81 MIL/uL — ABNORMAL LOW (ref 4.22–5.81)
RDW: 13.2 % (ref 11.5–15.5)
WBC: 5.6 10*3/uL (ref 4.0–10.5)

## 2013-08-27 LAB — HEMOGLOBIN A1C
Hgb A1c MFr Bld: 5.4 % (ref <5.7)
Mean Plasma Glucose: 108 mg/dL (ref <117)

## 2013-08-27 MED ORDER — FOLIC ACID 1 MG PO TABS: 1.0000 mg | ORAL_TABLET | Freq: Every day | ORAL | Status: DC

## 2013-08-27 MED ORDER — THIAMINE HCL 100 MG PO TABS: 100.0000 mg | ORAL_TABLET | Freq: Every day | ORAL | Status: DC

## 2013-08-27 MED ORDER — ENSURE PUDDING PO PUDG
1.0000 | Freq: Two times a day (BID) | ORAL | Status: DC
  Filled 2013-08-27 (×2): qty 1

## 2013-08-27 NOTE — Discharge Summary (Signed)
Physician Discharge Summary  Samuel [name] JGG:[id number] DOB: [birth year] DOA: 08/26/2013  PCP: Sabree Nuon, MD  Admit date: 08/26/2013 Discharge date: 08/27/2013  Time spent: 78* minutes  Recommendations for Outpatient Follow-up:  1. *Follow up PCP in 2 weeks  Discharge Diagnoses:  Principal Problem:   Alcoholic ketoacidosis Active Problems:   GERD (gastroesophageal reflux disease)   Alcohol abuse   Transaminitis   Hypoglycemia   Metabolic acidosis   Discharge Condition: Stable  Diet recommendation: Regular diet  Filed Weights   08/26/13 1732  Weight: 54.885 kg (121 lb)    History of present illness:  33 year old man with no significant past medical history other than alcoholism. He states that for the past 2 weeks he has been working a lot outside without adequate hydration or food intake. His wife states that he drinks 12-18 beers a day and has just recently (as of 2 weeks ago) cut down to 4-5 beers after starting counseling with a psychiatrist at [facility] for his PTSD. He called EMS today as he has very weak and had difficulty getting OOB and when he did he had spots in his vision and felt like he was going to pass out. His CBG was 50 when EMS arrived and his HR was 136. In the ED he was found to have a high anion gap acidosis of 30 with a bicarb of 15. We have been asked to admit him for further evaluation and management   Hospital Course:  High Anion Gap Acidosis  -Likely 2/2 a combination of alcoholic and starvation ketoacidosis. Resolved  - Bicarb is now 10 -was given  IVF, -Denies use of methanol/ethylene glycol.   Alcohol Abuse  - No signs and symptoms of alcohol withdrawl -Thiamine/folate  -Is already under the care of a psychiatrist and therapist at [facility].  -Further counseling has been provided.   Transaminitis  - Improved -Has the typical AST:ALT split seen with ETOH.   Hypoglycemia  - Resolved -Likely due to starvation while under the influence  of alcohol; he appears quite thin and is likely malnourished.  -Check A1C is 5.4     Procedures:  None  Consultations:  None  Discharge Exam: Filed Vitals:   08/27/13 0635  BP: 126/78  Pulse: 73  Temp: 98.4 F (36.9 C)  Resp: 16    General: Appear in no acute distress Cardiovascula: S1s2 RRR Respiratory: Clear bilaterally  Discharge Instructions You were cared for by a hospitalist during your hospital stay. If you have any questions about your discharge medications or the care you received while you were in the hospital after you are discharged, you can call the unit and asked to speak with the hospitalist on call if the hospitalist that took care of you is not available. Once you are discharged, your primary care physician will handle any further medical issues. Please note that NO REFILLS for any discharge medications will be authorized once you are discharged, as it is imperative that you return to your primary care physician (or establish a relationship with a primary care physician if you do not have one) for your aftercare needs so that they can reassess your need for medications and monitor your lab values.  Discharge Instructions   Diet - low sodium heart healthy    Complete by:  As directed      Discharge instructions    Complete by:  As directed   Patient needs rest for two days, Can return to work on 08/30/13  Increase activity slowly    Complete by:  As directed             Medication List         busPIRone 7.5 MG tablet  Commonly known as:  BUSPAR  Take 7.5 mg by mouth 2 (two) times daily.     escitalopram 20 MG tablet  Commonly known as:  LEXAPRO  Take 20 mg by mouth daily.     folic acid 1 MG tablet  Commonly known as:  FOLVITE  Take 1 tablet (1 mg total) by mouth daily.     ranitidine 150 MG tablet  Commonly known as:  ZANTAC  Take 150 mg by mouth 2 (two) times daily as needed for heartburn.     thiamine 100 MG tablet  Take 1 tablet  (100 mg total) by mouth daily.       No Known Allergies     Follow-up Information   Follow up with [place]. Schedule an appointment as soon as possible for a visit in 2 weeks.   Contact information:   [place] [address] [phone]       The results of significant diagnostics from this hospitalization (including imaging, microbiology, ancillary and laboratory) are listed below for reference.    Significant Diagnostic Studies: No results found.  Microbiology: No results found for this or any previous visit (from the past 240 hour(s)).   Labs: Basic Metabolic Panel:  Recent Labs Lab 08/26/13 1232 08/26/13 1800 08/27/13 0500  NA 140  --  136*  K 4.7  --  4.5  CL 95*  --  99  CO2 15*  --  27  GLUCOSE 102*  --  85  BUN 11  --  9  CREATININE 0.74 0.78 0.74  CALCIUM 8.6  --  8.3*   Liver Function Tests:  Recent Labs Lab 08/26/13 1232 08/27/13 0500  AST 137* 91*  ALT 88* 58*  ALKPHOS 60 44  BILITOT 0.7 1.0  PROT 7.1 5.5*  ALBUMIN 4.4 3.3*   No results found for this basename: LIPASE, AMYLASE,  in the last 168 hours No results found for this basename: AMMONIA,  in the last 168 hours CBC:  Recent Labs Lab 08/26/13 1232 08/26/13 1800 08/27/13 0500  WBC 14.8* 8.3 5.6  NEUTROABS 13.5*  --   --   HGB 14.1 11.9* 12.0*  HCT 41.3 34.2* 35.2*  MCV 93.7 91.9 92.4  PLT 139* 123* 126*   Cardiac Enzymes: No results found for this basename: CKTOTAL, CKMB, CKMBINDEX, TROPONINI,  in the last 168 hours BNP: BNP (last 3 results) No results found for this basename: PROBNP,  in the last 8760 hours CBG:  Recent Labs Lab 08/26/13 1050  GLUCAP 154*       Signed:  LAMA,GAGAN S  [facility] 08/27/2013, 10:12 AM

## 2013-08-27 NOTE — Care Management Note (Addendum)
    Page 1 of 1   08/27/2013     11:16:19 AM CARE MANAGEMENT NOTE 08/27/2013  Patient:  EMORY, GALLENTINE   Account Number:  [id number]  Date Initiated:  08/27/2013  Documentation initiated by:  [facility]  Subjective/Objective Assessment:   adm: Lightheadedness, spots in his vision, generalized weakness:  Alcoholic ketoacidosis     Action/Plan:   discharge plannnig: [facility]   Anticipated DC Date:  08/27/2013   Anticipated DC Plan:  [place]  CM consult  [facility]  PCP issues      Choice offered to / List presented to:             Status of service:  Completed, signed off Medicare Important Message given?   (If response is "NO", the following Medicare IM given date fields will be blank) Date Medicare IM given:   Date Additional Medicare IM given:    Discharge Disposition:  HOME/SELF CARE  Per UR Regulation:  Reviewed for med. necessity/level of care/duration of stay  If discussed at [place] of Stay Meetings, dates discussed:    Comments:  08/27/13 10:45 CM met with pt in room and gave him [facility] pamphlet with appt for orange card, to secure PCP, and to initiate insurance/medicaid application on September 17, 2013 at 12:00.  Asencion Partridge of [facility] states pt missed his first appt on 04/09/13 and requested I explain policy.  Pt verbalized understanding of importance of keeping appt and verbalized understanding if he does not give 24 hour notice prior to not showing for this appt, he will not be able to use [facility] appt services.  [facility] will call pt for a medical appt.  Pt will use $4 list for today's prescription of folic acid and thiamine.  No other CM needs were communicated.  Mariane Masters, BSN, CM [phone].

## 2013-08-27 NOTE — Progress Notes (Signed)
Nursing Discharge Summary  Patient ID: Samuel [name] MRN: [id number] DOB/AGE: 1980-05-27 33 y.o.  Admit date: 08/26/2013 Discharge date: 08/27/2013  Discharged Condition: good  Disposition: 01-Home or Self Care  Follow-up Information   Follow up with [place]. Schedule an appointment as soon as possible for a visit in 2 weeks.   Contact information:   [place] [address] [phone]      Follow up with [place]    . [facility] will call you with an appt for medical follow up care; [place] [address] AT 12:00PM)    Contact information:   [place] [address] [phone]      Prescriptions Given: Prescriptions given to significant other at bedside for thiamine and folic acid. Patient and significant other verbalized understanding of follow up appointments, medications, and when to call the physician. Appointment with community health clinic set up.  Means of Discharge: Patient to ambulate downstairs with significant other to be discharged home via private vehicle.   Signed: Buel Ream 08/27/2013, 11:11 AM

## 2013-08-29 NOTE — ED Provider Notes (Signed)
Medical screening examination/treatment/procedure(s) were conducted as a shared visit with non-physician practitioner(s) and myself.  I personally evaluated the patient during the encounter.   Please see my separate note.     Houston Siren III, MD 08/29/13 [phone]

## 2013-09-17 ENCOUNTER — Ambulatory Visit: Payer: Self-pay

## 2014-02-21 ENCOUNTER — Encounter (HOSPITAL_COMMUNITY): Payer: Self-pay | Admitting: Emergency Medicine

## 2014-02-21 ENCOUNTER — Emergency Department (HOSPITAL_COMMUNITY)
Admission: EM | Admit: 2014-02-21 | Discharge: 2014-02-21 | Disposition: A | Discharge status: Home / Self Care | Payer: Self-pay | Attending: Emergency Medicine | Admitting: Emergency Medicine

## 2014-02-21 DIAGNOSIS — H01005 Unspecified blepharitis left lower eyelid: Secondary | ICD-10-CM | POA: Insufficient documentation

## 2014-02-21 DIAGNOSIS — Z79899 Other long term (current) drug therapy: Secondary | ICD-10-CM | POA: Insufficient documentation

## 2014-02-21 DIAGNOSIS — Z8639 Personal history of other endocrine, nutritional and metabolic disease: Secondary | ICD-10-CM | POA: Insufficient documentation

## 2014-02-21 DIAGNOSIS — Z8659 Personal history of other mental and behavioral disorders: Secondary | ICD-10-CM | POA: Insufficient documentation

## 2014-02-21 DIAGNOSIS — H01006 Unspecified blepharitis left eye, unspecified eyelid: Secondary | ICD-10-CM

## 2014-02-21 DIAGNOSIS — K219 Gastro-esophageal reflux disease without esophagitis: Secondary | ICD-10-CM | POA: Insufficient documentation

## 2014-02-21 MED ORDER — FLUORESCEIN SODIUM 1 MG OP STRP
1.0000 | ORAL_STRIP | Freq: Once | OPHTHALMIC | Status: AC
  Administered 2014-02-21: 1 via OPHTHALMIC
  Filled 2014-02-21: qty 1

## 2014-02-21 MED ORDER — TETRACAINE HCL 0.5 % OP SOLN
2.0000 [drp] | Freq: Once | OPHTHALMIC | Status: AC
  Administered 2014-02-21: 2 [drp] via OPHTHALMIC
  Filled 2014-02-21: qty 2

## 2014-02-21 MED ORDER — ERYTHROMYCIN 5 MG/GM OP OINT: TOPICAL_OINTMENT | OPHTHALMIC | Status: DC

## 2014-02-21 NOTE — Discharge Instructions (Signed)
Blepharitis Blepharitis is redness, soreness, and swelling (inflammation) of one or both eyelids. It may be caused by an allergic reaction or a bacterial infection. Blepharitis may also be associated with reddened, scaly skin (seborrhea) of the scalp and eyebrows. While you sleep, eye discharge may cause your eyelashes to stick together. Your eyelids may itch, burn, swell, and may lose their lashes. These will grow back. Your eyes may become sensitive. Blepharitis may recur and need repeated treatment. If this is the case, you may require further evaluation by an eye specialist (ophthalmologist). HOME CARE INSTRUCTIONS   Keep your hands clean.  Use a clean towel each time you dry your eyelids. Do not use this towel to clean other areas. Do not share a towel or makeup with anyone.  Wash your eyelids with warm water or warm water mixed with a small amount of baby shampoo. Do this twice a day or as often as needed.  Wash your face and eyebrows at least once a day.  Use warm compresses 2 times a day for 10 minutes at a time, or as directed by your caregiver.  Apply antibiotic ointment as directed by your caregiver.  Avoid rubbing your eyes.  Avoid wearing makeup until you get better.  Follow up with your caregiver as directed. SEEK IMMEDIATE MEDICAL CARE IF:   You have pain, redness, or swelling that gets worse or spreads to other parts of your face.  Your vision changes, or you have pain when looking at lights or moving objects.  You have a fever.  Your symptoms continue for longer than 2 to 4 days or become worse. MAKE SURE YOU:   Understand these instructions.  Will watch your condition.  Will get help right away if you are not doing well or get worse. Document Released: 02/19/2000 Document Revised: 05/16/2011 Document Reviewed: 03/31/2010 ExitCare Patient Information 2015 [employer]. This information is not intended to replace advice given to you by your health care  provider. Make sure you discuss any questions you have with your health care provider.  

## 2014-02-21 NOTE — ED Provider Notes (Signed)
CSN: [id number]     Arrival date & time 02/21/14  1002 History  This chart was scribed for non-physician practitioner, [name], PA-C, working with [name], MD by [name], ED Scribe. This patient was seen in room TR04C/TR04C and the patient's care was started at 10:52 AM.   Chief Complaint  Patient presents with  . Eye Injury   The history is provided by the patient. No language interpreter was used.   HPI Comments: Samuel [name] is a 33 y.o. male, with a h/o hypoglycemia, GERD, PTSD, alcohol abuse, who presents to the Emergency Department complaining of L eye pain onset a few days ago. Pt works with heating and air conditioning units. He reports that he was drilling a hole a few days ago when he felt something go into his L eye. Pt was wearing safety glasses during the time of the incident. He describes the pain as "fingernails" scratching his eyes with blinking and closing his eyes. He also reports associated L eye redness of the lower eye lid,  swelling,  onset a few days following the incident. Pt denies fever, chills. Pt does not wear corrective lenses. Pt reports washing his eye out with temporary relief. No medications tried.  Past Medical History  Diagnosis Date  . GERD (gastroesophageal reflux disease)   . H/O hiatal hernia     pt states he had an EGD at age 18 and had a hiatal hernia  . Dysphagia   . Diverticulitis of colon with bleeding 2010    Hospitalized in 2010 for "intestinal infection requiring IV antibiotics"  . PTSD (post-traumatic stress disorder)   . Alcohol abuse   . Hypoglycemia     recurrent  . Elevated LFTs    Past Surgical History  Procedure Laterality Date  . Tonsillectomy    . Esophagogastroduodenoscopy      age 37  . Colonoscopy  05/15/2011    Procedure: COLONOSCOPY;  Surgeon: Irene Shipper, MD;  Location: [place];  Service: Endoscopy;  Laterality: N/A;  . Esophagogastroduodenoscopy  05/15/2011    Procedure:  ESOPHAGOGASTRODUODENOSCOPY (EGD);  Surgeon: Irene Shipper, MD;  Location: [facility] ENDOSCOPY;  Service: Endoscopy;  Laterality: N/A;  . Givens capsule study  05/30/2011    Procedure: GIVENS CAPSULE STUDY;  Surgeon: Irene Shipper, MD;  Location: [place];  Service: Endoscopy;  Laterality: N/A;  Givens Capsule study.   Family History  Problem Relation Age of Onset  . Diabetes type II Mother   . Emphysema Father   . Heart attack Father   . Stroke Father   . Diabetes type II Other   . Diabetes type II Maternal Aunt    History  Substance Use Topics  . Smoking status: Never Smoker   . Smokeless tobacco: Current User    Types: Chew     Comment: States he has been chewing tobacco almost daily since age 60  . Alcohol Use: 1.8 oz/week    3 Cans of beer per week     Comment: average six 12oz beers/day, ranges from 3-12 12oz/day    Review of Systems  Constitutional: Negative for fever and chills.  Eyes: Positive for pain and redness.  All other systems reviewed and are negative.  Allergies  Review of patient's allergies indicates no known allergies.  Home Medications   Prior to Admission medications   Medication Sig Start Date End Date Taking? Authorizing Provider  busPIRone (BUSPAR) 7.5 MG tablet Take 7.5 mg by mouth 2 (two) times  daily.    Historical Provider, MD  escitalopram (LEXAPRO) 20 MG tablet Take 20 mg by mouth daily.    Historical Provider, MD  folic acid (FOLVITE) 1 MG tablet Take 1 tablet (1 mg total) by mouth daily. 08/27/13   [name], MD  ranitidine (ZANTAC) 150 MG tablet Take 150 mg by mouth 2 (two) times daily as needed for heartburn.    Historical Provider, MD  thiamine 100 MG tablet Take 1 tablet (100 mg total) by mouth daily. 08/27/13   [name], MD   Triage Vitals: BP 123/86 mmHg  Pulse 95  Temp(Src) 97.6 F (36.4 C) (Oral)  Resp 24  Wt 120 lb (54.432 kg)  SpO2 98% Physical Exam  Constitutional: He is oriented to person, place, and time. He appears  well-developed and well-nourished. No distress.  HENT:  Head: Normocephalic and atraumatic.  Eyes: Conjunctivae and EOM are normal. Pupils are equal, round, and reactive to light. Lids are everted and swept, no foreign bodies found. Right eye exhibits no exudate. No foreign body present in the right eye. Left eye exhibits no exudate. No foreign body present in the left eye. Right conjunctiva is not injected. Left conjunctiva is not injected.  Slit lamp exam:      The left eye shows no corneal abrasion, no corneal ulcer, no foreign body and no fluorescein uptake.  Erythema and scaling of lateral L lower eye lid. Scaling of L lower eye lashes. No injection of the eye.  No periorbital edema   Visual Acuity  Right Eye Distance: 15/20 Left Eye Distance: 25/20 Bilateral Distance: 25/20  Right Eye Near: R Near: 20/20 Left Eye Near:  L Near: 20/2-0 Bilateral Near:  20/20  Neck: Neck supple. No tracheal deviation present.  Cardiovascular: Normal rate, regular rhythm and normal heart sounds.   Pulmonary/Chest: Effort normal and breath sounds normal. No respiratory distress.  Musculoskeletal: Normal range of motion.  Neurological: He is alert and oriented to person, place, and time.  Skin: Skin is warm and dry.  Psychiatric: He has a normal mood and affect. His behavior is normal.  Nursing note and vitals reviewed.  ED Course  Procedures (including critical care time) DIAGNOSTIC STUDIES: Oxygen Saturation is 98% on RA, normal by my interpretation.    COORDINATION OF CARE: 11:02 AM-Discussed treatment plan which includes fluorescein exam and Erythromycin ointment with pt at bedside and pt agreed to plan.   Labs Review Labs Reviewed - No data to display  Imaging Review No results found.   EKG Interpretation None      MDM   Final diagnoses:  Blepharitis of eyelid of left eye   Patient presenting with redness of the lower eye lid of his eye.  Appearance most consistent with Acute  Blepharitis.  No signs of Conjunctivitis.  No pain of the eye itself, just some discomfort of the lower eye lid.  No evidence of foreign body or Corneal Abrasion on exam.  Normal visual acuity.  Feel that the patient is stable for discharge.  Return precautions given.  I personally performed the services described in this documentation, which was scribed in my presence. The recorded information has been reviewed and is accurate.    [name], PA-C 02/22/14 2214  [name], MD 02/23/14 [phone]

## 2014-02-21 NOTE — ED Notes (Signed)
Pt works as Building control surveyor, felt something go into left eye, after 2 days started with left eye pain and redness. Vision intact.

## 2014-04-08 ENCOUNTER — Encounter (HOSPITAL_COMMUNITY): Payer: Self-pay | Admitting: Emergency Medicine

## 2014-04-08 ENCOUNTER — Inpatient Hospital Stay (HOSPITAL_COMMUNITY): Payer: Medicaid Other

## 2014-04-08 ENCOUNTER — Inpatient Hospital Stay (HOSPITAL_COMMUNITY)
Admission: EM | Admit: 2014-04-08 | Discharge: 2014-04-10 | DRG: 392 | Disposition: A | Discharge status: Home / Self Care | Payer: Medicaid Other | Attending: Family Medicine | Admitting: Family Medicine

## 2014-04-08 DIAGNOSIS — K760 Fatty (change of) liver, not elsewhere classified: Secondary | ICD-10-CM | POA: Diagnosis present

## 2014-04-08 DIAGNOSIS — R740 Nonspecific elevation of levels of transaminase and lactic acid dehydrogenase [LDH]: Secondary | ICD-10-CM | POA: Diagnosis present

## 2014-04-08 DIAGNOSIS — K219 Gastro-esophageal reflux disease without esophagitis: Secondary | ICD-10-CM | POA: Diagnosis present

## 2014-04-08 DIAGNOSIS — F17220 Nicotine dependence, chewing tobacco, uncomplicated: Secondary | ICD-10-CM | POA: Diagnosis present

## 2014-04-08 DIAGNOSIS — E872 Acidosis, unspecified: Secondary | ICD-10-CM | POA: Diagnosis present

## 2014-04-08 DIAGNOSIS — E162 Hypoglycemia, unspecified: Secondary | ICD-10-CM | POA: Diagnosis present

## 2014-04-08 DIAGNOSIS — Z833 Family history of diabetes mellitus: Secondary | ICD-10-CM | POA: Diagnosis not present

## 2014-04-08 DIAGNOSIS — K2920 Alcoholic gastritis without bleeding: Principal | ICD-10-CM | POA: Diagnosis present

## 2014-04-08 DIAGNOSIS — Z79899 Other long term (current) drug therapy: Secondary | ICD-10-CM

## 2014-04-08 DIAGNOSIS — Z823 Family history of stroke: Secondary | ICD-10-CM

## 2014-04-08 DIAGNOSIS — R7401 Elevation of levels of liver transaminase levels: Secondary | ICD-10-CM | POA: Diagnosis present

## 2014-04-08 DIAGNOSIS — R112 Nausea with vomiting, unspecified: Secondary | ICD-10-CM

## 2014-04-08 DIAGNOSIS — F1010 Alcohol abuse, uncomplicated: Secondary | ICD-10-CM | POA: Diagnosis present

## 2014-04-08 DIAGNOSIS — Z825 Family history of asthma and other chronic lower respiratory diseases: Secondary | ICD-10-CM | POA: Diagnosis not present

## 2014-04-08 DIAGNOSIS — E860 Dehydration: Secondary | ICD-10-CM | POA: Diagnosis not present

## 2014-04-08 DIAGNOSIS — D72829 Elevated white blood cell count, unspecified: Secondary | ICD-10-CM | POA: Diagnosis present

## 2014-04-08 LAB — BLOOD GAS, ARTERIAL
Acid-base deficit: 5.9 mmol/L — ABNORMAL HIGH (ref 0.0–2.0)
BICARBONATE: 18.6 meq/L — AB (ref 20.0–24.0)
Drawn by: 295031
FIO2: 0.21 %
O2 SAT: 96.4 %
PCO2 ART: 35.2 mmHg (ref 35.0–45.0)
PO2 ART: 94.1 mmHg (ref 80.0–100.0)
Patient temperature: 98.6
TCO2: 17 mmol/L (ref 0–100)
pH, Arterial: 7.344 — ABNORMAL LOW (ref 7.350–7.450)

## 2014-04-08 LAB — COMPREHENSIVE METABOLIC PANEL
ALBUMIN: 5.6 g/dL — AB (ref 3.5–5.2)
ALBUMIN: 3.7 g/dL (ref 3.5–5.2)
ALK PHOS: 35 U/L — AB (ref 39–117)
ALT: 78 U/L — AB (ref 0–53)
ALT: 54 U/L — AB (ref 0–53)
AST: 117 U/L — ABNORMAL HIGH (ref 0–37)
AST: 72 U/L — ABNORMAL HIGH (ref 0–37)
Alkaline Phosphatase: 57 U/L (ref 39–117)
Anion gap: 26 — ABNORMAL HIGH (ref 5–15)
Anion gap: 11 (ref 5–15)
BILIRUBIN TOTAL: 1.2 mg/dL (ref 0.3–1.2)
BUN: 15 mg/dL (ref 6–23)
BUN: 11 mg/dL (ref 6–23)
CO2: 16 mmol/L — ABNORMAL LOW (ref 19–32)
CO2: 21 mmol/L (ref 19–32)
CREATININE: 1.12 mg/dL (ref 0.50–1.35)
Calcium: 9.2 mg/dL (ref 8.4–10.5)
Calcium: 7.6 mg/dL — ABNORMAL LOW (ref 8.4–10.5)
Chloride: 98 mmol/L (ref 96–112)
Chloride: 103 mmol/L (ref 96–112)
Creatinine, Ser: 0.86 mg/dL (ref 0.50–1.35)
GFR calc Af Amer: >90 mL/min (ref >90)
GFR, EST NON AFRICAN AMERICAN: 84 mL/min — AB (ref >90)
Glucose, Bld: 70 mg/dL (ref 70–99)
Glucose, Bld: 120 mg/dL — ABNORMAL HIGH (ref 70–99)
Potassium: 4.8 mmol/L (ref 3.5–5.1)
Potassium: 4.3 mmol/L (ref 3.5–5.1)
Sodium: 140 mmol/L (ref 135–145)
Sodium: 135 mmol/L (ref 135–145)
TOTAL PROTEIN: 5.7 g/dL — AB (ref 6.0–8.3)
Total Bilirubin: 0.9 mg/dL (ref 0.3–1.2)
Total Protein: 8.1 g/dL (ref 6.0–8.3)

## 2014-04-08 LAB — RAPID URINE DRUG SCREEN, HOSP PERFORMED
AMPHETAMINES: NOT DETECTED
BENZODIAZEPINES: NOT DETECTED
Barbiturates: NOT DETECTED
Cocaine: NOT DETECTED
Opiates: NOT DETECTED
Tetrahydrocannabinol: NOT DETECTED

## 2014-04-08 LAB — CBC WITH DIFFERENTIAL/PLATELET
Basophils Absolute: 0 10*3/uL (ref 0.0–0.1)
Basophils Relative: 0 % (ref 0–1)
Eosinophils Absolute: 0 10*3/uL (ref 0.0–0.7)
Eosinophils Relative: 0 % (ref 0–5)
HEMATOCRIT: 44.4 % (ref 39.0–52.0)
Hemoglobin: 15.1 g/dL (ref 13.0–17.0)
LYMPHS PCT: 3 % — AB (ref 12–46)
Lymphs Abs: 0.6 10*3/uL — ABNORMAL LOW (ref 0.7–4.0)
MCH: 33 pg (ref 26.0–34.0)
MCHC: 34 g/dL (ref 30.0–36.0)
MCV: 97.2 fL (ref 78.0–100.0)
MONOS PCT: 5 % (ref 3–12)
Monocytes Absolute: 0.8 10*3/uL (ref 0.1–1.0)
Neutro Abs: 15 10*3/uL — ABNORMAL HIGH (ref 1.7–7.7)
Neutrophils Relative %: 92 % — ABNORMAL HIGH (ref 43–77)
Platelets: 201 10*3/uL (ref 150–400)
RBC: 4.57 MIL/uL (ref 4.22–5.81)
RDW: 12.7 % (ref 11.5–15.5)
WBC: 16.4 10*3/uL — ABNORMAL HIGH (ref 4.0–10.5)

## 2014-04-08 LAB — SALICYLATE LEVEL: Salicylate Lvl: <4 mg/dL (ref 2.8–20.0)

## 2014-04-08 LAB — URINALYSIS, ROUTINE W REFLEX MICROSCOPIC
Bilirubin Urine: NEGATIVE
Leukocytes, UA: NEGATIVE
NITRITE: NEGATIVE
PROTEIN: NEGATIVE mg/dL
Specific Gravity, Urine: 1.018 (ref 1.005–1.030)
UROBILINOGEN UA: 0.2 mg/dL (ref 0.0–1.0)
pH: 5 (ref 5.0–8.0)

## 2014-04-08 LAB — GLUCOSE, CAPILLARY
Glucose-Capillary: 56 mg/dL — ABNORMAL LOW (ref 70–99)
Glucose-Capillary: 164 mg/dL — ABNORMAL HIGH (ref 70–99)

## 2014-04-08 LAB — CBC
HEMATOCRIT: 33.4 % — AB (ref 39.0–52.0)
Hemoglobin: 11.4 g/dL — ABNORMAL LOW (ref 13.0–17.0)
MCH: 32.8 pg (ref 26.0–34.0)
MCHC: 34.1 g/dL (ref 30.0–36.0)
MCV: 96 fL (ref 78.0–100.0)
Platelets: 151 10*3/uL (ref 150–400)
RBC: 3.48 MIL/uL — ABNORMAL LOW (ref 4.22–5.81)
RDW: 12.8 % (ref 11.5–15.5)
WBC: 9.6 10*3/uL (ref 4.0–10.5)

## 2014-04-08 LAB — I-STAT CG4 LACTIC ACID, ED: LACTIC ACID, VENOUS: 3.27 mmol/L — AB (ref 0.5–2.0)

## 2014-04-08 LAB — URINE MICROSCOPIC-ADD ON

## 2014-04-08 LAB — ETHANOL: Alcohol, Ethyl (B): <5 mg/dL (ref 0–9)

## 2014-04-08 LAB — LIPASE, BLOOD: Lipase: 22 U/L (ref 11–59)

## 2014-04-08 LAB — TSH: TSH: 0.57 u[IU]/mL (ref 0.350–4.500)

## 2014-04-08 LAB — ACETAMINOPHEN LEVEL: Acetaminophen (Tylenol), Serum: <10 ug/mL — ABNORMAL LOW (ref 10–30)

## 2014-04-08 LAB — CBG MONITORING, ED
Glucose-Capillary: 95 mg/dL (ref 70–99)
Glucose-Capillary: 120 mg/dL — ABNORMAL HIGH (ref 70–99)

## 2014-04-08 LAB — TROPONIN I: Troponin I: <0.03 ng/mL (ref <0.031)

## 2014-04-08 LAB — MAGNESIUM: Magnesium: 1.6 mg/dL (ref 1.5–2.5)

## 2014-04-08 MED ORDER — ONDANSETRON HCL 4 MG/2ML IJ SOLN
4.0000 mg | Freq: Once | INTRAMUSCULAR | Status: AC
  Administered 2014-04-08: 4 mg via INTRAVENOUS
  Filled 2014-04-08: qty 2

## 2014-04-08 MED ORDER — SODIUM CHLORIDE 0.9 % IJ SOLN
3.0000 mL | Freq: Two times a day (BID) | INTRAMUSCULAR | Status: DC
  Administered 2014-04-09 – 2014-04-10 (×3): 3 mL via INTRAVENOUS

## 2014-04-08 MED ORDER — ZOLPIDEM TARTRATE 10 MG PO TABS
10.0000 mg | ORAL_TABLET | Freq: Once | ORAL | Status: DC
  Filled 2014-04-08: qty 1

## 2014-04-08 MED ORDER — FOLIC ACID 1 MG PO TABS
1.0000 mg | ORAL_TABLET | Freq: Every day | ORAL | Status: DC
  Administered 2014-04-08 – 2014-04-10 (×3): 1 mg via ORAL
  Filled 2014-04-08 (×3): qty 1

## 2014-04-08 MED ORDER — DEXTROSE 50 % IV SOLN
1.0000 | Freq: Once | INTRAVENOUS | Status: AC
  Administered 2014-04-08: 50 mL via INTRAVENOUS

## 2014-04-08 MED ORDER — LEVALBUTEROL HCL 0.63 MG/3ML IN NEBU: 0.6300 mg | INHALATION_SOLUTION | Freq: Four times a day (QID) | RESPIRATORY_TRACT | Status: DC | PRN

## 2014-04-08 MED ORDER — IOHEXOL 300 MG/ML  SOLN
100.0000 mL | Freq: Once | INTRAMUSCULAR | Status: AC | PRN
  Administered 2014-04-08: 100 mL via INTRAVENOUS

## 2014-04-08 MED ORDER — LORAZEPAM 2 MG/ML IJ SOLN: 0.0000 mg | Freq: Two times a day (BID) | INTRAMUSCULAR | Status: DC

## 2014-04-08 MED ORDER — LORAZEPAM 1 MG PO TABS: 1.0000 mg | ORAL_TABLET | Freq: Four times a day (QID) | ORAL | Status: DC | PRN

## 2014-04-08 MED ORDER — SODIUM BICARBONATE 8.4 % IV SOLN
INTRAVENOUS | Status: DC
  Administered 2014-04-08 – 2014-04-09 (×3): via INTRAVENOUS
  Filled 2014-04-08 (×6): qty 150

## 2014-04-08 MED ORDER — INSULIN ASPART 100 UNIT/ML ~~LOC~~ SOLN
0.0000 [IU] | Freq: Three times a day (TID) | SUBCUTANEOUS | Status: DC
  Administered 2014-04-09: 1 [IU] via SUBCUTANEOUS

## 2014-04-08 MED ORDER — INFLUENZA VAC SPLIT QUAD 0.5 ML IM SUSY
0.5000 mL | PREFILLED_SYRINGE | INTRAMUSCULAR | Status: DC
  Filled 2014-04-08 (×2): qty 0.5

## 2014-04-08 MED ORDER — ONDANSETRON HCL 4 MG/2ML IJ SOLN: 4.0000 mg | Freq: Three times a day (TID) | INTRAMUSCULAR | Status: AC | PRN

## 2014-04-08 MED ORDER — PANTOPRAZOLE SODIUM 40 MG IV SOLR
40.0000 mg | Freq: Two times a day (BID) | INTRAVENOUS | Status: DC
  Administered 2014-04-08 – 2014-04-09 (×2): 40 mg via INTRAVENOUS
  Filled 2014-04-08 (×2): qty 40

## 2014-04-08 MED ORDER — VITAMIN B-1 100 MG PO TABS
100.0000 mg | ORAL_TABLET | Freq: Every day | ORAL | Status: DC
  Administered 2014-04-08 – 2014-04-10 (×3): 100 mg via ORAL
  Filled 2014-04-08 (×3): qty 1

## 2014-04-08 MED ORDER — DEXTROSE 50 % IV SOLN
INTRAVENOUS | Status: AC
  Filled 2014-04-08: qty 50

## 2014-04-08 MED ORDER — SODIUM CHLORIDE 0.9 % IV BOLUS (SEPSIS)
1000.0000 mL | Freq: Once | INTRAVENOUS | Status: AC
  Administered 2014-04-08: 1000 mL via INTRAVENOUS

## 2014-04-08 MED ORDER — ADULT MULTIVITAMIN W/MINERALS CH
1.0000 | ORAL_TABLET | Freq: Every day | ORAL | Status: DC
Start: 2014-04-08 — End: 2014-04-10
  Administered 2014-04-08 – 2014-04-10 (×3): 1 via ORAL
  Filled 2014-04-08 (×3): qty 1

## 2014-04-08 MED ORDER — LORAZEPAM 2 MG/ML IJ SOLN: 1.0000 mg | Freq: Four times a day (QID) | INTRAMUSCULAR | Status: DC | PRN

## 2014-04-08 MED ORDER — THIAMINE HCL 100 MG/ML IJ SOLN: 100.0000 mg | Freq: Every day | INTRAMUSCULAR | Status: DC

## 2014-04-08 MED ORDER — SODIUM CHLORIDE 0.9 % IV SOLN
INTRAVENOUS | Status: DC
  Administered 2014-04-08: 20:00:00 via INTRAVENOUS

## 2014-04-08 MED ORDER — LORAZEPAM 2 MG/ML IJ SOLN: 0.0000 mg | Freq: Four times a day (QID) | INTRAMUSCULAR | Status: DC

## 2014-04-08 MED ORDER — ENOXAPARIN SODIUM 40 MG/0.4ML ~~LOC~~ SOLN
40.0000 mg | SUBCUTANEOUS | Status: DC
  Administered 2014-04-08: 40 mg via SUBCUTANEOUS
  Filled 2014-04-08 (×2): qty 0.4

## 2014-04-08 MED ORDER — IOHEXOL 300 MG/ML  SOLN
50.0000 mL | Freq: Once | INTRAMUSCULAR | Status: AC | PRN
  Administered 2014-04-08: 50 mL via ORAL

## 2014-04-08 NOTE — ED Notes (Signed)
Pt stated that he felt dizzy upon standing.

## 2014-04-08 NOTE — ED Notes (Addendum)
Pt began to have emesis at 7am today. Hx of hypoglycemic episodes, but unsure if he has been diagnosed with diabetes before. No diarrhea, some abd pain that began after throwing. Reports stress at home and has drinking etoh. Last drink beer at 10pm last night. CBG 57 in triage. Unable to keep fluids down

## 2014-04-08 NOTE — ED Provider Notes (Signed)
CSN: [id number]     Arrival date & time 04/08/14  57 History   First MD Initiated Contact with Patient 04/08/14 1140     Chief Complaint  Patient presents with  . Emesis  . Hypoglycemia   HPI  Patient is a 34 y.o. Male with PMH of alcohol abuse, recurrent hypoglycemia, and GERD who presents to the ED with 1 day history of nausea vomiting and hypoglycemia. Patient states that this morning he woke up and was having severe nausea and vomiting. He has vomited up food product and has now progressed to dry heaving. He denies any bilious emesis or hematemesis. Patient states that he has been feeling weak and worn out for the past 3 days. He also admits that he has been under a significant amount of stress and has been drinking more than normal. He states that this may have something to do with his drinking. Patient has not followed by a PCP. He is unaware of whether he has diabetes not. He states he has never been evaluated for it. He does have episodes of recurrent hypoglycemia.Patient denies fevers, chills, melena, medications, urinary symptoms, abdominal pain, diarrhea.  Past Medical History  Diagnosis Date  . GERD (gastroesophageal reflux disease)   . H/O hiatal hernia     pt states he had an EGD at age 69 and had a hiatal hernia  . Dysphagia   . Diverticulitis of colon with bleeding 2010    Hospitalized in 2010 for "intestinal infection requiring IV antibiotics"  . PTSD (post-traumatic stress disorder)   . Alcohol abuse   . Hypoglycemia     recurrent  . Elevated LFTs    Past Surgical History  Procedure Laterality Date  . Tonsillectomy    . Esophagogastroduodenoscopy      age 47  . Colonoscopy  05/15/2011    Procedure: COLONOSCOPY;  Surgeon: Irene Shipper, MD;  Location: [place];  Service: Endoscopy;  Laterality: N/A;  . Esophagogastroduodenoscopy  05/15/2011    Procedure: ESOPHAGOGASTRODUODENOSCOPY (EGD);  Surgeon: Irene Shipper, MD;  Location: [facility] ENDOSCOPY;  Service: Endoscopy;   Laterality: N/A;  . Givens capsule study  05/30/2011    Procedure: GIVENS CAPSULE STUDY;  Surgeon: Irene Shipper, MD;  Location: [place];  Service: Endoscopy;  Laterality: N/A;  Givens Capsule study.   Family History  Problem Relation Age of Onset  . Diabetes type II Mother   . Emphysema Father   . Heart attack Father   . Stroke Father   . Diabetes type II Other   . Diabetes type II Maternal Aunt    History  Substance Use Topics  . Smoking status: Never Smoker   . Smokeless tobacco: Current User    Types: Chew     Comment: States he has been chewing tobacco almost daily since age 62  . Alcohol Use: 1.8 oz/week    3 Cans of beer per week     Comment: average six 12oz beers/day, ranges from 3-12 12oz/day    Review of Systems  Constitutional: Negative for fever, chills and fatigue.  Respiratory: Negative for cough, chest tightness and shortness of breath.   Cardiovascular: Negative for chest pain and palpitations.  Gastrointestinal: Positive for nausea and vomiting. Negative for abdominal pain, diarrhea, constipation, blood in stool and anal bleeding.  Genitourinary: Negative for dysuria, urgency, frequency, hematuria and difficulty urinating.  Neurological: Positive for dizziness and light-headedness.  All other systems reviewed and are negative.     Allergies  Review of  patient's allergies indicates no known allergies.  Home Medications   Prior to Admission medications   Medication Sig Start Date End Date Taking? Authorizing Provider  ibuprofen (ADVIL,MOTRIN) 200 MG tablet Take 400 mg by mouth every 6 (six) hours as needed for headache or moderate pain.   Yes Historical Provider, MD  ranitidine (ZANTAC) 150 MG tablet Take 150 mg by mouth 2 (two) times daily as needed for heartburn.   Yes Historical Provider, MD  erythromycin ophthalmic ointment Place a 1/2 inch ribbon of ointment into the lower eyelid.  Apply four times daily.  Use for 14 days. Patient not taking:  Reported on 04/08/2014 02/21/14   [name], PA-C  folic acid (FOLVITE) 1 MG tablet Take 1 tablet (1 mg total) by mouth daily. Patient not taking: Reported on 04/08/2014 08/27/13   [name], MD  thiamine 100 MG tablet Take 1 tablet (100 mg total) by mouth daily. Patient not taking: Reported on 04/08/2014 08/27/13   [name], MD   BP 134/75 mmHg  Pulse 103  Temp(Src) 98 F (36.7 C) (Oral)  Resp 16  SpO2 98% Physical Exam  Constitutional: He is oriented to person, place, and time. He appears well-developed and well-nourished. No distress.  HENT:  Head: Normocephalic and atraumatic.  Mouth/Throat: No oropharyngeal exudate.  Oropharynx is dry  Eyes: Conjunctivae and EOM are normal. Pupils are equal, round, and reactive to light. No scleral icterus.  Neck: Normal range of motion. Neck supple. No JVD present. No thyromegaly present.  Cardiovascular: Normal rate, regular rhythm, normal heart sounds and intact distal pulses.  Exam reveals no gallop and no friction rub.   No murmur heard. Pulmonary/Chest: Effort normal and breath sounds normal. No respiratory distress. He has no wheezes. He has no rales. He exhibits no tenderness.  Abdominal: Soft. Bowel sounds are normal. He exhibits no distension and no mass. There is no tenderness. There is no rebound and no guarding.  Musculoskeletal: Normal range of motion.  Lymphadenopathy:    He has no cervical adenopathy.  Neurological: He is alert and oriented to person, place, and time. He has normal strength. No cranial nerve deficit or sensory deficit. Coordination normal.  Skin: Skin is warm and dry. He is not diaphoretic.  Psychiatric: He has a normal mood and affect. His behavior is normal. Judgment and thought content normal.  Nursing note and vitals reviewed.   ED Course  Procedures (including critical care time) Labs Review Labs Reviewed  CBC WITH DIFFERENTIAL/PLATELET - Abnormal; Notable for the following:    WBC 16.4 (*)     Neutrophils Relative % 92 (*)    Neutro Abs 15.0 (*)    Lymphocytes Relative 3 (*)    Lymphs Abs 0.6 (*)    All other components within normal limits  COMPREHENSIVE METABOLIC PANEL - Abnormal; Notable for the following:    CO2 16 (*)    Albumin 5.6 (*)    AST 117 (*)    ALT 78 (*)    GFR calc non Af Amer 84 (*)    Anion gap 26 (*)    All other components within normal limits  URINALYSIS, ROUTINE W REFLEX MICROSCOPIC - Abnormal; Notable for the following:    Glucose, UA >1000 (*)    Hgb urine dipstick TRACE (*)    Ketones, ur >80 (*)    All other components within normal limits  URINE MICROSCOPIC-ADD ON - Abnormal; Notable for the following:    Bacteria, UA FEW (*)  All other components within normal limits  BLOOD GAS, VENOUS - Abnormal; Notable for the following:    Bicarbonate 18.8 (*)    Acid-base deficit 6.7 (*)    All other components within normal limits  I-STAT CG4 LACTIC ACID, ED - Abnormal; Notable for the following:    Lactic Acid, Venous 3.27 (*)    All other components within normal limits  LIPASE, BLOOD  CBG MONITORING, ED  CBG MONITORING, ED    Imaging Review No results found.   EKG Interpretation None      MDM   Final diagnoses:  Metabolic acidosis  Non-intractable vomiting with nausea, vomiting of unspecified type  Dehydration  Alcohol abuse   Patient is a 34 year old male who presents emergency room for evaluation of nausea and vomiting. On initial examination patient is mildly tachycardic and has dry mucous membranes. Patient was mildly hypoglycemic with a blood sugar of 57 on arrival. Patient has no history of diabetes that he is aware of. Patient does have recurrent hypoglycemia. Patient also is a heavy drinker. CBC reveals mild leukocytosis. CMP reveals decreased bicarbonate with an anion gapand mildly elevated LFTs.. UA reveals glucose spilling and some ketones.  i-STAT lactic acid is elevated at 3.27. Lipase is negative. CBG after D50  administration was 97. Blood sugar is normal. Patient has multifactorial causes which entered into the differential for metabolic acidosis including alcohol use, possible lactic acidosis, or DKA. Although blood sugars are normal. Patient given 1 L normal saline bolus. After 1 L patient remained orthostatic. Will start a second liter of fluid and will call the hospitalist for admission due to metabolic derangement.I spoken with the hospitalist who will admit the patient to the telemetry floor. Temporary admit orders have been placed. Will order CT abdomen and pelvis per their recommendation.patient seen by and discussed with Dr. Mingo Amber who agrees the above workup and plan.    Cherylann Parr, PA-C 04/08/14 [place], MD 04/09/14 [phone]

## 2014-04-08 NOTE — ED Notes (Signed)
Spoke with Samuel [name] to confirm labs orderded.Marland Kitchenklj

## 2014-04-08 NOTE — ED Notes (Signed)
Elevated lactic acid given to Dr Mingo Amber...klj

## 2014-04-08 NOTE — ED Notes (Signed)
Patient transported to CT 

## 2014-04-08 NOTE — H&P (Signed)
[facility] History and Physical  Samuel [name] [id number] DOB: Oct 21, 1980 DOA: 04/08/2014  Referring physician:  PCP: Default, Provider, MD   Chief Complaint:    HPI:  34 y.o. Male with PMH of alcohol abuse, recurrent hypoglycemia, and GERD who presents to the ED with 1 day history of nausea vomiting and hypoglycemia with a CBG of 57. Patient states that this morning he woke up and was having severe nausea and vomiting. Patient has had several episodes of vomiting since 7 AM today. He denies any  hematemesis. Patient states that he has been feeling weak and worn out for the past 3 days. He also admits that he has been under a significant amount of stress and has been drinking more than normal. Patient does not have a PCP . Patient denies fevers, chills, melena, medications, urinary symptoms, abdominal pain, diarrhea  On initial examination patient is mildly tachycardic and has dry mucous membranes.   Patient has no history of diabetes that he is aware of.  Workup in the ER shows CBC reveals mild leukocytosis. CMP reveals decreased bicarbonate with an anion gap of 26 and mildly elevated LFTs.. UA reveals glucose spilling and some ketones. i-STAT lactic acid is elevated at 3.27. Lipase is negative. CBG after D50 administration was 97   Review of Systems: negative for the following  Constitutional: Denies fever, chills, diaphoresis, appetite change and fatigue.  HEENT: Denies photophobia, eye pain, redness, hearing loss, ear pain, congestion, sore throat, rhinorrhea, sneezing, mouth sores, trouble swallowing, neck pain, neck stiffness and tinnitus.  Respiratory: Denies SOB, DOE, cough, chest tightness, and wheezing.  Cardiovascular: Denies chest pain, palpitations and leg swelling.  Gastrointestinal: Positive for nausea and vomiting. Negative for abdominal pain, diarrhea, constipation, blood in stool and anal bleeding.  Genitourinary: Negative for dysuria, urgency, frequency, hematuria  and difficulty urinatingMusculoskeletal: Denies myalgias, back pain, joint swelling, arthralgias and gait problem.  Skin: Denies pallor, rash and wound.  Neurological: Positive for dizziness and light-headedness. , syncope, weakness, light-headedness, numbness and headaches.  Hematological: Denies adenopathy. Easy bruising, personal or family bleeding history  Psychiatric/Behavioral: Denies suicidal ideation, mood changes, confusion, nervousness, sleep disturbance and agitation       Past Medical History  Diagnosis Date  . GERD (gastroesophageal reflux disease)   . H/O hiatal hernia     pt states he had an EGD at age 49 and had a hiatal hernia  . Dysphagia   . Diverticulitis of colon with bleeding 2010    Hospitalized in 2010 for "intestinal infection requiring IV antibiotics"  . PTSD (post-traumatic stress disorder)   . Alcohol abuse   . Hypoglycemia     recurrent  . Elevated LFTs      Past Surgical History  Procedure Laterality Date  . Tonsillectomy    . Esophagogastroduodenoscopy      age 83  . Colonoscopy  05/15/2011    Procedure: COLONOSCOPY;  Surgeon: Irene Shipper, MD;  Location: [place];  Service: Endoscopy;  Laterality: N/A;  . Esophagogastroduodenoscopy  05/15/2011    Procedure: ESOPHAGOGASTRODUODENOSCOPY (EGD);  Surgeon: Irene Shipper, MD;  Location[facility] ENDOSCOPY;  Service: Endoscopy;  Laterality: N/A;  . Givens capsule study  05/30/2011    Procedure: GIVENS CAPSULE STUDY;  Surgeon: Irene Shipper, MD;  Location: [place];  Service: Endoscopy;  Laterality: N/A;  Givens Capsule study.      Social History:  reports that he has never smoked. His smokeless tobacco use includes Chew. He reports that he drinks about 1.8  oz of alcohol per week. He reports that he does not use illicit drugs.    No Known Allergies  Family History  Problem Relation Age of Onset  . Diabetes type II Mother   . Emphysema Father   . Heart attack Father   . Stroke Father   . Diabetes  type II Other   . Diabetes type II Maternal Aunt          Prior to Admission medications   Medication Sig Start Date End Date Taking? Authorizing Provider  ibuprofen (ADVIL,MOTRIN) 200 MG tablet Take 400 mg by mouth every 6 (six) hours as needed for headache or moderate pain.   Yes Historical Provider, MD  ranitidine (ZANTAC) 150 MG tablet Take 150 mg by mouth 2 (two) times daily as needed for heartburn.   Yes Historical Provider, MD  erythromycin ophthalmic ointment Place a 1/2 inch ribbon of ointment into the lower eyelid.  Apply four times daily.  Use for 14 days. Patient not taking: Reported on 04/08/2014 02/21/14   [name], PA-C  folic acid (FOLVITE) 1 MG tablet Take 1 tablet (1 mg total) by mouth daily. Patient not taking: Reported on 04/08/2014 08/27/13   [name], MD  thiamine 100 MG tablet Take 1 tablet (100 mg total) by mouth daily. Patient not taking: Reported on 04/08/2014 08/27/13   [name], MD     Physical Exam: Filed Vitals:   04/08/14 1148 04/08/14 1356 04/08/14 1608 04/08/14 1700  BP: 138/84 126/75 134/75 122/78  Pulse: 120 110 103 106  Temp: 98 F (36.7 C)     TempSrc: Oral     Resp: 20 16 16    SpO2: 100% 97% 98% 98%     Constitutional: Vital signs reviewed. Patient is a well-developed and well-nourished in no acute distress and cooperative with exam. Alert and oriented x3.  Head: Normocephalic and atraumatic  Ear: TM normal bilaterally  Mouth: no erythema or exudates, dry mucous membranes  Eyes: PERRL, EOMI, conjunctivae normal, No scleral icterus.  Neck: Supple, Trachea midline normal ROM, No JVD, mass, thyromegaly, or carotid bruit present.  Cardiovascular: RRR, S1 normal, S2 normal, no MRG, pulses symmetric and intact bilaterally  Pulmonary/Chest: CTAB, no wheezes, rales, or rhonchi  Abdominal: Soft. Non-tender, non-distended, bowel sounds are normal, no masses, organomegaly, or guarding present.  GU: no CVA tenderness Musculoskeletal: No joint  deformities, erythema, or stiffness, ROM full and no nontender Ext: no edema and no cyanosis, pulses palpable bilaterally (DP and PT)  Hematology: no cervical, inginal, or axillary adenopathy.  Neurological: A&O x3, Strenght is normal and symmetric bilaterally, cranial nerve II-XII are grossly intact, no focal motor deficit, sensory intact to light touch bilaterally.  Skin: Warm, dry and intact. No rash, cyanosis, or clubbing.  Psychiatric: Normal mood and affect. speech and behavior is normal. Judgment and thought content normal. Cognition and memory are normal.       Labs on Admission:    Basic Metabolic Panel:  Recent Labs Lab 04/08/14 1330  NA 140  K 4.8  CL 98  CO2 16*  GLUCOSE 70  BUN 15  CREATININE 1.12  CALCIUM 9.2   Liver Function Tests:  Recent Labs Lab 04/08/14 1330  AST 117*  ALT 78*  ALKPHOS 57  BILITOT 1.2  PROT 8.1  ALBUMIN 5.6*    Recent Labs Lab 04/08/14 1330  LIPASE 22   No results for input(s): AMMONIA in the last 168 hours. CBC:  Recent Labs Lab 04/08/14 1330  WBC 16.4*  NEUTROABS 15.0*  HGB 15.1  HCT 44.4  MCV 97.2  PLT 201   Cardiac Enzymes: No results for input(s): CKTOTAL, CKMB, CKMBINDEX, TROPONINI in the last 168 hours.  BNP (last 3 results) No results for input(s): BNP in the last 8760 hours.  ProBNP (last 3 results) No results for input(s): PROBNP in the last 8760 hours.     CBG:  Recent Labs Lab 04/08/14 1550 04/08/14 1706  GLUCAP 95 120*    Radiological Exams on Admission: Dg Chest 2 View  04/08/2014   CLINICAL DATA:  Acute onset of severe nausea and vomiting. Recurrent hypoglycemia. Initial encounter.  EXAM: CHEST  2 VIEW  COMPARISON:  Chest radiograph performed 03/02/2013  FINDINGS: The lungs are well-aerated and clear. There is no evidence of focal opacification, pleural effusion or pneumothorax.  The heart is normal in size; the mediastinal contour is within normal limits. No acute osseous abnormalities  are seen. The stomach contains a small to moderate amount of fluid and air.  IMPRESSION: No acute cardiopulmonary process seen.   Electronically Signed   By: Garald Balding M.D.   On: 04/08/2014 17:29    EKG: Independently reviewed.  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   Code Status:   full Family Communication: bedside Disposition Plan: admit   Time spent: 70 mins   [facility] Pager [phone]  If 7PM-7AM, please contact night-coverage [email or web address] Password [facility] 04/08/2014, 5:48 PM

## 2014-04-08 NOTE — Progress Notes (Signed)
  CARE MANAGEMENT ED NOTE 04/08/2014  Patient:  Samuel [name], Samuel [name]   Account Number:  [id number]  Date Initiated:  04/08/2014  Documentation initiated by:  Livia Snellen  Subjective/Objective Assessment:   Patient presents to Ed with increased weakness, nausea and vomiting     Subjective/Objective Assessment Detail:   Patient with pmhx of GERD, dysphagia, diverticulitis, alcohol abuse, hypoglycemia, elevated LFT's     Action/Plan:   Action/Plan Detail:   Anticipated DC Date:       Status Recommendation to Physician:   Result of Recommendation:    Other ED [place]  Other  PCP issues    Choice offered to / List presented to:            Status of service:  Completed, signed off  ED Comments:   ED Comments Detail:  EDCMspoke to patient at bedside.  Patient confirms he does nto have insurnace or a pcp living in [facility]. EDCM provided patient with list of free clinics in [place] such as the [facility], financial resources in [place] and phone number for [facility] [employer].  EDCM also provided  patient with pamphlet to [facility], informed patient of services there and walk in times.  EDCM also provided patient with list of pcps who accept self pay patients, list of discount pharmacies and websites needymeds.org and GoodRX.com for medication assistance, phone number to inquire about the orange card, phone number to inquire about [facility], phone number to inquire about the [facility], financial resources in the community such as local churches, [employer], urban ministries, and dental assistance for uninsured patients. Patient thankful for resources.  No further EDCM needs at this time.

## 2014-04-08 NOTE — ED Notes (Signed)
PA at bedside.

## 2014-04-09 DIAGNOSIS — K219 Gastro-esophageal reflux disease without esophagitis: Secondary | ICD-10-CM

## 2014-04-09 DIAGNOSIS — R1110 Vomiting, unspecified: Secondary | ICD-10-CM

## 2014-04-09 DIAGNOSIS — F1010 Alcohol abuse, uncomplicated: Secondary | ICD-10-CM

## 2014-04-09 LAB — OSMOLALITY: Osmolality: 290 mOsm/kg (ref 275–300)

## 2014-04-09 LAB — HEPATITIS PANEL, ACUTE
HCV Ab: NEGATIVE
HEP B C IGM: NONREACTIVE
HEP B S AG: NEGATIVE
Hep A IgM: NONREACTIVE

## 2014-04-09 LAB — BLOOD GAS, VENOUS
Acid-base deficit: 6.7 mmol/L — ABNORMAL HIGH (ref 0.0–2.0)
Bicarbonate: 18.8 mEq/L — ABNORMAL LOW (ref 20.0–24.0)
FIO2: 0.21 %
O2 SAT: 64.9 %
PATIENT TEMPERATURE: 98.6
PCO2 VEN: 39.6 mmHg — AB (ref 45.0–50.0)
PH VEN: 7.299 (ref 7.250–7.300)
TCO2: 17.4 mmol/L (ref 0–100)

## 2014-04-09 LAB — GLUCOSE, CAPILLARY
Glucose-Capillary: 145 mg/dL — ABNORMAL HIGH (ref 70–99)
Glucose-Capillary: 131 mg/dL — ABNORMAL HIGH (ref 70–99)
Glucose-Capillary: 116 mg/dL — ABNORMAL HIGH (ref 70–99)
Glucose-Capillary: 99 mg/dL (ref 70–99)
Glucose-Capillary: 157 mg/dL — ABNORMAL HIGH (ref 70–99)

## 2014-04-09 MED ORDER — PANTOPRAZOLE SODIUM 40 MG PO TBEC
40.0000 mg | DELAYED_RELEASE_TABLET | Freq: Every day | ORAL | Status: DC
  Administered 2014-04-09 – 2014-04-10 (×2): 40 mg via ORAL
  Filled 2014-04-09 (×2): qty 1

## 2014-04-09 MED ORDER — FAMOTIDINE 20 MG PO TABS
10.0000 mg | ORAL_TABLET | Freq: Every day | ORAL | Status: DC
  Administered 2014-04-09 – 2014-04-10 (×2): 10 mg via ORAL
  Filled 2014-04-09 (×2): qty 1

## 2014-04-09 MED ORDER — SODIUM CHLORIDE 0.9 % IV SOLN
INTRAVENOUS | Status: DC
  Administered 2014-04-09: 11:00:00 via INTRAVENOUS

## 2014-04-09 MED ORDER — BOOST / RESOURCE BREEZE PO LIQD
1.0000 | Freq: Three times a day (TID) | ORAL | Status: DC
  Administered 2014-04-09 – 2014-04-10 (×2): 1 via ORAL

## 2014-04-09 NOTE — Progress Notes (Signed)
INITIAL NUTRITION ASSESSMENT  DOCUMENTATION CODES Per approved criteria  -Underweight   INTERVENTION: - Recommend diet advancement to Carbohydrate Modified Diet with pm snack - Resource Breeze po TID, each supplement provides 250 kcal and 9 grams of protein - Discussed ways to avoid hypoglycemia and snacks to have before bed.   NUTRITION DIAGNOSIS: Underweight related to etoh abuse as evidenced by BMI of 16.9.   Goal: Pt to meet >/= 90% of their estimated nutrition needs   Monitor:  Weight trend, po intake, acceptance of supplements, labs  Reason for Assessment: Malnutrition Screening Tool  34 y.o. male  Admitting Dx: <principal problem not specified>  ASSESSMENT: 34 y.o. Male with PMH of alcohol abuse, recurrent hypoglycemia, and GERD who presents to the ED with 1 day history of nausea vomiting and hypoglycemia with a CBG of 57. Patient states that this morning he woke up and was having severe nausea and vomiting.  - Pt reports that his usual body weight has always been low and around 118-120 lbs. Both of his parents are very thin.  - Pt reports that he sometimes has episodes of low blood glucose which he can feel in the mornings. Discussed snack before bedtime and keeping snacks containing carbohydrate close by his bed.  - Pt with signs of fat and muscle wasting, but do not feel malnutrition diagnosis is appropriate based on family history and pt's weight history. He reports that he normally eats well.  - Pt is currently hungry and wants his diet to be upgraded.   Labs reviewed  Height: Ht Readings from Last 1 Encounters:  04/08/14 5\' 9"  (1.753 m)    Weight: Wt Readings from Last 1 Encounters:  04/08/14 114 lb (51.71 kg)    Ideal Body Weight: 70.7 kg  % Ideal Body Weight: 73%  Wt Readings from Last 10 Encounters:  04/08/14 114 lb (51.71 kg)  02/21/14 120 lb (54.432 kg)  08/26/13 121 lb (54.885 kg)  03/03/13 112 lb 3.4 oz (50.9 kg)  06/01/12 115 lb 2 oz (52.22  kg)  05/29/12 117 lb 3.2 oz (53.162 kg)  05/30/11 120 lb (54.432 kg)  05/13/11 123 lb 7.3 oz (56 kg)   BMI:  Body mass index is 16.83 kg/(m^2).  Estimated Nutritional Needs: Kcal: 1600-1800 Protein: 80-90 g Fluid: 1.8 L/day  Skin: intact  Diet Order: Diet clear liquid  EDUCATION NEEDS: -Education needs addressed   Intake/Output Summary (Last 24 hours) at 04/09/14 1215 Last data filed at 04/09/14 1022  Gross per 24 hour  Intake 1758.33 ml  Output   1750 ml  Net   8.33 ml    Last BM: prior to admission   Labs:   Recent Labs Lab 04/08/14 1330 04/08/14 1747  NA 140 135  K 4.8 4.3  CL 98 103  CO2 16* 21  BUN 15 11  CREATININE 1.12 0.86  CALCIUM 9.2 7.6*  MG  --  1.6  GLUCOSE 70 120*    CBG (last 3)   Recent Labs  04/09/14 0421 04/09/14 0802 04/09/14 1201  GLUCAP 145* 131* 116*    Scheduled Meds: . enoxaparin (LOVENOX) injection  40 mg Subcutaneous Q24H  . folic acid  1 mg Oral Daily  . Influenza vac split quadrivalent PF  0.5 mL Intramuscular Tomorrow-1000  . insulin aspart  0-9 Units Subcutaneous TID WC  . LORazepam  0-4 mg Intravenous Q6H   Followed by  . [START ON 04/10/2014] LORazepam  0-4 mg Intravenous Q12H  . multivitamin with minerals  1 tablet Oral Daily  . pantoprazole (PROTONIX) IV  40 mg Intravenous Q12H  . sodium chloride  3 mL Intravenous Q12H  . thiamine  100 mg Oral Daily   Or  . thiamine  100 mg Intravenous Daily  . zolpidem  10 mg Oral Once    Continuous Infusions: . sodium chloride 50 mL/hr at 04/09/14 1040  .  sodium bicarbonate  infusion 1000 mL 100 mL/hr at 04/09/14 1042    Past Medical History  Diagnosis Date  . GERD (gastroesophageal reflux disease)   . H/O hiatal hernia     pt states he had an EGD at age 29 and had a hiatal hernia  . Dysphagia   . Diverticulitis of colon with bleeding 2010    Hospitalized in 2010 for "intestinal infection requiring IV antibiotics"  . PTSD (post-traumatic stress disorder)   .  Alcohol abuse   . Hypoglycemia     recurrent  . Elevated LFTs     Past Surgical History  Procedure Laterality Date  . Tonsillectomy    . Esophagogastroduodenoscopy      age 29  . Colonoscopy  05/15/2011    Procedure: COLONOSCOPY;  Surgeon: Irene Shipper, MD;  Location: [place];  Service: Endoscopy;  Laterality: N/A;  . Esophagogastroduodenoscopy  05/15/2011    Procedure: ESOPHAGOGASTRODUODENOSCOPY (EGD);  Surgeon: Irene Shipper, MD;  Location: [facility] ENDOSCOPY;  Service: Endoscopy;  Laterality: N/A;  . Givens capsule study  05/30/2011    Procedure: GIVENS CAPSULE STUDY;  Surgeon: Irene Shipper, MD;  Location: [place];  Service: Endoscopy;  Laterality: N/A;  Givens Capsule study.    Laurette Schimke MS, RD, LDN

## 2014-04-09 NOTE — Progress Notes (Signed)
Samuel [name] FGH:[id number] DOB: [birth year] DOA: 04/08/2014 PCP: Default, Provider, MD  Brief narrative: 34 year old male admitted 08/12/87 with metabolic acidosis, hypoglycemia, alcohol abuse disorder and ethanolism  Past medical history-As per Problem list Chart reviewed as below- [place]  Consultants:  None  Procedures:  None  Antibiotics:  None   Subjective  Doing well Tolerated clears then full liquids and now eating a full diet No other issues   Objective    Interim History: None  Telemetry: Sinus   Objective: Filed Vitals:   04/08/14 1821 04/08/14 2053 04/09/14 0429 04/09/14 1422  BP: 122/70 109/63 96/64 100/63  Pulse: 103 97 82 88  Temp: 98.7 F (37.1 C) 99.1 F (37.3 C) 98.2 F (36.8 C) 98.6 F (37 C)  TempSrc: Oral Oral Oral Oral  Resp: 20 20 20 18   Height: 5\' 9"  (1.753 m)     Weight: 51.71 kg (114 lb)     SpO2: 99% 98% 98% 96%    Intake/Output Summary (Last 24 hours) at 04/09/14 1852 Last data filed at 04/09/14 1700  Gross per 24 hour  Intake   3735 ml  Output   2050 ml  Net   1685 ml    Exam:  General: EOMI NCAT Cardiovascular: S1-S2 no murmur rub or gallop Respiratory: Clinically clear Abdomen: Soft nontender no rebound Skin no lower extremity edema Neuro intact over all 4 limbs equally ambulating well  Data Reviewed: Basic Metabolic Panel:  Recent Labs Lab 04/08/14 1330 04/08/14 1747  NA 140 135  K 4.8 4.3  CL 98 103  CO2 16* 21  GLUCOSE 70 120*  BUN 15 11  CREATININE 1.12 0.86  CALCIUM 9.2 7.6*  MG  --  1.6   Liver Function Tests:  Recent Labs Lab 04/08/14 1330 04/08/14 1747  AST 117* 72*  ALT 78* 54*  ALKPHOS 57 35*  BILITOT 1.2 0.9  PROT 8.1 5.7*  ALBUMIN 5.6* 3.7    Recent Labs Lab 04/08/14 1330  LIPASE 22   No results for input(s): AMMONIA in the last 168 hours. CBC:  Recent Labs Lab 04/08/14 1330 04/08/14 1747  WBC 16.4* 9.6  NEUTROABS 15.0*  --   HGB 15.1 11.4*  HCT 44.4 33.4*    MCV 97.2 96.0  PLT 201 151   Cardiac Enzymes:  Recent Labs Lab 04/08/14 1747  TROPONINI <0.03   BNP: Invalid input(s): POCBNP CBG:  Recent Labs Lab 04/08/14 2052 04/09/14 0421 04/09/14 0802 04/09/14 1201 04/09/14 1637  GLUCAP 164* 145* 131* 116* 99    No results found for this or any previous visit (from the past 240 hour(s)).   Studies:              All Imaging reviewed and is as per above notation   Scheduled Meds: . enoxaparin (LOVENOX) injection  40 mg Subcutaneous Q24H  . famotidine  10 mg Oral Daily  . feeding supplement (RESOURCE BREEZE)  1 Container Oral TID BM  . folic acid  1 mg Oral Daily  . Influenza vac split quadrivalent PF  0.5 mL Intramuscular Tomorrow-1000  . insulin aspart  0-9 Units Subcutaneous TID WC  . LORazepam  0-4 mg Intravenous Q6H   Followed by  . [START ON 04/10/2014] LORazepam  0-4 mg Intravenous Q12H  . multivitamin with minerals  1 tablet Oral Daily  . pantoprazole  40 mg Oral Daily  . sodium chloride  3 mL Intravenous Q12H  . thiamine  100 mg Oral Daily   Or  .  thiamine  100 mg Intravenous Daily  . zolpidem  10 mg Oral Once   Continuous Infusions:     Assessment/Plan: 1. Alcoholic gastritis-change Protonix to by mouth 40 mg every 12, add famotidine. 2. 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 Bicarbonate level up from 16--21 today 3. ? Diabetes mellitus/pancreatic insufficiency-await A1c. Suspect patient had hypoglycemia secondary to poor by mouth intake in the setting of ethanolism 4. 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 Tylenol levels are low 5. Alcohol abuse disorder-have discussed in detail with agent that he needs to quit drinking. She does wear. I had this discussion front of his wife. 10 minutes of counseling time done  Code Status: Full Family Communication: Discussed with wife at bedside Disposition Plan: Likely discharge tomorrow if all continues to be well   Verneita Griffes, MD  [facility] Pager [phone] 04/09/2014, 6:52 PM    LOS: 1 day

## 2014-04-09 NOTE — Progress Notes (Signed)
Pt ambulates approximately 800 ft with no problems and no assistive devices.

## 2014-04-10 DIAGNOSIS — E872 Acidosis: Secondary | ICD-10-CM

## 2014-04-10 DIAGNOSIS — K760 Fatty (change of) liver, not elsewhere classified: Secondary | ICD-10-CM

## 2014-04-10 LAB — COMPREHENSIVE METABOLIC PANEL
ALBUMIN: 3.9 g/dL (ref 3.5–5.2)
ALK PHOS: 39 U/L (ref 39–117)
ALT: 44 U/L (ref 0–53)
AST: 50 U/L — ABNORMAL HIGH (ref 0–37)
Anion gap: 4 — ABNORMAL LOW (ref 5–15)
BUN: 6 mg/dL (ref 6–23)
CALCIUM: 8.9 mg/dL (ref 8.4–10.5)
CO2: 32 mmol/L (ref 19–32)
CREATININE: 0.86 mg/dL (ref 0.50–1.35)
Chloride: 105 mmol/L (ref 96–112)
GFR calc Af Amer: >90 mL/min (ref >90)
GFR calc non Af Amer: >90 mL/min (ref >90)
Glucose, Bld: 99 mg/dL (ref 70–99)
Potassium: 5.1 mmol/L (ref 3.5–5.1)
Sodium: 141 mmol/L (ref 135–145)
TOTAL PROTEIN: 6 g/dL (ref 6.0–8.3)
Total Bilirubin: 0.9 mg/dL (ref 0.3–1.2)

## 2014-04-10 LAB — CBC WITH DIFFERENTIAL/PLATELET
BASOS ABS: 0 10*3/uL (ref 0.0–0.1)
Basophils Relative: 1 % (ref 0–1)
EOS PCT: 1 % (ref 0–5)
Eosinophils Absolute: 0 10*3/uL (ref 0.0–0.7)
HEMATOCRIT: 36.1 % — AB (ref 39.0–52.0)
Hemoglobin: 12 g/dL — ABNORMAL LOW (ref 13.0–17.0)
Lymphocytes Relative: 30 % (ref 12–46)
Lymphs Abs: 1.4 10*3/uL (ref 0.7–4.0)
MCH: 31.9 pg (ref 26.0–34.0)
MCHC: 33.2 g/dL (ref 30.0–36.0)
MCV: 96 fL (ref 78.0–100.0)
MONO ABS: 0.6 10*3/uL (ref 0.1–1.0)
Monocytes Relative: 13 % — ABNORMAL HIGH (ref 3–12)
NEUTROS ABS: 2.5 10*3/uL (ref 1.7–7.7)
NEUTROS PCT: 55 % (ref 43–77)
Platelets: 138 10*3/uL — ABNORMAL LOW (ref 150–400)
RBC: 3.76 MIL/uL — ABNORMAL LOW (ref 4.22–5.81)
RDW: 12.7 % (ref 11.5–15.5)
WBC: 4.4 10*3/uL (ref 4.0–10.5)

## 2014-04-10 LAB — HEMOGLOBIN A1C
Hgb A1c MFr Bld: 5.3 % (ref 4.8–5.6)
MEAN PLASMA GLUCOSE: 105 mg/dL

## 2014-04-10 LAB — GLUCOSE, CAPILLARY
GLUCOSE-CAPILLARY: 108 mg/dL — AB (ref 70–99)
Glucose-Capillary: 99 mg/dL (ref 70–99)
Glucose-Capillary: 98 mg/dL (ref 70–99)

## 2014-04-10 MED ORDER — INFLUENZA VAC SPLIT QUAD 0.5 ML IM SUSY: 0.5000 mL | PREFILLED_SYRINGE | INTRAMUSCULAR | Status: DC | PRN

## 2014-04-10 MED ORDER — PANTOPRAZOLE SODIUM 40 MG PO TBEC: 40.0000 mg | DELAYED_RELEASE_TABLET | Freq: Every day | ORAL | Status: DC

## 2014-04-10 MED ORDER — FAMOTIDINE 10 MG PO TABS
10.0000 mg | ORAL_TABLET | Freq: Every day | ORAL | Status: DC
Start: 2014-04-10 — End: 2014-09-18

## 2014-04-10 NOTE — Discharge Summary (Signed)
Physician Discharge Summary  Samuel [name] AGT:[id number] DOB: 08-06-1980 DOA: 04/08/2014  PCP: Default, Provider, MD  Admit date: 04/08/2014 Discharge date: 04/10/2014  Time spent: 35 minutes  Recommendations for Outpatient Follow-up:   1. Needs ETOH cessation counseling 2. Get A1c as OP   Discharge Diagnoses:  Active Problems:   GERD (gastroesophageal reflux disease)   Alcohol abuse   Fatty liver   Leukocytosis   Transaminitis   Metabolic acidosis   Intractable nausea and vomiting   Discharge Condition: good  Diet recommendation: regular  Filed Weights   04/08/14 1821  Weight: 51.71 kg (114 lb)    History of present illness:  34 y/o ? 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 Patient was kept on substance abuse alcohol withdrawal protocol and it was noted that patient had no DTs Patient wished to diet on day subsequent to admission and tolerated this well without nausea vomiting.  A1c was noted to be 5.3 and patient was counseled regarding the deleterious effects of alcohol inclusive of altered metabolism   it was felt that patient's initial hypoglycemia metabolic abnormalities were secondary to profuse vomiting Patient was felt to be hemodynamically stable and ready for discharge on 04/10/14 in and met all objective parameters for stability at that time   Discharge Exam: Filed Vitals:   04/10/14 0609  BP: 104/63  Pulse: 110  Temp: 98.2 F (36.8 C)  Resp: 18    General: eomi, ncat Cardiovascular: s1 s 2no m/r/g Respiratory: clear no added sound  Discharge Instructions   Discharge Instructions    Diet - low sodium heart healthy    Complete by:  As directed      Discharge instructions    Complete by:  As directed   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 Qty: 30 tablet, Refills: 0    pantoprazole (PROTONIX) 40 MG tablet Take 1 tablet (40 mg total) by mouth daily. Qty: 60 tablet, Refills: 0      CONTINUE these medications which have NOT CHANGED   Details  erythromycin ophthalmic ointment Place a 1/2 inch ribbon of ointment into the lower eyelid.  Apply four times daily.  Use for 14 days. Qty: 3.5 g, Refills: 0    folic acid (FOLVITE) 1 MG tablet Take 1 tablet (1 mg total) by mouth daily. Qty: 30 tablet, Refills: 0    thiamine 100 MG tablet Take 1 tablet (100 mg total) by mouth daily. Qty: 30 tablet, Refills: 0      STOP taking these medications     ibuprofen (ADVIL,MOTRIN) 200 MG tablet      ranitidine (ZANTAC) 150 MG tablet        No Known Allergies    The results of significant diagnostics from this hospitalization (including imaging, microbiology, ancillary and laboratory) are listed below for reference.    Significant  Diagnostic Studies: Dg Chest 2 View  04/08/2014   CLINICAL DATA:  Acute onset of severe nausea and vomiting. Recurrent hypoglycemia. Initial encounter.  EXAM: CHEST  2 VIEW  COMPARISON:  Chest radiograph performed 03/02/2013  FINDINGS: The lungs are well-aerated and clear. There is no evidence of focal opacification, pleural effusion or pneumothorax.  The heart is normal in size; the mediastinal contour is within normal limits. No acute osseous abnormalities are seen. The stomach contains a small to moderate amount of fluid and air.  IMPRESSION: No acute cardiopulmonary process seen.   Electronically Signed   By: Garald Balding M.D.   On: 04/08/2014 17:29   Ct Abdomen Pelvis W Contrast  04/08/2014   CLINICAL DATA:  Nausea and vomiting beginning at 0700 hours today, hypoglycemic episodes, some abdominal pain after vomiting began, stress, ethanol intake, history of GERD, colonic diverticulosis with bleeding in 2010, hiatal hernia  EXAM: CT ABDOMEN AND PELVIS WITH CONTRAST  TECHNIQUE: Multidetector CT imaging of the abdomen and pelvis was performed using the standard protocol following bolus administration of intravenous contrast. Sagittal and coronal MPR images reconstructed from axial data set.  CONTRAST:  173m OMNIPAQUE IOHEXOL 300 MG/ML SOLN, 52mOMNIPAQUE IOHEXOL 300 MG/ML SOLN  COMPARISON:  11/30/2011  FINDINGS: Lung bases clear.  Mild diffuse fatty infiltration of liver.  Liver, spleen, pancreas, kidneys, and adrenal glands normal.  Normal appendix.  Stomach and bowel loops normal appearance.  No definite mass, adenopathy, or free fluid.  Mild wall thickening of the anterior aspect of the urinary bladder although the bladder is incompletely distended.  No mass, adenopathy, free fluid or inflammatory process.  No acute osseous findings.  IMPRESSION: Fatty infiltration of liver.  Mild wall thickening of the anterior wall of the urinary bladder is likely related incomplete distention.  No definite acute intra-abdominal or intrapelvic abnormalities.   Electronically Signed   By: MaLavonia Dana.D.   On: 04/08/2014 17:53    Microbiology: No results found for this or any previous visit (from the past 240 hour(s)).   Labs: Basic Metabolic Panel:  Recent Labs Lab 04/08/14 1330 04/08/14 1747 04/10/14 0416  NA 140 135 141  K 4.8 4.3 5.1  CL 98 103 105  CO2 16* 21 32  GLUCOSE 70 120* 99  BUN _0 CREATININE 1.12 0.86 0.86  CALCIUM 9.2 7.6* 8.9  MG  --  1.6  --    Liver Function Tests:  Recent Labs Lab 04/08/14 1330 04/08/14 1747 04/10/14 0416  AST 117* 72* 50*  ALT 78* 54* 44  ALKPHOS 57 35* 39   BILITOT 1.2 0.9 0.9  PROT 8.1 5.7* 6.0  ALBUMIN 5.6* 3.7 3.9    Recent Labs Lab 04/08/14 1330  LIPASE 22   No results for input(s): AMMONIA in the last 168 hours. CBC:  Recent Labs Lab 04/08/14 1330 04/08/14 1747 04/10/14 0416  WBC 16.4* 9.6 4.4  NEUTROABS 15.0*  --  2.5  HGB 15.1 11.4* 12.0*  HCT 44.4 33.4* 36.1*  MCV 97.2 96.0 96.0  PLT 201 151 138*   Cardiac Enzymes:  Recent Labs Lab 04/08/14 1747  TROPONINI <0.03   BNP: BNP (last 3 results) No results for input(s): BNP in the last 8760 hours.  ProBNP (last 3 results) No results for input(s): PROBNP in the last 8760 hours.  CBG:  Recent Labs Lab 04/09/14 1637 04/09/14 2015 04/10/14 0021 04/10/14 0606 04/10/14 0735  GLUCAP 99 157* 108* 99 98  SignedNita Sells  [facility] 04/10/2014, 10:40 AM

## 2014-04-23 ENCOUNTER — Inpatient Hospital Stay (HOSPITAL_COMMUNITY)
Admission: EM | Admit: 2014-04-23 | Discharge: 2014-04-25 | DRG: 897 | Disposition: A | Discharge status: Home / Self Care | Payer: Medicaid Other | Attending: Internal Medicine | Admitting: Internal Medicine

## 2014-04-23 ENCOUNTER — Encounter (HOSPITAL_COMMUNITY): Payer: Self-pay | Admitting: *Deleted

## 2014-04-23 ENCOUNTER — Inpatient Hospital Stay (HOSPITAL_COMMUNITY): Payer: Medicaid Other

## 2014-04-23 DIAGNOSIS — F32A Depression, unspecified: Secondary | ICD-10-CM | POA: Diagnosis present

## 2014-04-23 DIAGNOSIS — K2970 Gastritis, unspecified, without bleeding: Secondary | ICD-10-CM | POA: Diagnosis present

## 2014-04-23 DIAGNOSIS — F4310 Post-traumatic stress disorder, unspecified: Secondary | ICD-10-CM | POA: Diagnosis present

## 2014-04-23 DIAGNOSIS — R05 Cough: Secondary | ICD-10-CM

## 2014-04-23 DIAGNOSIS — F10229 Alcohol dependence with intoxication, unspecified: Secondary | ICD-10-CM | POA: Diagnosis present

## 2014-04-23 DIAGNOSIS — D72829 Elevated white blood cell count, unspecified: Secondary | ICD-10-CM | POA: Diagnosis present

## 2014-04-23 DIAGNOSIS — E8889 Other specified metabolic disorders: Secondary | ICD-10-CM | POA: Diagnosis present

## 2014-04-23 DIAGNOSIS — K7030 Alcoholic cirrhosis of liver without ascites: Secondary | ICD-10-CM | POA: Diagnosis present

## 2014-04-23 DIAGNOSIS — R1310 Dysphagia, unspecified: Secondary | ICD-10-CM | POA: Diagnosis present

## 2014-04-23 DIAGNOSIS — E440 Moderate protein-calorie malnutrition: Secondary | ICD-10-CM | POA: Diagnosis present

## 2014-04-23 DIAGNOSIS — Z681 Body mass index (BMI) 19 or less, adult: Secondary | ICD-10-CM

## 2014-04-23 DIAGNOSIS — E872 Acidosis, unspecified: Secondary | ICD-10-CM | POA: Diagnosis present

## 2014-04-23 DIAGNOSIS — R112 Nausea with vomiting, unspecified: Secondary | ICD-10-CM | POA: Diagnosis present

## 2014-04-23 DIAGNOSIS — R059 Cough, unspecified: Secondary | ICD-10-CM

## 2014-04-23 DIAGNOSIS — E162 Hypoglycemia, unspecified: Secondary | ICD-10-CM | POA: Diagnosis present

## 2014-04-23 DIAGNOSIS — F17220 Nicotine dependence, chewing tobacco, uncomplicated: Secondary | ICD-10-CM | POA: Diagnosis present

## 2014-04-23 DIAGNOSIS — E860 Dehydration: Secondary | ICD-10-CM | POA: Diagnosis present

## 2014-04-23 DIAGNOSIS — Z792 Long term (current) use of antibiotics: Secondary | ICD-10-CM

## 2014-04-23 DIAGNOSIS — Y906 Blood alcohol level of 120-199 mg/100 ml: Secondary | ICD-10-CM | POA: Diagnosis present

## 2014-04-23 DIAGNOSIS — K219 Gastro-esophageal reflux disease without esophagitis: Secondary | ICD-10-CM | POA: Diagnosis present

## 2014-04-23 DIAGNOSIS — F329 Major depressive disorder, single episode, unspecified: Secondary | ICD-10-CM | POA: Diagnosis present

## 2014-04-23 DIAGNOSIS — R1110 Vomiting, unspecified: Secondary | ICD-10-CM | POA: Diagnosis present

## 2014-04-23 DIAGNOSIS — F10929 Alcohol use, unspecified with intoxication, unspecified: Secondary | ICD-10-CM | POA: Diagnosis present

## 2014-04-23 DIAGNOSIS — R1030 Lower abdominal pain, unspecified: Secondary | ICD-10-CM

## 2014-04-23 DIAGNOSIS — K700 Alcoholic fatty liver: Secondary | ICD-10-CM | POA: Diagnosis present

## 2014-04-23 DIAGNOSIS — F1010 Alcohol abuse, uncomplicated: Secondary | ICD-10-CM | POA: Diagnosis present

## 2014-04-23 DIAGNOSIS — K760 Fatty (change of) liver, not elsewhere classified: Secondary | ICD-10-CM | POA: Diagnosis present

## 2014-04-23 DIAGNOSIS — E8729 Other acidosis: Secondary | ICD-10-CM | POA: Diagnosis present

## 2014-04-23 LAB — SALICYLATE LEVEL: Salicylate Lvl: <4 mg/dL (ref 2.8–20.0)

## 2014-04-23 LAB — CBC WITH DIFFERENTIAL/PLATELET
BASOS ABS: 0 10*3/uL (ref 0.0–0.1)
BASOS PCT: 0 % (ref 0–1)
EOS ABS: 0 10*3/uL (ref 0.0–0.7)
Eosinophils Relative: 0 % (ref 0–5)
HCT: 42.7 % (ref 39.0–52.0)
Hemoglobin: 14.5 g/dL (ref 13.0–17.0)
Lymphocytes Relative: 4 % — ABNORMAL LOW (ref 12–46)
Lymphs Abs: 0.5 10*3/uL — ABNORMAL LOW (ref 0.7–4.0)
MCH: 32.1 pg (ref 26.0–34.0)
MCHC: 34 g/dL (ref 30.0–36.0)
MCV: 94.5 fL (ref 78.0–100.0)
Monocytes Absolute: 0.4 10*3/uL (ref 0.1–1.0)
Monocytes Relative: 3 % (ref 3–12)
NEUTROS ABS: 13 10*3/uL — AB (ref 1.7–7.7)
NEUTROS PCT: 93 % — AB (ref 43–77)
PLATELETS: 186 10*3/uL (ref 150–400)
RBC: 4.52 MIL/uL (ref 4.22–5.81)
RDW: 12.9 % (ref 11.5–15.5)
WBC: 13.9 10*3/uL — ABNORMAL HIGH (ref 4.0–10.5)

## 2014-04-23 LAB — URINALYSIS, ROUTINE W REFLEX MICROSCOPIC
BILIRUBIN URINE: NEGATIVE
Glucose, UA: >1000 mg/dL — AB
Ketones, ur: >80 mg/dL — AB
Leukocytes, UA: NEGATIVE
Nitrite: NEGATIVE
PROTEIN: NEGATIVE mg/dL
SPECIFIC GRAVITY, URINE: 1.02 (ref 1.005–1.030)
Urobilinogen, UA: 0.2 mg/dL (ref 0.0–1.0)
pH: 5 (ref 5.0–8.0)

## 2014-04-23 LAB — CBG MONITORING, ED
GLUCOSE-CAPILLARY: 181 mg/dL — AB (ref 70–99)
Glucose-Capillary: 57 mg/dL — ABNORMAL LOW (ref 70–99)
Glucose-Capillary: 239 mg/dL — ABNORMAL HIGH (ref 70–99)
Glucose-Capillary: 107 mg/dL — ABNORMAL HIGH (ref 70–99)

## 2014-04-23 LAB — I-STAT VENOUS BLOOD GAS, ED
Acid-base deficit: 11 mmol/L — ABNORMAL HIGH (ref 0.0–2.0)
Bicarbonate: 15.8 mEq/L — ABNORMAL LOW (ref 20.0–24.0)
O2 SAT: 52 %
TCO2: 17 mmol/L (ref 0–100)
pCO2, Ven: 39 mmHg — ABNORMAL LOW (ref 45.0–50.0)
pH, Ven: 7.216 — ABNORMAL LOW (ref 7.250–7.300)
pO2, Ven: 33 mmHg (ref 30.0–45.0)

## 2014-04-23 LAB — COMPREHENSIVE METABOLIC PANEL
ALT: 75 U/L — ABNORMAL HIGH (ref 0–53)
ANION GAP: 28 — AB (ref 5–15)
AST: 104 U/L — AB (ref 0–37)
Albumin: 5.5 g/dL — ABNORMAL HIGH (ref 3.5–5.2)
Alkaline Phosphatase: 58 U/L (ref 39–117)
BILIRUBIN TOTAL: 1.3 mg/dL — AB (ref 0.3–1.2)
BUN: 11 mg/dL (ref 6–23)
CALCIUM: 9.5 mg/dL (ref 8.4–10.5)
CO2: 13 mmol/L — AB (ref 19–32)
CREATININE: 1.12 mg/dL (ref 0.50–1.35)
Chloride: 97 mmol/L (ref 96–112)
GFR, EST NON AFRICAN AMERICAN: 84 mL/min — AB (ref >90)
Glucose, Bld: 65 mg/dL — ABNORMAL LOW (ref 70–99)
Potassium: 4.7 mmol/L (ref 3.5–5.1)
Sodium: 138 mmol/L (ref 135–145)
Total Protein: 7.8 g/dL (ref 6.0–8.3)

## 2014-04-23 LAB — URINE MICROSCOPIC-ADD ON

## 2014-04-23 LAB — KETONES, URINE: Ketones, ur: >80 mg/dL — AB

## 2014-04-23 LAB — ACETAMINOPHEN LEVEL: Acetaminophen (Tylenol), Serum: <10 ug/mL — ABNORMAL LOW (ref 10–30)

## 2014-04-23 LAB — I-STAT CG4 LACTIC ACID, ED
Lactic Acid, Venous: 4.53 mmol/L (ref 0.5–2.0)
Lactic Acid, Venous: 2.19 mmol/L (ref 0.5–2.0)

## 2014-04-23 LAB — ETHANOL: ALCOHOL ETHYL (B): 121 mg/dL — AB (ref 0–9)

## 2014-04-23 LAB — RAPID URINE DRUG SCREEN, HOSP PERFORMED
AMPHETAMINES: NOT DETECTED
BARBITURATES: NOT DETECTED
Benzodiazepines: NOT DETECTED
COCAINE: NOT DETECTED
OPIATES: NOT DETECTED
Tetrahydrocannabinol: NOT DETECTED

## 2014-04-23 LAB — LACTIC ACID, PLASMA: LACTIC ACID, VENOUS: 4.3 mmol/L — AB (ref 0.5–2.0)

## 2014-04-23 LAB — LIPASE, BLOOD: LIPASE: 23 U/L (ref 11–59)

## 2014-04-23 LAB — TSH: TSH: 0.598 u[IU]/mL (ref 0.350–4.500)

## 2014-04-23 MED ORDER — LORAZEPAM 2 MG/ML IJ SOLN
1.0000 mg | Freq: Once | INTRAMUSCULAR | Status: AC
  Administered 2014-04-23: 1 mg via INTRAVENOUS
  Filled 2014-04-23: qty 1

## 2014-04-23 MED ORDER — VITAMIN B-1 100 MG PO TABS
100.0000 mg | ORAL_TABLET | Freq: Every day | ORAL | Status: DC
  Administered 2014-04-23 – 2014-04-24 (×2): 100 mg via ORAL
  Filled 2014-04-23 (×3): qty 1

## 2014-04-23 MED ORDER — MORPHINE SULFATE 2 MG/ML IJ SOLN: 1.0000 mg | INTRAMUSCULAR | Status: DC | PRN

## 2014-04-23 MED ORDER — ONDANSETRON HCL 4 MG/2ML IJ SOLN: 4.0000 mg | Freq: Four times a day (QID) | INTRAMUSCULAR | Status: DC | PRN

## 2014-04-23 MED ORDER — SODIUM CHLORIDE 0.9 % IV BOLUS (SEPSIS)
1000.0000 mL | Freq: Once | INTRAVENOUS | Status: AC
  Administered 2014-04-23: 1000 mL via INTRAVENOUS

## 2014-04-23 MED ORDER — ONDANSETRON HCL 4 MG PO TABS: 4.0000 mg | ORAL_TABLET | Freq: Four times a day (QID) | ORAL | Status: DC | PRN

## 2014-04-23 MED ORDER — ADULT MULTIVITAMIN W/MINERALS CH
1.0000 | ORAL_TABLET | Freq: Every day | ORAL | Status: DC
Start: 2014-04-23 — End: 2014-04-25
  Administered 2014-04-23 – 2014-04-24 (×2): 1 via ORAL
  Filled 2014-04-23 (×3): qty 1

## 2014-04-23 MED ORDER — HYDROCODONE-ACETAMINOPHEN 5-325 MG PO TABS: 1.0000 | ORAL_TABLET | ORAL | Status: DC | PRN

## 2014-04-23 MED ORDER — LACTATED RINGERS IV SOLN
INTRAVENOUS | Status: AC
  Administered 2014-04-23: 18:00:00 via INTRAVENOUS

## 2014-04-23 MED ORDER — LORAZEPAM 2 MG/ML IJ SOLN
0.0000 mg | Freq: Two times a day (BID) | INTRAMUSCULAR | Status: DC
Start: 2014-04-25 — End: 2014-04-25

## 2014-04-23 MED ORDER — SODIUM BICARBONATE 8.4 % IV SOLN
INTRAVENOUS | Status: DC
  Administered 2014-04-23: 19:00:00 via INTRAVENOUS
  Filled 2014-04-23 (×2): qty 100

## 2014-04-23 MED ORDER — SODIUM CHLORIDE 0.9 % IJ SOLN
3.0000 mL | Freq: Two times a day (BID) | INTRAMUSCULAR | Status: DC
  Administered 2014-04-24: 3 mL via INTRAVENOUS

## 2014-04-23 MED ORDER — DEXTROSE 50 % IV SOLN
1.0000 | Freq: Once | INTRAVENOUS | Status: AC
  Administered 2014-04-23: 50 mL via INTRAVENOUS
  Filled 2014-04-23: qty 50

## 2014-04-23 MED ORDER — CHLORDIAZEPOXIDE HCL 5 MG PO CAPS
10.0000 mg | ORAL_CAPSULE | Freq: Three times a day (TID) | ORAL | Status: DC
  Administered 2014-04-23 – 2014-04-24 (×4): 10 mg via ORAL
  Filled 2014-04-23 (×4): qty 2

## 2014-04-23 MED ORDER — GUAIFENESIN-DM 100-10 MG/5ML PO SYRP
5.0000 mL | ORAL_SOLUTION | ORAL | Status: DC | PRN
Start: 2014-04-23 — End: 2014-04-25

## 2014-04-23 MED ORDER — HEPARIN SODIUM (PORCINE) 5000 UNIT/ML IJ SOLN
5000.0000 [IU] | Freq: Three times a day (TID) | INTRAMUSCULAR | Status: DC
  Administered 2014-04-23 – 2014-04-25 (×5): 5000 [IU] via SUBCUTANEOUS
  Filled 2014-04-23 (×8): qty 1

## 2014-04-23 MED ORDER — ONDANSETRON 4 MG PO TBDP
8.0000 mg | ORAL_TABLET | Freq: Once | ORAL | Status: AC
  Administered 2014-04-23: 8 mg via ORAL
  Filled 2014-04-23: qty 2

## 2014-04-23 MED ORDER — PROMETHAZINE HCL 25 MG/ML IJ SOLN
12.5000 mg | Freq: Once | INTRAMUSCULAR | Status: AC
  Administered 2014-04-23: 12.5 mg via INTRAVENOUS
  Filled 2014-04-23: qty 1

## 2014-04-23 MED ORDER — SODIUM BICARBONATE 8.4 % IV SOLN
100.0000 meq | Freq: Once | INTRAVENOUS | Status: AC
  Administered 2014-04-23: 100 meq via INTRAVENOUS
  Filled 2014-04-23: qty 50

## 2014-04-23 MED ORDER — FOLIC ACID 1 MG PO TABS
1.0000 mg | ORAL_TABLET | Freq: Every day | ORAL | Status: DC
  Administered 2014-04-23 – 2014-04-24 (×2): 1 mg via ORAL
  Filled 2014-04-23 (×3): qty 1

## 2014-04-23 MED ORDER — SODIUM CHLORIDE 0.9 % IV BOLUS (SEPSIS): 1000.0000 mL | INTRAVENOUS | Status: DC | PRN

## 2014-04-23 MED ORDER — LORAZEPAM 1 MG PO TABS: 1.0000 mg | ORAL_TABLET | Freq: Four times a day (QID) | ORAL | Status: DC | PRN

## 2014-04-23 MED ORDER — LORAZEPAM 2 MG/ML IJ SOLN
0.0000 mg | Freq: Four times a day (QID) | INTRAMUSCULAR | Status: DC
  Administered 2014-04-23: 1 mg via INTRAVENOUS
  Filled 2014-04-23: qty 1

## 2014-04-23 MED ORDER — ALUM & MAG HYDROXIDE-SIMETH 200-200-20 MG/5ML PO SUSP: 30.0000 mL | Freq: Four times a day (QID) | ORAL | Status: DC | PRN

## 2014-04-23 MED ORDER — POLYETHYLENE GLYCOL 3350 17 G PO PACK
17.0000 g | PACK | Freq: Every day | ORAL | Status: DC | PRN
  Filled 2014-04-23: qty 1

## 2014-04-23 MED ORDER — PANTOPRAZOLE SODIUM 40 MG IV SOLR
40.0000 mg | Freq: Two times a day (BID) | INTRAVENOUS | Status: DC
  Administered 2014-04-23 – 2014-04-24 (×2): 40 mg via INTRAVENOUS
  Filled 2014-04-23 (×3): qty 40

## 2014-04-23 MED ORDER — THIAMINE HCL 100 MG/ML IJ SOLN
100.0000 mg | Freq: Every day | INTRAMUSCULAR | Status: DC
  Filled 2014-04-23 (×2): qty 1

## 2014-04-23 MED ORDER — LORAZEPAM 2 MG/ML IJ SOLN: 1.0000 mg | Freq: Four times a day (QID) | INTRAMUSCULAR | Status: DC | PRN

## 2014-04-23 NOTE — ED Notes (Signed)
Pt CBG at this time 239

## 2014-04-23 NOTE — ED Notes (Addendum)
Pt seen at [facility] 2 weeks ago. Some discussion about pre-diabetic has been made. Pt in process of getting medicaid going so he can get a regular doctor. Pt does not take any medication for diabetes. Pt nauseous at this time, no emesis noted, only dry heaves. Pt reports taking children to school and laying down at 0830 to nap and has not eaten anything today.

## 2014-04-23 NOTE — ED Notes (Addendum)
CBG = 181  MD Rancour informed of results.

## 2014-04-23 NOTE — ED Notes (Signed)
NOTIFIED L.PARKER ,PA IN PERSON FOR PATIENTS LAB RESULTS OF CG4+ LACTIC ACID @17 :00PM ,04/23/2014.

## 2014-04-23 NOTE — ED Provider Notes (Signed)
CSN: [id number]     Arrival date & time 04/23/14  1359 History   First MD Initiated Contact with Patient 04/23/14 1503     Chief Complaint  Patient presents with  . Emesis  . Hypoglycemia     (Consider location/radiation/quality/duration/timing/severity/associated sxs/prior Treatment) HPI Comments: The patient is a 34 year old male presenting to the emergency department chief complaint of lightheadedness and nausea and vomiting. Patient reports feeling lightheaded at approximately 0830 today. He reports multiple episodes of vomiting since and dry heaving currently. The patient reports increase use of EtOH over the last one month do to increase in stress and being laid off. Patient reports drinking approximately 10 beers yesterday, last intake to 2200. He reports last oral intake of food 2200.  The patient denies history of diabetes, or any medication to lower blood sugars. He reports associated tremors. No aggravating or relieving factors. Patient reports similar symptoms earlier this month when he was seen in the emergency department. Declines help for cessation of drinking EtOH. Reports drinking approximately 12 beers daily. Denies history of DT or seizure from drinking cesation in the past. Denies other drug use.  Patient's wife reports the patient is drinking more than stated amount. NO PCP  Patient is a 34 y.o. male presenting with vomiting and hypoglycemia. The history is provided by the patient. No language interpreter was used.  Emesis Associated symptoms: no abdominal pain, no chills and no diarrhea   Hypoglycemia Associated symptoms: tremors, vomiting and weakness   Associated symptoms: no shortness of breath     Past Medical History  Diagnosis Date  . GERD (gastroesophageal reflux disease)   . H/O hiatal hernia     pt states he had an EGD at age 33 and had a hiatal hernia  . Dysphagia   . Diverticulitis of colon with bleeding 2010    Hospitalized in 2010 for "intestinal  infection requiring IV antibiotics"  . PTSD (post-traumatic stress disorder)   . Alcohol abuse   . Hypoglycemia     recurrent  . Elevated LFTs    Past Surgical History  Procedure Laterality Date  . Tonsillectomy    . Esophagogastroduodenoscopy      age 24  . Colonoscopy  05/15/2011    Procedure: COLONOSCOPY;  Surgeon: Irene Shipper, MD;  Location: [place];  Service: Endoscopy;  Laterality: N/A;  . Esophagogastroduodenoscopy  05/15/2011    Procedure: ESOPHAGOGASTRODUODENOSCOPY (EGD);  Surgeon: Irene Shipper, MD;  Location: [facility] ENDOSCOPY;  Service: Endoscopy;  Laterality: N/A;  . Givens capsule study  05/30/2011    Procedure: GIVENS CAPSULE STUDY;  Surgeon: Irene Shipper, MD;  Location: [place];  Service: Endoscopy;  Laterality: N/A;  Givens Capsule study.   Family History  Problem Relation Age of Onset  . Diabetes type II Mother   . Emphysema Father   . Heart attack Father   . Stroke Father   . Diabetes type II Other   . Diabetes type II Maternal Aunt    History  Substance Use Topics  . Smoking status: Never Smoker   . Smokeless tobacco: Current User    Types: Chew     Comment: States he has been chewing tobacco almost daily since age 11  . Alcohol Use: 1.8 oz/week    3 Cans of beer per week     Comment: average six 12oz beers/day, ranges from 3-12 12oz/day    Review of Systems  Constitutional: Negative for fever and chills.  Respiratory: Negative for shortness of breath.  Cardiovascular: Negative for chest pain.  Gastrointestinal: Positive for nausea and vomiting. Negative for abdominal pain, diarrhea and constipation.  Neurological: Positive for tremors, weakness and light-headedness.      Allergies  Review of patient's allergies indicates no known allergies.  Home Medications   Prior to Admission medications   Medication Sig Start Date End Date Taking? Authorizing Provider  erythromycin ophthalmic ointment Place a 1/2 inch ribbon of ointment into the  lower eyelid.  Apply four times daily.  Use for 14 days. Patient not taking: Reported on 04/08/2014 02/21/14   [name], PA-C  famotidine (PEPCID) 10 MG tablet Take 1 tablet (10 mg total) by mouth daily. 04/10/14   Nita Sells, MD  folic acid (FOLVITE) 1 MG tablet Take 1 tablet (1 mg total) by mouth daily. Patient not taking: Reported on 04/08/2014 08/27/13   [name], MD  pantoprazole (PROTONIX) 40 MG tablet Take 1 tablet (40 mg total) by mouth daily. 04/10/14   Nita Sells, MD  thiamine 100 MG tablet Take 1 tablet (100 mg total) by mouth daily. Patient not taking: Reported on 04/08/2014 08/27/13   [name], MD   BP 125/71 mmHg  Pulse 124  Temp(Src) 97.4 F (36.3 C) (Oral)  Resp 20  SpO2 94% Physical Exam  Constitutional: He is oriented to person, place, and time. He appears well-developed and well-nourished.  HENT:  Head: Normocephalic and atraumatic.  Mouth/Throat: Uvula is midline. Mucous membranes are dry.  Eyes: EOM are normal.  Neck: Neck supple.  Cardiovascular: Regular rhythm.  Tachycardia present.   Pulmonary/Chest: Effort normal. He has no wheezes. He has no rales.  Abdominal: Soft. There is no tenderness. There is no rebound and no guarding.  Neurological: He is alert and oriented to person, place, and time. He is not disoriented. No sensory deficit. GCS eye subscore is 4. GCS verbal subscore is 5. GCS motor subscore is 6.  No tremors noted during exam.  Skin: Skin is warm and dry. He is not diaphoretic.  Psychiatric: He has a normal mood and affect. His behavior is normal.  Nursing note and vitals reviewed.   ED Course  Procedures (including critical care time)  CRITICAL CARE Performed by: Lorrine Kin   Total critical care time: 35  Critical care time was exclusive of separately billable procedures and treating other patients.  Critical care was necessary to treat or prevent imminent or life-threatening deterioration.  Critical care  was time spent personally by me on the following activities: development of treatment plan with patient and/or surrogate as well as nursing, discussions with consultants, evaluation of patient's response to treatment, examination of patient, obtaining history from patient or surrogate, ordering and performing treatments and interventions, ordering and review of laboratory studies, ordering and review of radiographic studies, pulse oximetry and re-evaluation of patient's condition.  Labs Review Labs Reviewed  CBC WITH DIFFERENTIAL/PLATELET - Abnormal; Notable for the following:    WBC 13.9 (*)    Neutrophils Relative % 93 (*)    Neutro Abs 13.0 (*)    Lymphocytes Relative 4 (*)    Lymphs Abs 0.5 (*)    All other components within normal limits  COMPREHENSIVE METABOLIC PANEL - Abnormal; Notable for the following:    CO2 13 (*)    Glucose, Bld 65 (*)    Albumin 5.5 (*)    AST 104 (*)    ALT 75 (*)    Total Bilirubin 1.3 (*)    GFR calc non Af  Amer 84 (*)    Anion gap 28 (*)    All other components within normal limits  URINALYSIS, ROUTINE W REFLEX MICROSCOPIC - Abnormal; Notable for the following:    Glucose, UA >1000 (*)    Hgb urine dipstick TRACE (*)    Ketones, ur >80 (*)    All other components within normal limits  ETHANOL - Abnormal; Notable for the following:    Alcohol, Ethyl (B) 121 (*)    All other components within normal limits  LACTIC ACID, PLASMA - Abnormal; Notable for the following:    Lactic Acid, Venous 4.3 (*)    All other components within normal limits  CBG MONITORING, ED - Abnormal; Notable for the following:    Glucose-Capillary 57 (*)    All other components within normal limits  CBG MONITORING, ED - Abnormal; Notable for the following:    Glucose-Capillary 239 (*)    All other components within normal limits  I-STAT CG4 LACTIC ACID, ED - Abnormal; Notable for the following:    Lactic Acid, Venous 4.53 (*)    All other components within normal limits   I-STAT VENOUS BLOOD GAS, ED - Abnormal; Notable for the following:    pH, Ven 7.216 (*)    pCO2, Ven 39.0 (*)    Bicarbonate 15.8 (*)    Acid-base deficit 11.0 (*)    All other components within normal limits  LIPASE, BLOOD  URINE MICROSCOPIC-ADD ON  BLOOD GAS, VENOUS  URINE RAPID DRUG SCREEN (HOSP PERFORMED)  ACETAMINOPHEN LEVEL  SALICYLATE LEVEL  KETONES, URINE  SULFONYLUREA HYPOGLYCEMICS PANEL, SERUM  INSULIN, RANDOM  C-PEPTIDE  CORTISOL  TSH  HEMOGLOBIN A1C  CBG MONITORING, ED    Imaging Review No results found.   EKG Interpretation   Date/Time:  Wednesday April 23 2014 14:11:23 EST Ventricular Rate:  121 PR Interval:  124 QRS Duration: 74 QT Interval:  340 QTC Calculation: 482 R Axis:   75 Text Interpretation:  Sinus tachycardia Right atrial enlargement  Borderline ECG No significant change was found Confirmed by Wyvonnia Dusky  MD,  STEPHEN [phone]) on 04/23/2014 9:54:38 PM      MDM   Final diagnoses:  Metabolic acidosis  ETOH abuse  Nausea and vomiting in adult   Patient is a 34 year old male presenting with lightheadedness and nausea and vomiting. Initial CBG 57, patient was given 1 amp of D50, repeat CBG 239. Patient is tachycardic on arrival recently admitted for metabolic acidosis likely secondary to EtOH consumption. Patient reports last EtOH last night, mucous membranes dry heart rate on arrival 124 antiemetic given, fluids given labs ordered.  CBC shows leukocytosis 13.9. CMP shows slight transient adenitis with T bili 1.3. Patient's CO2 13 with an anion gap of 28, lactate and ABG ordered. Dr. Melanee Left also evaluated the patient during the encounter. The ABG shows pH of 8.786, metabolic acidosis, lactate elevated at 4.53 bicarbonate ordered. Plan to admit for metabolic acidosis likely secondary to EtOH use and gastritis.  Discussed with hospitalist, Candiss Norse will see pt in the ED.  Harvie Heck, PA-C 04/23/14 2156  Ezequiel Essex, MD 04/23/14 [phone]

## 2014-04-23 NOTE — H&P (Signed)
Patient Demographics  Samuel [name], is a 34 y.o. male  MRN: [id number]   DOB - 01-16-1981  Admit Date - 04/23/2014  Outpatient Primary MD for the patient is Default, Provider, MD   With History of -  Past Medical History  Diagnosis Date  . GERD (gastroesophageal reflux disease)   . H/O hiatal hernia     pt states he had an EGD at age 9 and had a hiatal hernia  . Dysphagia   . Diverticulitis of colon with bleeding 2010    Hospitalized in 2010 for "intestinal infection requiring IV antibiotics"  . PTSD (post-traumatic stress disorder)   . Alcohol abuse   . Hypoglycemia     recurrent  . Elevated LFTs       Past Surgical History  Procedure Laterality Date  . Tonsillectomy    . Esophagogastroduodenoscopy      age 19  . Colonoscopy  05/15/2011    Procedure: COLONOSCOPY;  Surgeon: Irene Shipper, MD;  Location: [facility];  Service: Endoscopy;  Laterality: N/A;  . Esophagogastroduodenoscopy  05/15/2011    Procedure: ESOPHAGOGASTRODUODENOSCOPY (EGD);  Surgeon: Irene Shipper, MD;  Location: [facility] ENDOSCOPY;  Service: Endoscopy;  Laterality: N/A;  . Givens capsule study  05/30/2011    Procedure: GIVENS CAPSULE STUDY;  Surgeon: Irene Shipper, MD;  Location: [place];  Service: Endoscopy;  Laterality: N/A;  Givens Capsule study.    in for   Chief Complaint  Patient presents with  . Emesis  . Hypoglycemia     HPI  Samuel [name]  is a 34 y.o. 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 In the ER workup consistent with severe metabolic acidosis, elevated ketones due to starvation, elevated lactic acid due to dehydration. He had reactive leukocytosis. I was called to admit the patient. Patient currently actually is feeling better after IV fluids, he is symptom free except feeling little jittery. Condone's to drinking about 12, 12 ounces of beer on a regular basis. Denies doing any drugs. His present alcohol level is pending.He was also found to have hypoglycemia upon admission.    Review of Systems    In addition to the HPI above,   No Fever-chills, No Headache, No changes with Vision or hearing, No problems swallowing food or Liquids, No Chest pain, Cough or Shortness of Breath, No Abdominal pain, positive nausea and Vommitting, Bowel movements are regular, No Blood in stool or Urine, No dysuria, No new skin rashes or bruises, No new joints pains-aches,  No new weakness, tingling, numbness in any extremity, No recent weight gain or loss, No polyuria, polydypsia  or polyphagia, No significant Mental Stressors.  A full 10 point Review of Systems was done, except as stated above, all other Review of Systems were negative.   Social History History  Substance Use Topics  . Smoking status: Never Smoker   . Smokeless tobacco: Current User    Types: Chew     Comment: States he has been chewing tobacco almost daily since age 51  . Alcohol Use: 1.8 oz/week    3 Cans of beer per week     Comment: average six 12oz beers/day, ranges from 3-12 12oz/day      Family History Family History  Problem Relation Age of Onset  . Diabetes type II Mother   . Emphysema Father   . Heart attack Father   . Stroke Father   . Diabetes type II Other   . Diabetes type II Maternal Aunt       Prior to Admission medications   Medication Sig Start Date End Date Taking? Authorizing Provider  famotidine (PEPCID) 10 MG tablet Take 1 tablet (10 mg total) by mouth daily. 04/10/14   Yes Nita Sells, MD  ibuprofen (ADVIL,MOTRIN) 200 MG tablet Take 200 mg by mouth every 6 (six) hours as needed.   Yes Historical Provider, MD  ranitidine (ZANTAC) 150 MG tablet Take 150 mg by mouth 2 (two) times daily.   Yes Historical Provider, MD  erythromycin ophthalmic ointment Place a 1/2 inch ribbon of ointment into the lower eyelid.  Apply four times daily.  Use for 14 days. Patient not taking: Reported on 04/08/2014 02/21/14   [name], PA-C  folic acid (FOLVITE) 1 MG tablet Take 1 tablet (1 mg total) by mouth daily. Patient not taking: Reported on 04/08/2014 08/27/13   [name], MD  thiamine 100 MG tablet Take 1 tablet (100 mg total) by mouth daily. Patient not taking: Reported on 04/08/2014 08/27/13   [name], MD    No Known Allergies  Physical Exam  Vitals  Blood pressure 121/76, pulse 102, temperature 97.4 F (36.3 C), temperature source Oral, resp. rate 14, SpO2 99 %.   1. General frail young Caucasian male lying in bed in NAD,    2. Normal affect and insight, Not Suicidal or Homicidal, Awake Alert, Oriented X 3.  3. No F.N deficits, ALL C.Nerves Intact, Strength 5/5 all 4 extremities, Sensation intact all 4 extremities, Plantars down going.  4. Ears and Eyes appear Normal, Conjunctivae clear, PERRLA. Moist Oral Mucosa.  5. Supple Neck, No JVD, No cervical lymphadenopathy appriciated, No Carotid Bruits.  6. Symmetrical Chest wall movement, Good air movement bilaterally, CTAB.  7. RRR, No Gallops, Rubs or Murmurs, No Parasternal Heave.  8. Positive Bowel Sounds, Abdomen Soft, No tenderness, No organomegaly appriciated,No rebound -guarding or rigidity.  9.  No Cyanosis, Normal Skin Turgor, No Skin Rash or Bruise.  10. Good muscle tone,  joints appear normal , no effusions, Normal ROM.  11. No Palpable Lymph Nodes in Neck or Axillae    Data Review  CBC  Recent Labs Lab 04/23/14 1436  WBC 13.9*  HGB 14.5  HCT 42.7  PLT 186  MCV 94.5    MCH 32.1  MCHC 34.0  RDW 12.9  LYMPHSABS 0.5*  MONOABS 0.4  EOSABS 0.0  BASOSABS 0.0   ------------------------------------------------------------------------------------------------------------------  Chemistries   Recent Labs Lab 04/23/14 1436  NA 138  K 4.7  CL 97  CO2 13*  GLUCOSE 65*  BUN 11  CREATININE 1.12  CALCIUM 9.5  AST 104*  ALT 75*  ALKPHOS 58  BILITOT 1.3*   ------------------------------------------------------------------------------------------------------------------ CrCl cannot be calculated (Unknown ideal weight.). ------------------------------------------------------------------------------------------------------------------ No results for input(s): TSH, T4TOTAL, T3FREE, THYROIDAB in the last 72 hours.  Invalid input(s): FREET3   Coagulation profile No results for input(s): INR, PROTIME in the last 168 hours. ------------------------------------------------------------------------------------------------------------------- No results for input(s): DDIMER in the last 72 hours. -------------------------------------------------------------------------------------------------------------------  Cardiac Enzymes No results for input(s): CKMB, TROPONINI, MYOGLOBIN in the last 168 hours.  Invalid input(s): CK ------------------------------------------------------------------------------------------------------------------ Invalid input(s): POCBNP   ---------------------------------------------------------------------------------------------------------------  Urinalysis    Component Value Date/Time   COLORURINE YELLOW 04/23/2014 [place] 04/23/2014 1530   LABSPEC 1.020 04/23/2014 1530   PHURINE 5.0 04/23/2014 1530   GLUCOSEU >1000* 04/23/2014 1530   HGBUR TRACE* 04/23/2014 1530   BILIRUBINUR NEGATIVE 04/23/2014 1530   KETONESUR >80* 04/23/2014 1530   PROTEINUR NEGATIVE 04/23/2014 1530   UROBILINOGEN 0.2 04/23/2014 1530    NITRITE NEGATIVE 04/23/2014 1530   LEUKOCYTESUR NEGATIVE 04/23/2014 1530    ----------------------------------------------------------------------------------------------------------------  Imaging results:   No results found.  My personal review of EKG: Rhythm STach, Rate  121 /min,   no Acute ST changes    Assessment & Plan   1. Severe metabolic acidosis due to combination of starvation ketosis, alcohol intoxication and dehydration causing elevated lactate levels. Will be admitted stepdown unit for close monitoring, seizure precautions due a double alcohol withdrawal and severe acidosis. IV bicarbonate drip, IV fluid bolus along with maintenance with normal saline, monitor ABG in the morning and along with BMP.   2. Alcohol intoxication. Counseled to quit, schedule Librium along with CIWA protocol.   3. Gastritis. IV PPI  4. 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 Outpatient GI follow-up.   5. Reactive leukocytosis. Check baseline chest x-ray along with abdomen x-ray to rule out any obstruction was his aspiration. He did have nausea and vomiting but likely due to severe acidosis and alcohol intoxication. Denies any aspiration.   6. Hypoglycemia. This happened last admission as well. Will check cortisol, TSH, sulfa Nile urea, C-peptide, A1c. Currently on D5 drip for getting bicarbonate, we will also monitor CBGs frequently. Hypoglycemia protocol as needed.     DVT Prophylaxis Heparin    AM Labs Ordered, also please review Full Orders  Family Communication: Admission, patients condition and plan of care including tests being ordered have been discussed with the patient and wife who indicate understanding and agree with the plan and Code Status.  Code Status Full  Likely DC to Full  Condition GUARDED     Time spent in minutes : 35    SINGH,PRASHANT K M.D on 04/23/2014 at 5:36 PM  Between 7am to 7pm - Pager - [phone]  After 7pm go to [email or web address]  - [facility] Office  [phone]

## 2014-04-23 NOTE — ED Notes (Signed)
CRITICAL VALUE ALERT  Critical value received:  Lactic Acid 4.3  Date of notification:  04/23/2014  Time of notification:  17:22  Critical value read back: yes   Nurse who received alert:  Renne Crigler RN   MD notified (1st page):  Rancour MD

## 2014-04-23 NOTE — ED Provider Notes (Signed)
Medical screening examination/treatment/procedure(s) were conducted as a shared visit with non-physician practitioner(s) and myself.  I personally evaluated the patient during the encounter.  Patient with nausea, dry heaves, lightheadedness. Blood sugar 57 in triage. Recent admission for alcoholic gastritis and starvation ketosis. Patient with excessive alcohol intake yesterday. Denies abdominal pain, chest pain or vomiting. Dry mucous membranes, tachycardic, abdomen soft without peritoneal signs  Anion gap acidosis with lactate elevation. Persistent tachycardia and starvation ketosis.     EKG Interpretation   Date/Time:  Wednesday April 23 2014 14:11:23 EST Ventricular Rate:  121 PR Interval:  124 QRS Duration: 74 QT Interval:  340 QTC Calculation: 482 R Axis:   75 Text Interpretation:  Sinus tachycardia Right atrial enlargement  Borderline ECG No significant change was found Confirmed by Wyvonnia Dusky  MD,  Elfego Giammarino [phone]) on 04/23/2014 9:54:38 PM        Ezequiel Essex, MD 04/23/14 [phone]

## 2014-04-23 NOTE — ED Notes (Signed)
Pt reports having hypoglycemia and reports feeling lightheaded, weak, n/v. cbg 57 at triage.

## 2014-04-23 NOTE — ED Notes (Signed)
Clear liquid tray ordered 02/17

## 2014-04-23 NOTE — ED Notes (Signed)
CBG 57 reported to RN

## 2014-04-24 DIAGNOSIS — F4310 Post-traumatic stress disorder, unspecified: Secondary | ICD-10-CM

## 2014-04-24 DIAGNOSIS — E872 Acidosis: Secondary | ICD-10-CM

## 2014-04-24 DIAGNOSIS — K760 Fatty (change of) liver, not elsewhere classified: Secondary | ICD-10-CM

## 2014-04-24 DIAGNOSIS — F329 Major depressive disorder, single episode, unspecified: Secondary | ICD-10-CM

## 2014-04-24 DIAGNOSIS — F1010 Alcohol abuse, uncomplicated: Secondary | ICD-10-CM

## 2014-04-24 DIAGNOSIS — F10120 Alcohol abuse with intoxication, uncomplicated: Secondary | ICD-10-CM

## 2014-04-24 DIAGNOSIS — R110 Nausea: Secondary | ICD-10-CM

## 2014-04-24 DIAGNOSIS — R112 Nausea with vomiting, unspecified: Secondary | ICD-10-CM | POA: Diagnosis present

## 2014-04-24 DIAGNOSIS — F32A Depression, unspecified: Secondary | ICD-10-CM | POA: Diagnosis present

## 2014-04-24 LAB — RAPID URINE DRUG SCREEN, HOSP PERFORMED
Amphetamines: NOT DETECTED
BARBITURATES: NOT DETECTED
Benzodiazepines: NOT DETECTED
Cocaine: NOT DETECTED
OPIATES: NOT DETECTED
TETRAHYDROCANNABINOL: NOT DETECTED

## 2014-04-24 LAB — MRSA PCR SCREENING: MRSA by PCR: NEGATIVE

## 2014-04-24 LAB — C-PEPTIDE: C-Peptide: 2.1 ng/mL (ref 1.1–4.4)

## 2014-04-24 LAB — CBC
HCT: 31.1 % — ABNORMAL LOW (ref 39.0–52.0)
Hemoglobin: 10.7 g/dL — ABNORMAL LOW (ref 13.0–17.0)
MCH: 31.9 pg (ref 26.0–34.0)
MCHC: 34.4 g/dL (ref 30.0–36.0)
MCV: 92.8 fL (ref 78.0–100.0)
PLATELETS: 133 10*3/uL — AB (ref 150–400)
RBC: 3.35 MIL/uL — ABNORMAL LOW (ref 4.22–5.81)
RDW: 12.7 % (ref 11.5–15.5)
WBC: 6.2 10*3/uL (ref 4.0–10.5)

## 2014-04-24 LAB — BLOOD GAS, ARTERIAL
ACID-BASE EXCESS: 5.1 mmol/L — AB (ref 0.0–2.0)
Bicarbonate: 29 mEq/L — ABNORMAL HIGH (ref 20.0–24.0)
Drawn by: 34762
FIO2: 0.21 %
O2 Saturation: 98 %
PO2 ART: 98 mmHg (ref 80.0–100.0)
Patient temperature: 98.1
TCO2: 30.4 mmol/L (ref 0–100)
pCO2 arterial: 42.1 mmHg (ref 35.0–45.0)
pH, Arterial: 7.452 — ABNORMAL HIGH (ref 7.350–7.450)

## 2014-04-24 LAB — GLUCOSE, CAPILLARY
GLUCOSE-CAPILLARY: 150 mg/dL — AB (ref 70–99)
Glucose-Capillary: 95 mg/dL (ref 70–99)
Glucose-Capillary: 148 mg/dL — ABNORMAL HIGH (ref 70–99)

## 2014-04-24 LAB — BASIC METABOLIC PANEL
Anion gap: 6 (ref 5–15)
CHLORIDE: 101 mmol/L (ref 96–112)
CO2: 31 mmol/L (ref 19–32)
Calcium: 8.1 mg/dL — ABNORMAL LOW (ref 8.4–10.5)
Creatinine, Ser: 0.8 mg/dL (ref 0.50–1.35)
GFR calc Af Amer: >90 mL/min (ref >90)
Glucose, Bld: 81 mg/dL (ref 70–99)
POTASSIUM: 3.9 mmol/L (ref 3.5–5.1)
Sodium: 138 mmol/L (ref 135–145)

## 2014-04-24 LAB — HEMOGLOBIN A1C
HEMOGLOBIN A1C: 5.5 % (ref 4.8–5.6)
Mean Plasma Glucose: 111 mg/dL

## 2014-04-24 LAB — CORTISOL: Cortisol, Plasma: 24.3 ug/dL

## 2014-04-24 MED ORDER — ENSURE COMPLETE PO LIQD
237.0000 mL | Freq: Two times a day (BID) | ORAL | Status: DC
  Administered 2014-04-24: 237 mL via ORAL

## 2014-04-24 MED ORDER — PANTOPRAZOLE SODIUM 40 MG PO TBEC
40.0000 mg | DELAYED_RELEASE_TABLET | Freq: Two times a day (BID) | ORAL | Status: DC
  Administered 2014-04-24: 40 mg via ORAL
  Filled 2014-04-24: qty 1

## 2014-04-24 NOTE — Progress Notes (Addendum)
[facility] TEAM 1 - Stepdown/ICU TEAM Progress Note  Loring Liskey WUJ:[id number] DOB: [birth year] DOA: 04/23/2014 PCP: Default, Provider, MD  Admit HPI / Brief Narrative: Samuel [name] is a 34 yo WM PMHx of  alcohol abuse GERD, hx of hiatal hernia, dysphagia,  hypoglycemia, PTSD, depression, with recent admission for alcohol intoxication and metabolic acidosis, that presented to the ED with nausea, vomiting, and feeling weak for past 2 days. He stated he has not been eating well and has been drinking about 10-12 beers per day. He states he has been going through a lot of stress at home and has been dealing with a death in the family and thus feels depressed. He denies smoking or any other drugs. In ED, workup consistent with metabolic acidosis, elevated ketones, lactic acidosis, and WBC. He is admitted with alcohol intoxication and metabolic acidosis. UDS negative  HPI/Subjective: A and O x4- states is depressed but refusing psych consult. States wants to eat. Denies any headache, shakiness, or other alcohol withdrawal symtpoms  Assessment/Plan:  Alcohol intoxication  -alcohol level 121 -UDS negative -placed on CIWA -Psych consult pending  Severe metabolic acidosis -resolved on repeat ABG/BMET -Bicarb gtt d/c's -repeat ABG pending  Leukocytosis/N/V -resolved- likely reactive due to alcohol intox -Abd/chest xray- no cute findings -tolerating regular diet well with no n/v/abd pain  Hypoglycemia -CBGs stable -cortisol/ cpeptide- normal . Insulin/ solfonylurea pending -admits to poor po intake due to binge drinking  Severe depression/PTSD -placed psych consult   GERD -transitioned to oral protonix BID  Code Status: FULL Family Communication: Family at bedside Disposition Plan: SDU for alcohol w/d    Consultants: Psychiatry via EPIC  Procedure/Significant Events:    Culture None  Antibiotics: none  DVT prophylaxis: SQ Heparin   Devices None   LINES /  TUBES:  None    Continuous Infusions:   Objective: VITAL SIGNS: Temp: 99 F (37.2 C) (02/18 1954) Temp Source: Oral (02/18 1954) BP: 97/57 mmHg (02/18 1954) Pulse Rate: 80 (02/18 1954) SPO2; FIO2:   Intake/Output Summary (Last 24 hours) at 04/24/14 2241 Last data filed at 04/24/14 1956  Gross per 24 hour  Intake   2635 ml  Output   3100 ml  Net   -465 ml     Exam: General: frail looking Caucasian male in NAD Lungs: Clear to auscultation bilaterally without wheezes or crackles Cardiovascular: Regular rate and rhythm without murmur gallop or rub normal S1 and S2 Abdomen: Nontender, nondistended, soft, bowel sounds positive, no rebound, no ascites, no appreciable mass Extremities: No significant cyanosis, clubbing, or edema bilateral lower extremities  Data Reviewed: Basic Metabolic Panel:  Recent Labs Lab 04/23/14 1436 04/24/14 0322  NA 138 138  K 4.7 3.9  CL 97 101  CO2 13* 31  GLUCOSE 65* 81  BUN 11 <5*  CREATININE 1.12 0.80  CALCIUM 9.5 8.1*   Liver Function Tests:  Recent Labs Lab 04/23/14 1436  AST 104*  ALT 75*  ALKPHOS 58  BILITOT 1.3*  PROT 7.8  ALBUMIN 5.5*    Recent Labs Lab 04/23/14 1436  LIPASE 23   No results for input(s): AMMONIA in the last 168 hours. CBC:  Recent Labs Lab 04/23/14 1436 04/24/14 0322  WBC 13.9* 6.2  NEUTROABS 13.0*  --   HGB 14.5 10.7*  HCT 42.7 31.1*  MCV 94.5 92.8  PLT 186 133*   Cardiac Enzymes: No results for input(s): CKTOTAL, CKMB, CKMBINDEX, TROPONINI in the last 168 hours. BNP (last 3 results) No results for  input(s): BNP in the last 8760 hours.  ProBNP (last 3 results) No results for input(s): PROBNP in the last 8760 hours.  CBG:  Recent Labs Lab 04/23/14 1754 04/23/14 1958 04/24/14 0006 04/24/14 0526 04/24/14 1111  GLUCAP 107* 181* 150* 95 148*    Recent Results (from the past 240 hour(s))  MRSA PCR Screening     Status: None   Collection Time: 04/24/14  6:08 AM  Result  Value Ref Range Status   MRSA by PCR NEGATIVE NEGATIVE Final    Comment:        The GeneXpert MRSA Assay (FDA approved for NASAL specimens only), is one component of a comprehensive MRSA colonization surveillance program. It is not intended to diagnose MRSA infection nor to guide or monitor treatment for MRSA infections.      Studies:  Recent x-ray studies have been reviewed in detail by the Attending Physician  Scheduled Meds:  Scheduled Meds: . chlordiazePOXIDE  10 mg Oral TID  . feeding supplement (ENSURE COMPLETE)  237 mL Oral BID BM  . folic acid  1 mg Oral Daily  . heparin  5,000 Units Subcutaneous 3 times per day  . LORazepam  0-4 mg Intravenous Q6H   Followed by  . [START ON 04/25/2014] LORazepam  0-4 mg Intravenous Q12H  . multivitamin with minerals  1 tablet Oral Daily  . pantoprazole  40 mg Oral BID  . sodium chloride  3 mL Intravenous Q12H  . thiamine  100 mg Oral Daily   Or  . thiamine  100 mg Intravenous Daily    Time spent on care of this patient: 40 mins   Allie Bossier [facility]  [facility] Office  [phone] Pager - [phone]  On-Call/Text Page:      [email or web address]      password [email or web address]  If 7PM-7AM, please contact night-coverage [email or web address] Password [facility] 04/24/2014, 10:41 PM   LOS: 1 day   Examined Patient and discussed A&P with PA Sahar and agree with above plan.  I have reviewed the entire database. I have made any necessary editorial changes, and agree with its content. I have reviewed this patient's available data, including medical history, events of note, physical examination, radiology studies and test results as part of my evaluation Pt with Multiple Complex medical problems> 35 min spent in direct Pt care   Dia Crawford, MD [facility] [phone] pager

## 2014-04-24 NOTE — Progress Notes (Signed)
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   Goal: Intake to meet >90% of estimated nutrition needs.  Monitor:  PO intake, labs, weight trend.  Reason for Assessment: Malnutrition Screening Tool  34 y.o. male  Admitting Dx: Alcohol abuse  ASSESSMENT: Patient admitted to the hospital on 2/17 with emesis and hypoglycemia. 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 Recent admission for alcohol intoxication.   Patient reports that he usually weighs ~120 lbs, he's always been small, and all the people in his family are small. He has tried [employer] before and doesn't really like it. Says he is ready for real food. Diet being advanced to regular today. Willing to try Ensure supplements to maximize protein and calorie intake.  Nutrition Focused Physical Exam:  Subcutaneous Fat:  Orbital Region: WNL Upper Arm Region: moderate depletion Thoracic and Lumbar Region: WNL  Muscle:  Temple Region: N/A Clavicle Bone Region: moderate depletion Clavicle and Acromion Bone Region: moderate depletion Scapular Bone Region: WNL Dorsal Hand: WNL Patellar Region: moderate depletion Anterior Thigh Region: moderate depletion Posterior Calf Region: moderate depletion  Edema: none   Height: Ht Readings from Last 1 Encounters:  04/23/14 5\' 8"  (1.727 m)    Weight: Wt Readings from Last 1 Encounters:  04/23/14 112 lb 10.5 oz (51.1 kg)    Ideal Body Weight: 70 kg  % Ideal Body Weight: 73%  Wt Readings from Last 10 Encounters:  04/23/14 112  lb 10.5 oz (51.1 kg)  04/08/14 114 lb (51.71 kg)  02/21/14 120 lb (54.432 kg)  08/26/13 121 lb (54.885 kg)  03/03/13 112 lb 3.4 oz (50.9 kg)  06/01/12 115 lb 2 oz (52.22 kg)  05/29/12 117 lb 3.2 oz (53.162 kg)  05/30/11 120 lb (54.432 kg)  05/13/11 123 lb 7.3 oz (56 kg)    Usual Body Weight: 120 lbs  % Usual Body Weight: 93%  BMI:  Body mass index is 17.13 kg/(m^2). Underweight  Estimated Nutritional Needs: Kcal: 1800-2000 Protein: 90-100 gm Fluid: 1.8-2 L  Skin: no issues  Diet Order: Diet regular  EDUCATION NEEDS: -Education needs addressed   Intake/Output Summary (Last 24 hours) at 04/24/14 1206 Last data filed at 04/24/14 1120  Gross per 24 hour  Intake   3160 ml  Output   2150 ml  Net   1010 ml    Last BM: unknown   Labs:   Recent Labs Lab 04/23/14 1436 04/24/14 0322  NA 138 138  K 4.7 3.9  CL 97 101  CO2 13* 31  BUN 11 <5*  CREATININE 1.12 0.80  CALCIUM 9.5 8.1*  GLUCOSE 65* 81    CBG (last 3)   Recent Labs  04/23/14 1958 04/24/14 0006 04/24/14 0526  GLUCAP 181* 150* 95    Scheduled Meds: . chlordiazePOXIDE  10 mg Oral TID  . folic acid  1 mg Oral Daily  . heparin  5,000 Units Subcutaneous 3 times per day  . LORazepam  0-4 mg Intravenous Q6H   Followed by  . [START ON 04/25/2014] LORazepam  0-4 mg Intravenous Q12H  . multivitamin with minerals  1 tablet Oral Daily  .  pantoprazole (PROTONIX) IV  40 mg Intravenous Q12H  . sodium chloride  3 mL Intravenous Q12H  . thiamine  100 mg Oral Daily   Or  . thiamine  100 mg Intravenous Daily    Continuous Infusions: . lactated ringers 125 mL/hr at 04/23/14 1748  .  sodium bicarbonate  infusion 1000 mL 50 mL/hr at 04/23/14 1857    Past Medical History  Diagnosis Date  . GERD (gastroesophageal reflux disease)   . H/O hiatal hernia     pt states he had an EGD at age 61 and had a hiatal hernia  . Dysphagia   . Diverticulitis of colon with bleeding 2010    Hospitalized in 2010 for  "intestinal infection requiring IV antibiotics"  . PTSD (post-traumatic stress disorder)   . Alcohol abuse   . Hypoglycemia     recurrent  . Elevated LFTs     Past Surgical History  Procedure Laterality Date  . Tonsillectomy    . Esophagogastroduodenoscopy      age 65  . Colonoscopy  05/15/2011    Procedure: COLONOSCOPY;  Surgeon: Irene Shipper, MD;  Location: [place];  Service: Endoscopy;  Laterality: N/A;  . Esophagogastroduodenoscopy  05/15/2011    Procedure: ESOPHAGOGASTRODUODENOSCOPY (EGD);  Surgeon: Irene Shipper, MD;  [facility];  Service: Endoscopy;  Laterality: N/A;  . Givens capsule study  05/30/2011    Procedure: GIVENS CAPSULE STUDY;  Surgeon: Irene Shipper, MD;  Location: [place];  Service: Endoscopy;  Laterality: N/A;  Givens Capsule study.    Molli Barrows, RD, LDN, [place] Pager [phone] After Hours Pager [phone]

## 2014-04-24 NOTE — Clinical Social Work Psychosocial (Addendum)
Clinical Social Work Department BRIEF PSYCHOSOCIAL ASSESSMENT 04/24/2014  Patient:  Samuel [name],Samuel [name]     Account Number:  [id number]     Admit date:  04/23/2014  Clinical Social Worker:  BIBBS,DYSHEKA, LCSWA  Date/Time:  04/24/2014 02:03 PM  Referred by:  RN  Date Referred:  04/24/2014 Referred for  Substance Abuse   Other Referral:   Interview type:  Patient Other interview type:    PSYCHOSOCIAL DATA Living Status:  WIFE Admitted from facility:   Level of care:   Primary support name:  Samuel [name] Primary support relationship to patient:  SPOUSE Degree of support available:   Strong Support    CURRENT CONCERNS Current Concerns  None Noted   Other Concerns:    SOCIAL WORK ASSESSMENT / PLAN CSW met the pt at bedside. CSW introduced self and purpose of the visit. Pt reported he currently lives at home with his wife. Pt provided CSW with details regarding his current admission. Pt reported feelings of being distress. Pt identified 5 life changing events which has occurred over the last 2 months. Pt reported increase in drinking to help him cope. Pt acknowledged that drinking will not solve the problem. Pt was open to receiving community resources.   Assessment/plan status:  No Further Intervention Required Other assessment/ plan:   SBIRT complete Financial counselor contact regarding Medicaid application   Information/referral to community resources:   ETOH treatment agencies both in and outpatient services.    PATIENT'S/FAMILY'S RESPONSE TO PLAN OF CARE: Pt was receptive during the assessment. Pt was open to receving community resources. Pt expressed wanting help.       Dysheka Bibbs, MSW, LCSWA [phone] 

## 2014-04-24 NOTE — Progress Notes (Signed)
Utilization review completed.  

## 2014-04-25 LAB — COMPREHENSIVE METABOLIC PANEL
ALT: 41 U/L (ref 0–53)
AST: 59 U/L — ABNORMAL HIGH (ref 0–37)
Albumin: 3.4 g/dL — ABNORMAL LOW (ref 3.5–5.2)
Alkaline Phosphatase: 39 U/L (ref 39–117)
Anion gap: 7 (ref 5–15)
BILIRUBIN TOTAL: 1.2 mg/dL (ref 0.3–1.2)
BUN: <5 mg/dL — ABNORMAL LOW (ref 6–23)
CO2: 31 mmol/L (ref 19–32)
Calcium: 8.8 mg/dL (ref 8.4–10.5)
Chloride: 103 mmol/L (ref 96–112)
Creatinine, Ser: 0.77 mg/dL (ref 0.50–1.35)
GFR calc Af Amer: >90 mL/min (ref >90)
GFR calc non Af Amer: >90 mL/min (ref >90)
Glucose, Bld: 124 mg/dL — ABNORMAL HIGH (ref 70–99)
POTASSIUM: 3.7 mmol/L (ref 3.5–5.1)
Sodium: 141 mmol/L (ref 135–145)
TOTAL PROTEIN: 5.1 g/dL — AB (ref 6.0–8.3)

## 2014-04-25 LAB — BLOOD GAS, ARTERIAL
Acid-Base Excess: 5.1 mmol/L — ABNORMAL HIGH (ref 0.0–2.0)
Bicarbonate: 29.1 mEq/L — ABNORMAL HIGH (ref 20.0–24.0)
FIO2: 0.21 %
O2 Saturation: 97.9 %
PATIENT TEMPERATURE: 98.6
PH ART: 7.445 (ref 7.350–7.450)
TCO2: 30.4 mmol/L (ref 0–100)
pCO2 arterial: 43.1 mmHg (ref 35.0–45.0)
pO2, Arterial: 103 mmHg — ABNORMAL HIGH (ref 80.0–100.0)

## 2014-04-25 LAB — CBC WITH DIFFERENTIAL/PLATELET
BASOS PCT: 0 % (ref 0–1)
Basophils Absolute: 0 10*3/uL (ref 0.0–0.1)
EOS PCT: 1 % (ref 0–5)
Eosinophils Absolute: 0.1 10*3/uL (ref 0.0–0.7)
HEMATOCRIT: 33 % — AB (ref 39.0–52.0)
Hemoglobin: 11.3 g/dL — ABNORMAL LOW (ref 13.0–17.0)
Lymphocytes Relative: 20 % (ref 12–46)
Lymphs Abs: 1.1 10*3/uL (ref 0.7–4.0)
MCH: 31.8 pg (ref 26.0–34.0)
MCHC: 34.2 g/dL (ref 30.0–36.0)
MCV: 93 fL (ref 78.0–100.0)
MONO ABS: 0.5 10*3/uL (ref 0.1–1.0)
Monocytes Relative: 9 % (ref 3–12)
Neutro Abs: 3.6 10*3/uL (ref 1.7–7.7)
Neutrophils Relative %: 69 % (ref 43–77)
PLATELETS: 129 10*3/uL — AB (ref 150–400)
RBC: 3.55 MIL/uL — ABNORMAL LOW (ref 4.22–5.81)
RDW: 12.6 % (ref 11.5–15.5)
WBC: 5.1 10*3/uL (ref 4.0–10.5)

## 2014-04-25 LAB — GLUCOSE, CAPILLARY
Glucose-Capillary: 121 mg/dL — ABNORMAL HIGH (ref 70–99)
Glucose-Capillary: 129 mg/dL — ABNORMAL HIGH (ref 70–99)
Glucose-Capillary: 180 mg/dL — ABNORMAL HIGH (ref 70–99)

## 2014-04-25 LAB — LACTIC ACID, PLASMA: Lactic Acid, Venous: 2.4 mmol/L (ref 0.5–2.0)

## 2014-04-25 LAB — MAGNESIUM: MAGNESIUM: 1.9 mg/dL (ref 1.5–2.5)

## 2014-04-25 NOTE — Discharge Summary (Signed)
DISCHARGE SUMMARY  Samuel [name]  MR#: [id number]  DOB:08-16-1980  Date of Admission: 04/23/2014 Date of Discharge: 04/25/2014  Attending Physician:Shelene Krage T  Patient's ZTI:[id number], Provider, MD  Consults:  none  Disposition: D/C home   Follow-up Appts:     Follow-up Information    Follow up with Primary Care Provider of your choice . Schedule an appointment as soon as possible for a visit in 1 week.      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 He stated he had not been eating well and had been drinking about 10-12 beers per day. He stated he had been going through a lot of stress at home and had been dealing with a death in the family and felt depressed. He denied smoking or any other drugs.   In the ED his workup was c/w consistent with metabolic acidosis/lactic acidosis, elevated ketones, and and elevated WBC. UDS was negative.  Hospital Course:  Alcohol intoxication  -alcohol level 121 at admit  -UDS negative -placed on CIWA - counseled to quite drinking - CSW has advised pt on community resources to assist him w/ abstinence - pt is motivated and has been thoroughly advised on the absolute need to stop drinking completely, as well as the dire consequences of failing to do so  -spoke w/ pt and wife at length at the bedside prior to d/c home   Severe metabolic acidosis - lactic acidosis  -due to combination of starvation  ketosis, alcohol intoxication, and dehydration leading to lactic acidosis  -resolved - lactate has not yet normalized, but is declining, and pt no longer acidotic on ABG   Leukocytosis - N/V -resolved - likely reactive due to alcohol intox -Abd/chest xray- no acute findings -tolerated regular diet well with no n/v/abd pain prior to d/c   Hypoglycemia -CBGs stable prior to d/c  -cortisol, c peptide both normal - Insulin/ solfonylurea pending but intentional medication abuse not felt to be likely - this is most likely related to starvation in the face of binge EtOH consumption -admits to poor po intake due to binge drinking  Severe depression/PTSD -Psych consult placed during admit, but pt denied suicidal ideation and decided he did not wish to see the Psychiatrist   GERD -resume usual pepcid OTC at d/c - will likely resolve if pt stops drinking EtOH  Fatty liver LFTs elevated in pattern c/w EtOH abuse - warned pt that he is already displaying evidence of liver damage, and that continued drinking will almost certainly lead to cirrhosis   Moderate malnutrition in the context of chronic illness - underweight    Medication List    STOP taking these medications        erythromycin ophthalmic ointment     ranitidine 150 MG tablet  Commonly known as:  ZANTAC      TAKE these medications        famotidine 10 MG tablet  Commonly known as:  PEPCID  Take 1 tablet (10 mg total) by mouth daily.  folic acid 1 MG tablet  Commonly known as:  FOLVITE  Take 1 tablet (1 mg total) by mouth daily.     ibuprofen 200 MG tablet  Commonly known as:  ADVIL,MOTRIN  Take 200 mg by mouth every 6 (six) hours as needed.     thiamine 100 MG tablet  Take 1 tablet (100 mg total) by mouth daily.        Day of Discharge BP 112/71 mmHg  Pulse 70  Temp(Src) 98 F (36.7 C) (Oral)  Resp 11  Ht 5\' 9"  (1.753 m)  Wt 51.5 kg (113 lb 8.6 oz)  BMI 16.76 kg/m2  SpO2 99%  Physical  Exam: General: No acute respiratory distress Lungs: Clear to auscultation bilaterally without wheezes or crackles Cardiovascular: Regular rate and rhythm without murmur gallop or rub - normal S1 and S2 Abdomen: Nontender, nondistended, soft, bowel sounds positive, no rebound, no ascites, no appreciable mass Extremities: No significant cyanosis, clubbing, or edema bilateral lower extremities  Basic Metabolic Panel:  Recent Labs Lab 04/23/14 1436 04/24/14 0322 04/25/14 0353  NA 138 138 141  K 4.7 3.9 3.7  CL 97 101 103  CO2 13* 31 31  GLUCOSE 65* 81 124*  BUN 11 <5* <5*  CREATININE 1.12 0.80 0.77  CALCIUM 9.5 8.1* 8.8  MG  --   --  1.9    Liver Function Tests:  Recent Labs Lab 04/23/14 1436 04/25/14 0353  AST 104* 59*  ALT 75* 41  ALKPHOS 58 39  BILITOT 1.3* 1.2  PROT 7.8 5.1*  ALBUMIN 5.5* 3.4*    Recent Labs Lab 04/23/14 1436  LIPASE 23   CBC:  Recent Labs Lab 04/23/14 1436 04/24/14 0322 04/25/14 0353  WBC 13.9* 6.2 5.1  NEUTROABS 13.0*  --  3.6  HGB 14.5 10.7* 11.3*  HCT 42.7 31.1* 33.0*  MCV 94.5 92.8 93.0  PLT 186 133* 129*   CBG:  Recent Labs Lab 04/24/14 0006 04/24/14 0526 04/24/14 1111 04/25/14 0028 04/25/14 0510  GLUCAP 150* 95 148* 129* 121*    Recent Results (from the past 240 hour(s))  MRSA PCR Screening     Status: None   Collection Time: 04/24/14  6:08 AM  Result Value Ref Range Status   MRSA by PCR NEGATIVE NEGATIVE Final    Comment:        The GeneXpert MRSA Assay (FDA approved for NASAL specimens only), is one component of a comprehensive MRSA colonization surveillance program. It is not intended to diagnose MRSA infection nor to guide or monitor treatment for MRSA infections.     Time spent in discharge (includes decision making & examination of pt): >30 minutes  04/25/2014, 10:24 AM   Cherene Altes, MD [facility] Office  [phone] Pager [phone]  On-Call/Text Page:      [email or web address]       password [facility]

## 2014-04-25 NOTE — Progress Notes (Signed)
Pt discharged home per MD order. No meds given per patient. Discharge instructions provided, spoke at length to patient and wife about getting a PCP. as soon as possible. PIV removed. CMT notified.

## 2014-04-25 NOTE — Discharge Instructions (Signed)
Alcohol Use Disorder  Alcohol use disorder is a mental disorder. It is not a one-time incident of heavy drinking. Alcohol use disorder is the excessive and uncontrollable use of alcohol over time that leads to problems with functioning in one or more areas of daily living. People with this disorder risk harming themselves and others when they drink to excess. Alcohol use disorder also can cause other mental disorders, such as mood and anxiety disorders, and serious physical problems. People with alcohol use disorder often misuse other drugs.  Alcohol use disorder is common and widespread. Some people with this disorder drink alcohol to cope with or escape from negative life events. Others drink to relieve chronic pain or symptoms of mental illness. People with a family history of alcohol use disorder are at higher risk of losing control and using alcohol to excess.  SYMPTOMS  Signs and symptoms of alcohol use disorder may include the following:   Consumption ofalcohol inlarger amounts or over a longer period of time than intended.  Multiple unsuccessful attempts to cutdown or control alcohol use.   A great deal of time spent obtaining alcohol, using alcohol, or recovering from the effects of alcohol (hangover).  A strong desire or urge to use alcohol (cravings).   Continued use of alcohol despite problems at work, school, or home because of alcohol use.   Continued use of alcohol despite problems in relationships because of alcohol use.  Continued use of alcohol in situations when it is physically hazardous, such as driving a car.  Continued use of alcohol despite awareness of a physical or psychological problem that is likely related to alcohol use. Physical problems related to alcohol use can involve the brain, heart, liver, stomach, and intestines. Psychological problems related to alcohol use include intoxication, depression, anxiety, psychosis, delirium, and dementia.   The need  for increased amounts of alcohol to achieve the same desired effect, or a decreased effect from the consumption of the same amount of alcohol (tolerance).  Withdrawal symptoms upon reducing or stopping alcohol use, or alcohol use to reduce or avoid withdrawal symptoms. Withdrawal symptoms include:  Racing heart.  Hand tremor.  Difficulty sleeping.  Nausea.  Vomiting.  Hallucinations.  Restlessness.  Seizures. DIAGNOSIS Alcohol use disorder is diagnosed through an assessment by your health care provider. Your health care provider may start by asking three or four questions to screen for excessive or problematic alcohol use. To confirm a diagnosis of alcohol use disorder, at least two symptoms must be present within a 64-month period. The severity of alcohol use disorder depends on the number of symptoms:  Mild--two or three.  Moderate--four or five.  Severe--six or more. Your health care provider may perform a physical exam or use results from lab tests to see if you have physical problems resulting from alcohol use. Your health care provider may refer you to a mental health professional for evaluation. TREATMENT  Some people with alcohol use disorder are able to reduce their alcohol use to low-risk levels. Some people with alcohol use disorder need to quit drinking alcohol. When necessary, mental health professionals with specialized training in substance use treatment can help. Your health care provider can help you decide how severe your alcohol use disorder is and what type of treatment you need. The following forms of treatment are available:   Detoxification. Detoxification involves the use of prescription medicines to prevent alcohol withdrawal symptoms in the first week after quitting. This is important for people with a history of  symptoms of withdrawal and for heavy drinkers who are likely to have withdrawal symptoms. Alcohol withdrawal can be dangerous and, in severe cases,  cause death. Detoxification is usually provided in a hospital or in-patient substance use treatment facility.  Counseling or talk therapy. Talk therapy is provided by substance use treatment counselors. It addresses the reasons people use alcohol and ways to keep them from drinking again. The goals of talk therapy are to help people with alcohol use disorder find healthy activities and ways to cope with life stress, to identify and avoid triggers for alcohol use, and to handle cravings, which can cause relapse.  Medicines.Different medicines can help treat alcohol use disorder through the following actions:  Decrease alcohol cravings.  Decrease the positive reward response felt from alcohol use.  Produce an uncomfortable physical reaction when alcohol is used (aversion therapy).  Support groups. Support groups are run by people who have quit drinking. They provide emotional support, advice, and guidance. These forms of treatment are often combined. Some people with alcohol use disorder benefit from intensive combination treatment provided by specialized substance use treatment centers. Both inpatient and outpatient treatment programs are available. Document Released: 03/31/2004 Document Revised: 07/08/2013 Document Reviewed: 05/31/2012 [facility] Patient Information 2015 [place], [place]. This information is not intended to replace advice given to you by your health care provider. Make sure you discuss any questions you have with your health care provider.   Alcohol and Nutrition Nutrition serves two purposes. It provides energy. It also maintains body structure and function. Food supplies energy. It also provides the building blocks needed to replace worn or damaged cells. Alcoholics often eat poorly. This limits their supply of essential nutrients. This affects energy supply and structure maintenance. Alcohol also affects the body's nutrients in:  Digestion.  Storage.  Using and getting rid  of waste products. IMPAIRMENT OF NUTRIENT DIGESTION AND UTILIZATION   Once ingested, food must be broken down into small components (digested). Then it is available for energy. It helps maintain body structure and function. Digestion begins in the mouth. It continues in the stomach and intestines, with help from the pancreas. The nutrients from digested food are absorbed from the intestines into the blood. Then they are carried to the liver. The liver prepares nutrients for:  Immediate use.  Storage and future use.  Alcohol inhibits the breakdown of nutrients into usable molecules.  It decreases secretion of digestive enzymes from the pancreas.  Alcohol impairs nutrient absorption by damaging the cells lining the stomach and intestines.  It also interferes with moving some nutrients into the blood.  In addition, nutritional deficiencies themselves may lead to further absorption problems.  For example, folate deficiency changes the cells that line the small intestine. This impairs how water is absorbed. It also affects absorbed nutrients. These include glucose, sodium, and additional folate.  Even if nutrients are digested and absorbed, alcohol can prevent them from being fully used. It changes their transport, storage, and excretion. Impaired utilization of nutrients by alcoholics is indicated by:  Decreased liver stores of vitamins, such as vitamin A.  Increased excretion of nutrients such as fat. ALCOHOL AND ENERGY SUPPLY   Three basic nutritional components found in food are:  Carbohydrates.  Proteins.  Fats.  These are used as energy. Some alcoholics take in as much as 50% of their total daily calories from alcohol. They often neglect important foods.  Even when enough food is eaten, alcohol can impair the ways the body controls blood sugar (glucose)  levels. It may either increase or decrease blood sugar.  In non-diabetic alcoholics, increased blood sugar (hyperglycemia)  is caused by poor insulin secretion. It is usually temporary.  Decreased blood sugar (hypoglycemia) can cause serious injury even if this condition is short-lived. Low blood sugar can happen when a fasting or malnourished person drinks alcohol. When there is no food to supply energy, stored sugar is used up. The products of alcohol inhibit forming glucose from other compounds such as amino acids. As a result, alcohol causes the brain and other body tissue to lack glucose. It is needed for energy and function.  Alcohol is an energy source. But how the body processes and uses the energy from alcohol is complex. Also, when alcohol is substituted for carbohydrates, subjects tend to lose weight. This indicates that they get less energy from alcohol than from food. ALCOHOL - MAINTAINING CELL STRUCTURE AND FUNCTION  Structure Cells are made mostly of protein. So an adequate protein diet is important for maintaining cell structure. This is especially true if cells are being damaged. Research indicates that alcohol affects protein nutrition by causing impaired:  Digestion of proteins to amino acids.  Processing of amino acids by the small intestine and liver.  Synthesis of proteins from amino acids.  Protein secretion by the liver. Function Nutrients are essential for the body to function well. They provide the tools that the body needs to work well:   Proteins.  Vitamins.  Minerals. Alcohol can disrupt body function. It may cause nutrient deficiencies. And it may interfere with the way nutrients are processed. Vitamins  Vitamins are essential to maintain growth and normal metabolism. They regulate many of the body`s processes. Chronic heavy drinking causes deficiencies in many vitamins. This is caused by eating less. And, in some cases, vitamins may be poorly absorbed. For example, alcohol inhibits fat absorption. It impairs how the vitamins A, E, and D are normally absorbed along with dietary  fats. Not enough vitamin A may cause night blindness. Not enough vitamin D may cause softening of the bones.  Some alcoholics lack vitamins A, C, D, E, K, and the B vitamins. These are all involved in wound healing and cell maintenance. In particular, because vitamin K is necessary for blood clotting, lacking that vitamin can cause delayed clotting. The result is excess bleeding. Lacking other vitamins involved in brain function may cause severe neurological damage. Minerals Deficiencies of minerals such as calcium, magnesium, iron, and zinc are common in alcoholics. The alcohol itself does not seem to affect how these minerals are absorbed. Rather, they seem to occur secondary to other alcohol-related problems, such as:  Less calcium absorbed.  Not enough magnesium.  More urinary excretion.  Vomiting.  Diarrhea.  Not enough iron due to gastrointestinal bleeding.  Not enough zinc or losses related to other nutrient deficiencies.  Mineral deficiencies can cause a variety of medical consequences. These range from calcium-related bone disease to zinc-related night blindness and skin lesions. ALCOHOL, MALNUTRITION, AND MEDICAL COMPLICATIONS  Liver Disease   Alcoholic liver damage is caused primarily by alcohol itself. But poor nutrition may increase the risk of alcohol-related liver damage. For example, nutrients normally found in the liver are known to be affected by drinking alcohol. These include carotenoids, which are the major sources of vitamin A, and vitamin E compounds. Decreases in such nutrients may play some role in alcohol-related liver damage. Pancreatitis  Research suggests that malnutrition may increase the risk of developing alcoholic pancreatitis. Research suggests that a  diet lacking in protein may increase alcohol's damaging effect on the pancreas. Brain  Nutritional deficiencies may have severe effects on brain function. These may be permanent. Specifically, thiamine  deficiencies are often seen in alcoholics. They can cause severe neurological problems. These include:  Impaired movement.  Memory loss seen in Wernicke-Korsakoff syndrome. Pregnancy  Alcohol has toxic effects on fetal development. It causes alcohol-related birth defects. They include fetal alcohol syndrome. Alcohol itself is toxic to the fetus. Also, the nutritional deficiency can affect how the fetus develops. That may compound the risk of developmental damage.  Nutritional needs during pregnancy are 10% to 30% greater than normal. Food intake can increase by as much as 140% to cover the needs of both mother and fetus. An alcoholic mother`s nutritional problems may adversely affect the nutrition of the fetus. And alcohol itself can also restrict nutrition flow to the fetus. NUTRITIONAL STATUS OF ALCOHOLICS  Techniques for assessing nutritional status include:  Taking body measurements to estimate fat reserves. They include:  Weight.  Height.  Mass.  Skin fold thickness.  Performing blood analysis to provide measurements of circulating:  Proteins.  Vitamins.  Minerals.  These techniques tend to be imprecise. For many nutrients, there is no clear "cut-off" point that would allow an accurate definition of deficiency. So assessing the nutritional status of alcoholics is limited by these techniques. Dietary status may provide information about the risk of developing nutritional problems. Dietary status is assessed by:  Taking patients' dietary histories.  Evaluating the amount and types of food they are eating.  It is difficult to determine what exact amount of alcohol begins to have damaging effects on nutrition. In general, moderate drinkers have 2 drinks or less per day. They seem to be at little risk for nutritional problems. Various medical disorders begin to appear at greater levels.  Research indicates that the majority of even the heaviest drinkers have few obvious  nutritional deficiencies. Many alcoholics who are hospitalized for medical complications of their disease do have severe malnutrition. Alcoholics tend to eat poorly. Often they eat less than the amounts of food necessary to provide enough:  Carbohydrates.  Protein.  Fat.  Vitamins A and C.  B vitamins.  Minerals like calcium and iron. Of major concern is alcohol's effect on digesting food and use of nutrients. It may shift a mildly malnourished person toward severe malnutrition. Document Released: 12/16/2004 Document Revised: 05/16/2011 Document Reviewed: 06/01/2005 [facility] Patient Information 2015 [place], [place]. This information is not intended to replace advice given to you by your health care provider. Make sure you discuss any questions you have with your health care provider.

## 2014-04-28 LAB — INSULIN, RANDOM: INSULIN: 3 u[IU]/mL (ref 3–28)

## 2014-05-02 LAB — SULFONYLUREA HYPOGLYCEMICS PANEL, SERUM
Acetohexamide: NOT DETECTED
CHLORPROPAMIDE: NOT DETECTED
Glimepiride: NOT DETECTED
Glipizide: NOT DETECTED
Glyburide: NOT DETECTED
Nateglinide: NOT DETECTED
REPAGLINIDE: NOT DETECTED
TOLAZAMIDE: NOT DETECTED
Tolbutamide: NOT DETECTED

## 2014-06-24 ENCOUNTER — Emergency Department (HOSPITAL_COMMUNITY)
Admission: EM | Admit: 2014-06-24 | Discharge: 2014-06-24 | Discharge status: Against Medical Advice | Payer: Medicaid Other | Attending: Emergency Medicine | Admitting: Emergency Medicine

## 2014-06-24 DIAGNOSIS — E162 Hypoglycemia, unspecified: Secondary | ICD-10-CM | POA: Diagnosis present

## 2014-06-24 LAB — CBG MONITORING, ED: Glucose-Capillary: 92 mg/dL (ref 70–99)

## 2014-06-24 NOTE — ED Notes (Signed)
Checked patient blood sugar it was 92 notified rn of blood sugar

## 2014-06-24 NOTE — ED Notes (Addendum)
Pt states that he felt weak and nauseated this morning. Pt states that he took his CBG and it was 48. Pt states that he took 1 glucose tablet and it increased to 50's. Pt then took a second tablet.  Pt states that he has felt "shakey". Pt CBG at triage was 92.

## 2014-06-24 NOTE — ED Notes (Signed)
Unable to locate when called for room 

## 2014-09-16 ENCOUNTER — Inpatient Hospital Stay (HOSPITAL_COMMUNITY)
Admission: EM | Admit: 2014-09-16 | Discharge: 2014-09-18 | DRG: 641 | Disposition: A | Discharge status: Home / Self Care | Payer: Medicaid Other | Attending: Internal Medicine | Admitting: Internal Medicine

## 2014-09-16 ENCOUNTER — Encounter (HOSPITAL_COMMUNITY): Payer: Self-pay | Admitting: Emergency Medicine

## 2014-09-16 DIAGNOSIS — F10939 Alcohol use, unspecified with withdrawal, unspecified: Secondary | ICD-10-CM | POA: Diagnosis present

## 2014-09-16 DIAGNOSIS — F4310 Post-traumatic stress disorder, unspecified: Secondary | ICD-10-CM | POA: Diagnosis present

## 2014-09-16 DIAGNOSIS — D72829 Elevated white blood cell count, unspecified: Secondary | ICD-10-CM | POA: Diagnosis present

## 2014-09-16 DIAGNOSIS — Y905 Blood alcohol level of 100-119 mg/100 ml: Secondary | ICD-10-CM | POA: Diagnosis present

## 2014-09-16 DIAGNOSIS — F329 Major depressive disorder, single episode, unspecified: Secondary | ICD-10-CM | POA: Diagnosis present

## 2014-09-16 DIAGNOSIS — Z681 Body mass index (BMI) 19 or less, adult: Secondary | ICD-10-CM | POA: Diagnosis not present

## 2014-09-16 DIAGNOSIS — E872 Acidosis, unspecified: Secondary | ICD-10-CM

## 2014-09-16 DIAGNOSIS — F419 Anxiety disorder, unspecified: Secondary | ICD-10-CM | POA: Diagnosis present

## 2014-09-16 DIAGNOSIS — F418 Other specified anxiety disorders: Secondary | ICD-10-CM | POA: Insufficient documentation

## 2014-09-16 DIAGNOSIS — F10239 Alcohol dependence with withdrawal, unspecified: Secondary | ICD-10-CM | POA: Diagnosis present

## 2014-09-16 DIAGNOSIS — E162 Hypoglycemia, unspecified: Secondary | ICD-10-CM | POA: Diagnosis not present

## 2014-09-16 DIAGNOSIS — F17220 Nicotine dependence, chewing tobacco, uncomplicated: Secondary | ICD-10-CM | POA: Diagnosis present

## 2014-09-16 DIAGNOSIS — E860 Dehydration: Secondary | ICD-10-CM | POA: Diagnosis present

## 2014-09-16 DIAGNOSIS — K219 Gastro-esophageal reflux disease without esophagitis: Secondary | ICD-10-CM | POA: Diagnosis present

## 2014-09-16 DIAGNOSIS — F10230 Alcohol dependence with withdrawal, uncomplicated: Secondary | ICD-10-CM | POA: Diagnosis not present

## 2014-09-16 DIAGNOSIS — E440 Moderate protein-calorie malnutrition: Secondary | ICD-10-CM | POA: Diagnosis present

## 2014-09-16 DIAGNOSIS — R55 Syncope and collapse: Secondary | ICD-10-CM | POA: Diagnosis present

## 2014-09-16 DIAGNOSIS — F1010 Alcohol abuse, uncomplicated: Secondary | ICD-10-CM | POA: Diagnosis present

## 2014-09-16 DIAGNOSIS — T730XXA Starvation, initial encounter: Secondary | ICD-10-CM

## 2014-09-16 DIAGNOSIS — E869 Volume depletion, unspecified: Secondary | ICD-10-CM

## 2014-09-16 LAB — CBC
HCT: 40.2 % (ref 39.0–52.0)
Hemoglobin: 13.5 g/dL (ref 13.0–17.0)
MCH: 32.1 pg (ref 26.0–34.0)
MCHC: 33.6 g/dL (ref 30.0–36.0)
MCV: 95.7 fL (ref 78.0–100.0)
Platelets: 170 10*3/uL (ref 150–400)
RBC: 4.2 MIL/uL — ABNORMAL LOW (ref 4.22–5.81)
RDW: 12.7 % (ref 11.5–15.5)
WBC: 19.3 10*3/uL — ABNORMAL HIGH (ref 4.0–10.5)

## 2014-09-16 LAB — CBG MONITORING, ED
Glucose-Capillary: 61 mg/dL — ABNORMAL LOW (ref 65–99)
Glucose-Capillary: 218 mg/dL — ABNORMAL HIGH (ref 65–99)

## 2014-09-16 LAB — URINALYSIS, ROUTINE W REFLEX MICROSCOPIC
BILIRUBIN URINE: NEGATIVE
KETONES UR: 40 mg/dL — AB
Leukocytes, UA: NEGATIVE
Nitrite: NEGATIVE
PH: 5.5 (ref 5.0–8.0)
PROTEIN: NEGATIVE mg/dL
Specific Gravity, Urine: 1.02 (ref 1.005–1.030)
Urobilinogen, UA: 0.2 mg/dL (ref 0.0–1.0)

## 2014-09-16 LAB — URINE MICROSCOPIC-ADD ON

## 2014-09-16 LAB — COMPREHENSIVE METABOLIC PANEL
ALT: 88 U/L — ABNORMAL HIGH (ref 17–63)
ANION GAP: 24 — AB (ref 5–15)
AST: 141 U/L — ABNORMAL HIGH (ref 15–41)
Albumin: 4.8 g/dL (ref 3.5–5.0)
Alkaline Phosphatase: 48 U/L (ref 38–126)
BUN: 11 mg/dL (ref 6–20)
CHLORIDE: 103 mmol/L (ref 101–111)
CO2: 16 mmol/L — AB (ref 22–32)
Calcium: 8.6 mg/dL — ABNORMAL LOW (ref 8.9–10.3)
Creatinine, Ser: 0.81 mg/dL (ref 0.61–1.24)
GFR calc non Af Amer: >60 mL/min (ref >60)
GLUCOSE: 61 mg/dL — AB (ref 65–99)
Potassium: 4.5 mmol/L (ref 3.5–5.1)
Sodium: 143 mmol/L (ref 135–145)
TOTAL PROTEIN: 7.3 g/dL (ref 6.5–8.1)
Total Bilirubin: 1.3 mg/dL — ABNORMAL HIGH (ref 0.3–1.2)

## 2014-09-16 LAB — ETHANOL: ALCOHOL ETHYL (B): 104 mg/dL — AB (ref <5)

## 2014-09-16 LAB — RAPID URINE DRUG SCREEN, HOSP PERFORMED
AMPHETAMINES: NOT DETECTED
Barbiturates: NOT DETECTED
Benzodiazepines: NOT DETECTED
Cocaine: NOT DETECTED
OPIATES: NOT DETECTED
TETRAHYDROCANNABINOL: NOT DETECTED

## 2014-09-16 MED ORDER — LORAZEPAM 1 MG PO TABS
0.0000 mg | ORAL_TABLET | Freq: Four times a day (QID) | ORAL | Status: DC
  Administered 2014-09-16: 2 mg via ORAL
  Filled 2014-09-16: qty 2

## 2014-09-16 MED ORDER — ENSURE ENLIVE PO LIQD
237.0000 mL | Freq: Two times a day (BID) | ORAL | Status: DC
  Administered 2014-09-17 – 2014-09-18 (×3): 237 mL via ORAL

## 2014-09-16 MED ORDER — SODIUM CHLORIDE 0.9 % IV SOLN
INTRAVENOUS | Status: AC
  Administered 2014-09-16: 18:00:00 via INTRAVENOUS

## 2014-09-16 MED ORDER — ONDANSETRON HCL 4 MG/2ML IJ SOLN
4.0000 mg | Freq: Once | INTRAMUSCULAR | Status: AC
  Administered 2014-09-16: 4 mg via INTRAVENOUS
  Filled 2014-09-16: qty 2

## 2014-09-16 MED ORDER — CETYLPYRIDINIUM CHLORIDE 0.05 % MT LIQD
7.0000 mL | Freq: Two times a day (BID) | OROMUCOSAL | Status: DC
  Administered 2014-09-16 – 2014-09-17 (×2): 7 mL via OROMUCOSAL

## 2014-09-16 MED ORDER — SODIUM CHLORIDE 0.9 % IV BOLUS (SEPSIS)
1000.0000 mL | Freq: Once | INTRAVENOUS | Status: AC
  Administered 2014-09-16: 1000 mL via INTRAVENOUS

## 2014-09-16 MED ORDER — CHLORHEXIDINE GLUCONATE 0.12 % MT SOLN
15.0000 mL | Freq: Two times a day (BID) | OROMUCOSAL | Status: DC
  Administered 2014-09-16 – 2014-09-17 (×3): 15 mL via OROMUCOSAL
  Filled 2014-09-16 (×4): qty 15

## 2014-09-16 MED ORDER — LORAZEPAM 1 MG PO TABS: 0.0000 mg | ORAL_TABLET | Freq: Two times a day (BID) | ORAL | Status: DC

## 2014-09-16 MED ORDER — ENOXAPARIN SODIUM 40 MG/0.4ML ~~LOC~~ SOLN
40.0000 mg | SUBCUTANEOUS | Status: DC
  Administered 2014-09-16 – 2014-09-17 (×2): 40 mg via SUBCUTANEOUS
  Filled 2014-09-16 (×2): qty 0.4

## 2014-09-16 MED ORDER — PANTOPRAZOLE SODIUM 40 MG IV SOLR
40.0000 mg | INTRAVENOUS | Status: DC
  Administered 2014-09-17 – 2014-09-18 (×2): 40 mg via INTRAVENOUS
  Filled 2014-09-16 (×2): qty 40

## 2014-09-16 MED ORDER — PANTOPRAZOLE SODIUM 40 MG IV SOLR
40.0000 mg | Freq: Once | INTRAVENOUS | Status: AC
  Administered 2014-09-16: 40 mg via INTRAVENOUS
  Filled 2014-09-16: qty 40

## 2014-09-16 MED ORDER — DEXTROSE 50 % IV SOLN
1.0000 | Freq: Once | INTRAVENOUS | Status: AC
  Administered 2014-09-16: 50 mL via INTRAVENOUS
  Filled 2014-09-16: qty 50

## 2014-09-16 NOTE — ED Notes (Signed)
Gave pt sandwich. Has kept down a cup of ginger ale

## 2014-09-16 NOTE — H&P (Signed)
[facility] History and Physical  Samuel [name] JJK:[id number] DOB: June 29, 1980 DOA: 09/16/2014  Referring physician: EDP PCP: Samuel Roers, MD   Chief Complaint: syncopal episode earlier this am.  HPI: Samuel [name] is a 34 y.o. male with h/o alcohol abuse, multiple admissions in the past for alcohol ketoacidosis and hypoglycemia, was brought in by EMS after a syncopal episode. As per teh patient, he woke up at 5 am, but was feeling weak and fatigued, he checked his blood sugar and was found to be 39, he reports taking some glucose tabs and going back to bed. Reports waking up at 8 and had a syncopal episode . He wasn't sure how long he was unconsciousness. Syncopal episode was unwitnessed. He was still feeling dizzy, and near syncopal when his mom called EMS around 36. When he got to ED, his cbg was 61 and labs revealed elevated alcohol level, anion gap metabolic acidosis and moderate leukocytosis of 19,000.he was referred to medical service for admission for the above.     Review of Systems:  Constitutional:  , fatigued.  HEENT:  No headaches, Difficulty swallowing,Tooth/dental problems,Sore throat,  No sneezing, itching, ear ache, nasal congestion, post nasal drip,  Cardio-vascular:  syncope, dizziness, palpitations  GI:  Nausea, vomiting . Resp:  No shortness of breath with exertion or at rest. No excess mucus, no productive cough, No non-productive cough, No coughing up of blood.No change in color of mucus.No wheezing.No chest wall deformity  Skin:  no rash or lesions.  GU:  no dysuria, change in color of urine, no urgency or frequency. No flank pain.  Musculoskeletal:  No joint pain or swelling. No decreased range of motion. No back pain.  Psych:  No change in mood or affect. No depression or anxiety. No memory loss.   Past Medical History  Diagnosis Date  . GERD (gastroesophageal reflux disease)   . H/O hiatal hernia     pt states he had an EGD at age 46 and had a  hiatal hernia  . Dysphagia   . Diverticulitis of colon with bleeding 2010    Hospitalized in 2010 for "intestinal infection requiring IV antibiotics"  . PTSD (post-traumatic stress disorder)   . Alcohol abuse   . Hypoglycemia     recurrent  . Elevated LFTs    Past Surgical History  Procedure Laterality Date  . Tonsillectomy    . Esophagogastroduodenoscopy      age 51  . Colonoscopy  05/15/2011    Procedure: COLONOSCOPY;  Surgeon: Irene Shipper, MD;  Location: [place];  Service: Endoscopy;  Laterality: N/A;  . Esophagogastroduodenoscopy  05/15/2011    Procedure: ESOPHAGOGASTRODUODENOSCOPY (EGD);  Surgeon: Irene Shipper, MD;  [facility] ENDOSCOPY;  Service: Endoscopy;  Laterality: N/A;  . Givens capsule study  05/30/2011    Procedure: GIVENS CAPSULE STUDY;  Surgeon: Irene Shipper, MD;  Location: [place];  Service: Endoscopy;  Laterality: N/A;  Givens Capsule study.   Social History:  reports that he has never smoked. His smokeless tobacco use includes Chew. He reports that he drinks about 1.8 oz of alcohol per week. He reports that he does not use illicit drugs.  No Known Allergies  Family History  Problem Relation Age of Onset  . Diabetes type II Mother   . Emphysema Father   . Heart attack Father   . Stroke Father   . Diabetes type II Other   . Diabetes type II Maternal Aunt     do not leave blank  Prior to Admission medications   Medication Sig Start Date End Date Taking? Authorizing Provider  ibuprofen (ADVIL,MOTRIN) 200 MG tablet Take 200 mg by mouth every 6 (six) hours as needed for moderate pain.    Yes Historical Provider, MD  ranitidine (ZANTAC) 150 MG tablet Take 150 mg by mouth daily as needed for heartburn.   Yes Historical Provider, MD  famotidine (PEPCID) 10 MG tablet Take 1 tablet (10 mg total) by mouth daily. Patient not taking: Reported on 06/24/2014 04/10/14   Nita Sells, MD   Physical Exam: Filed Vitals:   09/16/14 1624 09/16/14 1634 09/16/14  1727 09/16/14 1811  BP:  133/88 130/75 122/77  Pulse:  110 102 115  Temp: 98.4 F (36.9 C)     TempSrc: Oral     Resp:   16   Height:    5\' 7"  (1.702 m)  Weight:    50.9 kg (112 lb 3.4 oz)  SpO2:   96% 99%    Wt Readings from Last 3 Encounters:  09/16/14 50.9 kg (112 lb 3.4 oz)  06/24/14 53.343 kg (117 lb 9.6 oz)  04/25/14 51.5 kg (113 lb 8.6 oz)    General:  Appears anxious. Cachetic looking.  Eyes: PERRL, normal lids, irises & conjunctiva Neck: no LAD, masses or thyromegaly Cardiovascular: RRR, no m/r/g. No LE edema. Telemetry: Sinus tachycardia.  Respiratory: CTA bilaterally, no w/r/r. Normal respiratory effort. Abdomen: soft, ntnd Skin: no rash or induration seen on limited exam Musculoskeletal: grossly normal tone BUE/BLE Psychiatric: depressed. Neurologic: grossly non-focal.          Labs on Admission:  Basic Metabolic Panel:  Recent Labs Lab 09/16/14 1322  NA 143  K 4.5  CL 103  CO2 16*  GLUCOSE 61*  BUN 11  CREATININE 0.81  CALCIUM 8.6*   Liver Function Tests:  Recent Labs Lab 09/16/14 1322  AST 141*  ALT 88*  ALKPHOS 48  BILITOT 1.3*  PROT 7.3  ALBUMIN 4.8   No results for input(s): LIPASE, AMYLASE in the last 168 hours. No results for input(s): AMMONIA in the last 168 hours. CBC:  Recent Labs Lab 09/16/14 1322  WBC 19.3*  HGB 13.5  HCT 40.2  MCV 95.7  PLT 170   Cardiac Enzymes: No results for input(s): CKTOTAL, CKMB, CKMBINDEX, TROPONINI in the last 168 hours.  BNP (last 3 results) No results for input(s): BNP in the last 8760 hours.  ProBNP (last 3 results) No results for input(s): PROBNP in the last 8760 hours.  CBG:  Recent Labs Lab 09/16/14 1321 09/16/14 1537  GLUCAP 61* 218*    Radiological Exams on Admission: No results found.  EKG: reviewed and shows sinus tachycardia.   Assessment/Plan Active Problems:   Hypoglycemia   Alcohol withdrawal    anion gap metabolic acidosis from alcohol  abuse: Hydration.  And repeat labs in am.   Alcohol abuse  Watch for withdrawal symptoms.  On CIWA.    ELEVATED liver function tests: - possibly from alcohol induced .  - hydrate.    Depression and anxiety: - will get psychiatry involved.   Hypoglycemia: - probably alcohol induced and decreased po intake.    Leukocytosis: Reactive,  Get UA,    Syncopal episode: - probably from hypoglycemia and dehydration.  Hydrate .   Nausea, vomiting: Probably from alcohol induced gastritis.  Symptomatic managemetn with anti emetics and protonix IV.   Code Status: full code.  DVT Prophylaxis: Family Communication: mom at bedside.  Disposition Plan: pending  further management  Time spent: 65 min  [facility] Pager [phone]

## 2014-09-16 NOTE — ED Notes (Addendum)
Per EMS. Pt from home. Reports a syncopal episode this am at 0800 when he stood up out of a chair. Denies any injury. Has had n/v ever since. Dry heaves with EMS. Pt has not hx of DM, but states his cbg was in the 40s this am. zofran given by EMS

## 2014-09-16 NOTE — ED Notes (Signed)
Bed: WA08 Expected date:  Expected time:  Means of arrival:  Comments: ems 

## 2014-09-16 NOTE — ED Provider Notes (Signed)
CSN: [id number]     Arrival date & time 09/16/14  1240 History   First MD Initiated Contact with Patient 09/16/14 1301     Chief Complaint  Patient presents with  . Loss of Consciousness  . Emesis     (Consider location/radiation/quality/duration/timing/severity/associated sxs/prior Treatment) Patient is a 34 y.o. male presenting with syncope and vomiting. The history is provided by the patient.  Loss of Consciousness Associated symptoms: vomiting   Associated symptoms: no chest pain, no confusion, no fever, no headaches, no shortness of breath and no weakness   Emesis Associated symptoms: no abdominal pain, no diarrhea, no headaches and no sore throat   Patient c/o episode feeling faint/dizzy this morning.  Patient indicates hx recurrent hypoglycemia.  Pt indicates has seen pcp for same, and has home glucometer. States checked blood sugar this AM and read low. Pt indicates when he feels stressed, his etoh intake will increase, and that he has been told low blood sugars possibly related to etoh use/abuse. Pt denies daily/heavy use. 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  Pt denies recent unusual stressors or anxiety. Denies depression. Pt states per normal routine, he got up around 5 AM today. He states in living room of his home, he began to feel sugar was low, checked and it read low, stood up and got faint feeling, ?brief syncopal event - pt denies injury. States he was able to go into bedroom and ask significant other to call ems.      Past Medical History  Diagnosis Date  . GERD (gastroesophageal reflux disease)   . H/O hiatal hernia     pt states he had an EGD at age 79 and had a hiatal hernia  . Dysphagia   . Diverticulitis of colon with bleeding 2010    Hospitalized in 2010 for "intestinal infection requiring IV antibiotics"  . PTSD (post-traumatic stress disorder)   . Alcohol abuse   . Hypoglycemia     recurrent   . Elevated LFTs    Past Surgical History  Procedure Laterality Date  . Tonsillectomy    . Esophagogastroduodenoscopy      age 84  . Colonoscopy  05/15/2011    Procedure: COLONOSCOPY;  Surgeon: Irene Shipper, MD;  Location: [place];  Service: Endoscopy;  Laterality: N/A;  . Esophagogastroduodenoscopy  05/15/2011    Procedure: ESOPHAGOGASTRODUODENOSCOPY (EGD);  Surgeon: Irene Shipper, MD;  Location: [facility] ENDOSCOPY;  Service: Endoscopy;  Laterality: N/A;  . Givens capsule study  05/30/2011    Procedure: GIVENS CAPSULE STUDY;  Surgeon: Irene Shipper, MD;  Location: [place];  Service: Endoscopy;  Laterality: N/A;  Givens Capsule study.   Family History  Problem Relation Age of Onset  . Diabetes type II Mother   . Emphysema Father   . Heart attack Father   . Stroke Father   . Diabetes type II Other   . Diabetes type II Maternal Aunt    History  Substance Use Topics  . Smoking status: Never Smoker   . Smokeless tobacco: Current User    Types: Chew     Comment: States he has been chewing tobacco almost daily since age 26  . Alcohol Use: 1.8 oz/week    3 Cans of beer per week     Comment: average six 12oz beers/day, ranges from 3-12 12oz/day    Review of Systems  Constitutional: Negative for fever.  HENT: Negative for sore throat.   Eyes:  Negative for visual disturbance.  Respiratory: Negative for shortness of breath.   Cardiovascular: Positive for syncope. Negative for chest pain.  Gastrointestinal: Positive for vomiting. Negative for abdominal pain and diarrhea.  Endocrine: Negative for polyuria.  Genitourinary: Negative for dysuria and flank pain.  Musculoskeletal: Negative for back pain and neck pain.  Skin: Negative for rash.  Neurological: Negative for weakness, numbness and headaches.  Hematological: Does not bruise/bleed easily.  Psychiatric/Behavioral: Negative for confusion.      Allergies  Review of patient's allergies indicates no known allergies.  Home  Medications   Prior to Admission medications   Medication Sig Start Date End Date Taking? Authorizing Provider  ibuprofen (ADVIL,MOTRIN) 200 MG tablet Take 200 mg by mouth every 6 (six) hours as needed for moderate pain.    Yes Historical Provider, MD  ranitidine (ZANTAC) 150 MG tablet Take 150 mg by mouth daily as needed for heartburn.   Yes Historical Provider, MD  famotidine (PEPCID) 10 MG tablet Take 1 tablet (10 mg total) by mouth daily. Patient not taking: Reported on 06/24/2014 04/10/14   Nita Sells, MD   BP 126/63 mmHg  Pulse 116  Temp(Src) 98.1 F (36.7 C) (Oral)  Resp 20  SpO2 96% Physical Exam  Constitutional: He is oriented to person, place, and time. He appears well-developed and well-nourished. No distress.  HENT:  Mouth/Throat: Oropharynx is clear and moist.  Eyes: Conjunctivae are normal. Pupils are equal, round, and reactive to light. No scleral icterus.  Neck: Normal range of motion. Neck supple. No tracheal deviation present. No thyromegaly present.  Cardiovascular: Normal rate, regular rhythm, normal heart sounds and intact distal pulses.   Pulmonary/Chest: Effort normal and breath sounds normal. No accessory muscle usage. No respiratory distress.  Abdominal: Soft. Bowel sounds are normal. He exhibits no distension.  Musculoskeletal: Normal range of motion. He exhibits no edema or tenderness.  CTLS spine, non tender, aligned, no step off. Good rom bil ext without pain or focal bony tenderness.   Neurological: He is alert and oriented to person, place, and time.  Motor intact bil, stre 5/5. sens grossly intact.   Skin: Skin is warm and dry. He is not diaphoretic.  Psychiatric: He has a normal mood and affect.  Nursing note and vitals reviewed.   ED Course  Procedures (including critical care time) Labs Review  Results for orders placed or performed during the hospital encounter of 09/16/14  CBC  Result Value Ref Range   WBC 19.3 (H) 4.0 - 10.5 K/uL    RBC 4.20 (L) 4.22 - 5.81 MIL/uL   Hemoglobin 13.5 13.0 - 17.0 g/dL   HCT 40.2 39.0 - 52.0 %   MCV 95.7 78.0 - 100.0 fL   MCH 32.1 26.0 - 34.0 pg   MCHC 33.6 30.0 - 36.0 g/dL   RDW 12.7 11.5 - 15.5 %   Platelets 170 150 - 400 K/uL  Comprehensive metabolic panel  Result Value Ref Range   Sodium 143 135 - 145 mmol/L   Potassium 4.5 3.5 - 5.1 mmol/L   Chloride 103 101 - 111 mmol/L   CO2 16 (L) 22 - 32 mmol/L   Glucose, Bld 61 (L) 65 - 99 mg/dL   BUN 11 6 - 20 mg/dL   Creatinine, Ser 0.81 0.61 - 1.24 mg/dL   Calcium 8.6 (L) 8.9 - 10.3 mg/dL   Total Protein 7.3 6.5 - 8.1 g/dL   Albumin 4.8 3.5 - 5.0 g/dL   AST 141 (H) 15 - 41  U/L   ALT 88 (H) 17 - 63 U/L   Alkaline Phosphatase 48 38 - 126 U/L   Total Bilirubin 1.3 (H) 0.3 - 1.2 mg/dL   GFR calc non Af Amer >60 >60 mL/min   GFR calc Af Amer >60 >60 mL/min   Anion gap 24 (H) 5 - 15  Ethanol  Result Value Ref Range   Alcohol, Ethyl (B) 104 (H) <5 mg/dL  Urine rapid drug screen (hosp performed)  Result Value Ref Range   Opiates NONE DETECTED NONE DETECTED   Cocaine NONE DETECTED NONE DETECTED   Benzodiazepines NONE DETECTED NONE DETECTED   Amphetamines NONE DETECTED NONE DETECTED   Tetrahydrocannabinol NONE DETECTED NONE DETECTED   Barbiturates NONE DETECTED NONE DETECTED  CBG monitoring, ED  Result Value Ref Range   Glucose-Capillary 61 (L) 65 - 99 mg/dL       MDM   Iv ns. Labs.  Reviewed nursing notes and prior charts for additional history.   Additional iv ns bolus. Zofran.  Pt still unable to eat. Additional fluids, nausea meds.  Pt very shaky, hr 479. ?hx complicated etoh withdrawal.  Pt with met acidosis, felt likely secondary starvation ketosis, volume depletion, chronic etoh abuse - will admit for ivf, tx etoh withdrawal, obs.  ciwa protocol started.  hospitalists paged for admission (on review prior charts, prior admits for same).       Lajean Saver, MD 09/16/14 [phone]

## 2014-09-17 DIAGNOSIS — F1010 Alcohol abuse, uncomplicated: Secondary | ICD-10-CM

## 2014-09-17 DIAGNOSIS — F10230 Alcohol dependence with withdrawal, uncomplicated: Secondary | ICD-10-CM

## 2014-09-17 DIAGNOSIS — E440 Moderate protein-calorie malnutrition: Secondary | ICD-10-CM | POA: Insufficient documentation

## 2014-09-17 DIAGNOSIS — E162 Hypoglycemia, unspecified: Principal | ICD-10-CM

## 2014-09-17 LAB — GLUCOSE, CAPILLARY: Glucose-Capillary: 97 mg/dL (ref 65–99)

## 2014-09-17 LAB — BASIC METABOLIC PANEL
Anion gap: 5 (ref 5–15)
BUN: 9 mg/dL (ref 6–20)
CHLORIDE: 104 mmol/L (ref 101–111)
CO2: 27 mmol/L (ref 22–32)
CREATININE: 0.71 mg/dL (ref 0.61–1.24)
Calcium: 7.7 mg/dL — ABNORMAL LOW (ref 8.9–10.3)
GFR calc Af Amer: >60 mL/min (ref >60)
GFR calc non Af Amer: >60 mL/min (ref >60)
Glucose, Bld: 83 mg/dL (ref 65–99)
Potassium: 3.8 mmol/L (ref 3.5–5.1)
SODIUM: 136 mmol/L (ref 135–145)

## 2014-09-17 LAB — CBC
HEMATOCRIT: 31.7 % — AB (ref 39.0–52.0)
Hemoglobin: 10.9 g/dL — ABNORMAL LOW (ref 13.0–17.0)
MCH: 32.2 pg (ref 26.0–34.0)
MCHC: 34.4 g/dL (ref 30.0–36.0)
MCV: 93.5 fL (ref 78.0–100.0)
Platelets: 142 10*3/uL — ABNORMAL LOW (ref 150–400)
RBC: 3.39 MIL/uL — ABNORMAL LOW (ref 4.22–5.81)
RDW: 12.5 % (ref 11.5–15.5)
WBC: 9.9 10*3/uL (ref 4.0–10.5)

## 2014-09-17 NOTE — Progress Notes (Signed)
Initial Nutrition Assessment  DOCUMENTATION CODES:   Non-severe (moderate) malnutrition in context of chronic illness, Underweight  INTERVENTION:  - Continue Ensure Enlive po BID, each supplement provides 350 kcal and 20 grams of protein - RD to continue to monitor for needs  NUTRITION DIAGNOSIS:   Inadequate oral intake related to acute illness as evidenced by per patient/family report.  GOAL:   Patient will meet greater than or equal to 90% of their needs  MONITOR:   PO intake, Supplement acceptance, Weight trends, Labs  REASON FOR ASSESSMENT:   Malnutrition Screening Tool  ASSESSMENT:  34 y.o. male with h/o alcohol abuse, multiple admissions in thfor alcohol ketoacidosis and hypoglycemia, was brought in by EMS after a syncopal episode.e past for alcohol ketoacidosis and hypoglycemia, was brought in by EMS after a syncopal episode.  Pt seen for MST. BMI indicates underweight status. Moderate muscle and fat wasting noted throughout. Pt states he ate some breakfast this AM; chart indicates 70% intake of this meal. RN brought Ensure to room at time of RD visit and pt accepted strawberry flavor.  Pt states that for the past week he has been nauseated and has had a poor appetite. He was still eating meals but typically ate lighter items such as a sandwich and that he would not eat 3 full meals/day.   Pt reports UBW of 115-120 lbs and that he has always has trouble gaining weight. Per chart review, 5 lb weight loss (4% body weight) in the past 3 months which is not significant for time frame.  Likely not meeting needs. Medications reviewed. Labs reviewed; Ca: 7.7 mmol/L.  Diet Order:  Regular  Skin:  Reviewed, no issues  Last BM:  PTA  Height:   Ht Readings from Last 1 Encounters:  09/16/14 5\' 7"  (1.702 m)    Weight:   Wt Readings from Last 1 Encounters:  09/16/14 112 lb 3.4 oz (50.9 kg)    Ideal Body Weight:  67 kg (kg)  Wt Readings from Last 10 Encounters:   09/16/14 112 lb 3.4 oz (50.9 kg)  06/24/14 117 lb 9.6 oz (53.343 kg)  04/25/14 113 lb 8.6 oz (51.5 kg)  04/08/14 114 lb (51.71 kg)  02/21/14 120 lb (54.432 kg)  08/26/13 121 lb (54.885 kg)  03/03/13 112 lb 3.4 oz (50.9 kg)  06/01/12 115 lb 2 oz (52.22 kg)  05/29/12 117 lb 3.2 oz (53.162 kg)  05/30/11 120 lb (54.432 kg)    BMI:  Body mass index is 17.57 kg/(m^2).  Estimated Nutritional Needs:   Kcal:  1600-1800  Protein:  60-70 grams  Fluid:  2 L/day  EDUCATION NEEDS:   No education needs identified at this time     Jarome Matin, RD, LDN Inpatient Clinical Dietitian Pager # [phone] After hours/weekend pager # [phone]

## 2014-09-17 NOTE — Progress Notes (Addendum)
[facility] Progress Note   [place]ie [place] [name]:[id number] DOB: 1980/12/12 DOA: 09/16/2014 PCP: Tamsen Roers, MD  Brief narrative: Samuel [name] is a 34 y.o. male for the history of alcohol abuse admitted for syncope. Feeling weak in the morning and checked his sugar and found it to be 39. He took some glucose tablets and went back to bed when he woke up later in the day, he continued to feel lightheaded and is not sure if he passed out. He subsequently went to the bedroom and asked his wife called EMS.   Subjective: He has no complaints. His wife states that he is barely been eating and has mostly been drinking alcohol. The patient states that his severe depression and other personal stress due to family issues but would like to quit drinking. He thinks he'll be getting a job next week and this will help him with his depression and his drinking issues. He still requests a psych consult to be started on an antidepressant.   Assessment/Plan: Principal Problem:   Hypoglycemia - not sure of the accuracy of his home reading -Lowest sugar recorded thus far in the hospital was 27 -He is not sure if he passed out -Continue to follow sugars while in the hospital  Active Problems:   Alcohol withdrawal- alcohol abuse -Continue CIWA scale- he has received resources in the past to help discontinue alcohol use-we'll ask SW to provide these to him again  Depression -Psych consult requested by myself today    Malnutrition of moderate degree -his weight has actually not changed since February when he was last admitted - Ensure added  Appt with PCP: Requested Code Status: Full code  Family Communication: Wife at bedside Disposition Plan: Discharge to home when stable  Antibiotics: Anti-infectives    None      Objective: Filed Weights   09/16/14 1811  Weight: 50.9 kg (112 lb 3.4 oz)    Intake/Output Summary (Last 24 hours) at 09/17/14 1758 Last data filed at 09/17/14 1700  Gross  per 24 hour  Intake 1846.25 ml  Output   1175 ml  Net 671.25 ml     Vitals Filed Vitals:   09/17/14 0011 09/17/14 0534 09/17/14 1155 09/17/14 1305  BP: 108/68 106/70 114/68 117/77  Pulse: 91 81 94 73  Temp: 98.5 F (36.9 C) 98.1 F (36.7 C)  98.5 F (36.9 C)  TempSrc: Oral Oral  Oral  Resp: 18 18 18 16   Height:      Weight:      SpO2: 98% 98% 98% 99%    Exam:  General:  Pt is alert, not in acute distress  HEENT: No icterus, No thrush, oral mucosa moist  Cardiovascular: regular rate and rhythm, S1/S2 No murmur  Respiratory: clear to auscultation bilaterally   Abdomen: Soft, +Bowel sounds, non tender, non distended, no guarding  MSK: No LE edema, cyanosis or clubbing  Data Reviewed: Basic Metabolic Panel:  Recent Labs Lab 09/16/14 1322 09/17/14 0455  NA 143 136  K 4.5 3.8  CL 103 104  CO2 16* 27  GLUCOSE 61* 83  BUN 11 9  CREATININE 0.81 0.71  CALCIUM 8.6* 7.7*   Liver Function Tests:  Recent Labs Lab 09/16/14 1322  AST 141*  ALT 88*  ALKPHOS 48  BILITOT 1.3*  PROT 7.3  ALBUMIN 4.8   No results for input(s): LIPASE, AMYLASE in the last 168 hours. No results for input(s): AMMONIA in the last 168 hours. CBC:  Recent Labs Lab 09/16/14 1322 09/17/14  0455  WBC 19.3* 9.9  HGB 13.5 10.9*  HCT 40.2 31.7*  MCV 95.7 93.5  PLT 170 142*   Cardiac Enzymes: No results for input(s): CKTOTAL, CKMB, CKMBINDEX, TROPONINI in the last 168 hours. BNP (last 3 results) No results for input(s): BNP in the last 8760 hours.  ProBNP (last 3 results) No results for input(s): PROBNP in the last 8760 hours.  CBG:  Recent Labs Lab 09/16/14 1321 09/16/14 1537 09/17/14 0615  GLUCAP 61* 218* 97    No results found for this or any previous visit (from the past 240 hour(s)).   Studies: No results found.  Scheduled Meds:  Scheduled Meds: . antiseptic oral rinse  7 mL Mouth Rinse q12n4p  . chlorhexidine  15 mL Mouth Rinse BID  . enoxaparin (LOVENOX)  injection  40 mg Subcutaneous Q24H  . feeding supplement (ENSURE ENLIVE)  237 mL Oral BID BM  . LORazepam  0-4 mg Oral 4 times per day   Followed by  . [START ON 09/18/2014] LORazepam  0-4 mg Oral Q12H  . pantoprazole (PROTONIX) IV  40 mg Intravenous Q24H   Continuous Infusions:   Time spent on care of this patient: 35 min   Tavernier, MD 09/17/2014, 5:58 PM  LOS: 1 day   [facility] Office  [phone] Pager - Text Page per [email or web address] If 7PM-7AM, please contact night-coverage [email or web address]

## 2014-09-18 DIAGNOSIS — E440 Moderate protein-calorie malnutrition: Secondary | ICD-10-CM

## 2014-09-18 DIAGNOSIS — F4312 Post-traumatic stress disorder, chronic: Secondary | ICD-10-CM

## 2014-09-18 DIAGNOSIS — F418 Other specified anxiety disorders: Secondary | ICD-10-CM | POA: Insufficient documentation

## 2014-09-18 DIAGNOSIS — F341 Dysthymic disorder: Secondary | ICD-10-CM

## 2014-09-18 LAB — GLUCOSE, CAPILLARY: Glucose-Capillary: 110 mg/dL — ABNORMAL HIGH (ref 65–99)

## 2014-09-18 MED ORDER — FLUOXETINE HCL 10 MG PO CAPS: 10.0000 mg | ORAL_CAPSULE | Freq: Every day | ORAL | Status: DC

## 2014-09-18 MED ORDER — MIRTAZAPINE 7.5 MG PO TABS: 7.5000 mg | ORAL_TABLET | Freq: Every day | ORAL | Status: DC

## 2014-09-18 MED ORDER — FLUOXETINE HCL 10 MG PO CAPS
10.0000 mg | ORAL_CAPSULE | Freq: Every day | ORAL | Status: DC
  Administered 2014-09-18: 10 mg via ORAL
  Filled 2014-09-18: qty 1

## 2014-09-18 MED ORDER — PANTOPRAZOLE SODIUM 40 MG PO TBEC: 40.0000 mg | DELAYED_RELEASE_TABLET | Freq: Every day | ORAL | Status: DC

## 2014-09-18 MED ORDER — ENSURE ENLIVE PO LIQD: 237.0000 mL | Freq: Two times a day (BID) | ORAL | Status: DC

## 2014-09-18 NOTE — Discharge Summary (Signed)
Physician Discharge Summary  Samuel [name] VOH:[id number] DOB: [birth year] DOA: 09/16/2014  PCP: Tamsen Roers, MD  Admit date: 09/16/2014 Discharge date: 09/18/2014  Time spent: 50 minutes  Recommendations for Outpatient Follow-up:  Psych will refer him to [facility] recovery center in [place] per their notes  Discharge Condition: stable Diet recommendation: regular diet  Discharge Diagnoses:  Principal Problem:   ETOH abuse Active Problems:   Hypoglycemia   Alcohol withdrawal   Malnutrition of moderate degree   History of present illness:  Samuel [name] is a 34 y.o. male for the history of alcohol abuse admitted for syncope. Feeling weak in the morning and checked his sugar and found it to be 39. He took some glucose tablets and went back to bed when he woke up later in the day, he continued to feel lightheaded and is not sure if he passed out. He subsequently went to the bedroom and asked his wife to call EMS.  Hospital Course:  Principal Problem:  Hypoglycemia- syncope?  -He is not sure if he passed out - not sure of the accuracy of his home reading -Lowest sugar recorded thus far in the hospital was 61 on the evening of his admission - eating well in the hospital   Active Problems:  Alcohol withdrawal- alcohol abuse -no significant withdrawal symptoms - he has received resources in the past to help discontinue alcohol use-we'll ask SW to provide these to him again  Depression -Psych consult requested - started on Prozac and Remeron   Malnutrition of moderate degree - although, his BMI is low  Body mass index is 17.57 kg/(m^2)., his weight has actually not changed since February when he was last admitted - Ensure added   Discharge Exam: Filed Weights   09/16/14 1811  Weight: 50.9 kg (112 lb 3.4 oz)   Filed Vitals:   09/18/14 1425  BP: 106/66  Pulse: 68  Temp: 98.1 F (36.7 C)  Resp: 16    General: AAO x 3, no distress Cardiovascular: RRR, no murmurs   Respiratory: clear to auscultation bilaterally GI: soft, non-tender, non-distended, bowel sound positive  Discharge Instructions You were cared for by a hospitalist during your hospital stay. If you have any questions about your discharge medications or the care you received while you were in the hospital after you are discharged, you can call the unit and asked to speak with the hospitalist on call if the hospitalist that took care of you is not available. Once you are discharged, your primary care physician will handle any further medical issues. Please note that NO REFILLS for any discharge medications will be authorized once you are discharged, as it is imperative that you return to your primary care physician (or establish a relationship with a primary care physician if you do not have one) for your aftercare needs so that they can reassess your need for medications and monitor your lab values.      Discharge Instructions    Diet - low sodium heart healthy    Complete by:  As directed      Discharge instructions    Complete by:  As directed   Prozac can take up to 8 wks to work. You physician can later increase the dose if needed. Do not stop it suddenly and do not change the dose on your own.     Increase activity slowly    Complete by:  As directed             Medication List  STOP taking these medications        ibuprofen 200 MG tablet  Commonly known as:  ADVIL,MOTRIN      TAKE these medications        feeding supplement (ENSURE ENLIVE) Liqd  Take 237 mLs by mouth 2 (two) times daily between meals.     FLUoxetine 10 MG capsule  Commonly known as:  PROZAC  Take 1 capsule (10 mg total) by mouth daily.     mirtazapine 7.5 MG tablet  Commonly known as:  REMERON  Take 1 tablet (7.5 mg total) by mouth at bedtime.     ranitidine 150 MG tablet  Commonly known as:  ZANTAC  Take 150 mg by mouth daily as needed for heartburn.       No Known Allergies Follow-up  Information    Follow up with Tamsen Roers, MD. Go on 09/25/2014.   Specialty:  Family Medicine   Why:  at 1015 am for hospital follow up   Contact information:   [address] [place] [address] [phone]        The results of significant diagnostics from this hospitalization (including imaging, microbiology, ancillary and laboratory) are listed below for reference.    Significant Diagnostic Studies: No results found.  Microbiology: No results found for this or any previous visit (from the past 240 hour(s)).   Labs: Basic Metabolic Panel:  Recent Labs Lab 09/16/14 1322 09/17/14 0455  NA 143 136  K 4.5 3.8  CL 103 104  CO2 16* 27  GLUCOSE 61* 83  BUN 11 9  CREATININE 0.81 0.71  CALCIUM 8.6* 7.7*   Liver Function Tests:  Recent Labs Lab 09/16/14 1322  AST 141*  ALT 88*  ALKPHOS 48  BILITOT 1.3*  PROT 7.3  ALBUMIN 4.8   No results for input(s): LIPASE, AMYLASE in the last 168 hours. No results for input(s): AMMONIA in the last 168 hours. CBC:  Recent Labs Lab 09/16/14 1322 09/17/14 0455  WBC 19.3* 9.9  HGB 13.5 10.9*  HCT 40.2 31.7*  MCV 95.7 93.5  PLT 170 142*   Cardiac Enzymes: No results for input(s): CKTOTAL, CKMB, CKMBINDEX, TROPONINI in the last 168 hours. BNP: BNP (last 3 results) No results for input(s): BNP in the last 8760 hours.  ProBNP (last 3 results) No results for input(s): PROBNP in the last 8760 hours.  CBG:  Recent Labs Lab 09/16/14 1321 09/16/14 1537 09/17/14 0615 09/18/14 0754  GLUCAP 61* 218* 97 110*       SignedDebbe Odea, MD [facility] 09/18/2014, 4:40 PM

## 2014-09-18 NOTE — Discharge Instructions (Signed)
Fluoxetine capsules or tablets (Depression/Mood Disorders) What is this medicine? FLUOXETINE (floo OX e teen) belongs to a class of drugs known as selective serotonin reuptake inhibitors (SSRIs). It helps to treat mood problems such as depression, obsessive compulsive disorder, and panic attacks. It can also treat certain eating disorders. This medicine may be used for other purposes; ask your health care provider or pharmacist if you have questions. COMMON BRAND NAME(S): Prozac What should I tell my health care provider before I take this medicine? They need to know if you have any of these conditions: -bipolar disorder or mania -diabetes -glaucoma -liver disease -psychosis -seizures -suicidal thoughts or history of attempted suicide -an unusual or allergic reaction to fluoxetine, other medicines, foods, dyes, or preservatives -pregnant or trying to get pregnant -breast-feeding How should I use this medicine? Take this medicine by mouth with a glass of water. Follow the directions on the prescription label. You can take this medicine with or without food. Take your medicine at regular intervals. Do not take it more often than directed. Do not stop taking this medicine suddenly except upon the advice of your doctor. Stopping this medicine too quickly may cause serious side effects or your condition may worsen. A special MedGuide will be given to you by the pharmacist with each prescription and refill. Be sure to read this information carefully each time. Talk to your pediatrician regarding the use of this medicine in children. While this drug may be prescribed for children as young as 7 years for selected conditions, precautions do apply. Overdosage: If you think you have taken too much of this medicine contact a poison control center or emergency room at once. NOTE: This medicine is only for you. Do not share this medicine with others. What if I miss a dose? If you miss a dose, skip the  missed dose and go back to your regular dosing schedule. Do not take double or extra doses. What may interact with this medicine? Do not take fluoxetine with any of the following medications: -other medicines containing fluoxetine, like Sarafem or Symbyax -cisapride -linezolid -MAOIs like Carbex, Eldepryl, Marplan, Nardil, and Parnate -methylene blue (injected into a vein) -pimozide -thioridazine This medicine may also interact with the following medications: -alcohol -aspirin and aspirin-like medicines -carbamazepine -certain medicines for depression, anxiety, or psychotic disturbances -certain medicines for migraine headaches like almotriptan, eletriptan, frovatriptan, naratriptan, rizatriptan, sumatriptan, zolmitriptan -digoxin -diuretics -fentanyl -flecainide -furazolidone -isoniazid -lithium -medicines for sleep -medicines that treat or prevent blood clots like warfarin, enoxaparin, and dalteparin -NSAIDs, medicines for pain and inflammation, like ibuprofen or naproxen -phenytoin -procarbazine -propafenone -rasagiline -ritonavir -supplements like St. John's wort, kava kava, valerian -tramadol -tryptophan -vinblastine This list may not describe all possible interactions. Give your health care provider a list of all the medicines, herbs, non-prescription drugs, or dietary supplements you use. Also tell them if you smoke, drink alcohol, or use illegal drugs. Some items may interact with your medicine. What should I watch for while using this medicine? Tell your doctor if your symptoms do not get better or if they get worse. Visit your doctor or health care professional for regular checks on your progress. Because it may take several weeks to see the full effects of this medicine, it is important to continue your treatment as prescribed by your doctor. Patients and their families should watch out for new or worsening thoughts of suicide or depression. Also watch out for sudden  changes in feelings such as feeling anxious, agitated, panicky, irritable, hostile,  aggressive, impulsive, severely restless, overly excited and hyperactive, or not being able to sleep. If this happens, especially at the beginning of treatment or after a change in dose, call your health care professional. Dennis Bast may get drowsy or dizzy. Do not drive, use machinery, or do anything that needs mental alertness until you know how this medicine affects you. Do not stand or sit up quickly, especially if you are an older patient. This reduces the risk of dizzy or fainting spells. Alcohol may interfere with the effect of this medicine. Avoid alcoholic drinks. Your mouth may get dry. Chewing sugarless gum or sucking hard candy, and drinking plenty of water may help. Contact your doctor if the problem does not go away or is severe. This medicine may affect blood sugar levels. If you have diabetes, check with your doctor or health care professional before you change your diet or the dose of your diabetic medicine. What side effects may I notice from receiving this medicine? Side effects that you should report to your doctor or health care professional as soon as possible: -allergic reactions like skin rash, itching or hives, swelling of the face, lips, or tongue -breathing problems -confusion -eye pain, changes in vision -fast or irregular heart rate, palpitations -flu-like fever, chills, cough, muscle or joint aches and pains -seizures -suicidal thoughts or other mood changes -tremors -trouble sleeping -unusual bleeding or bruising -unusually tired or weak -vomiting Side effects that usually do not require medical attention (report to your doctor or health care professional if they continue or are bothersome): -change in sex drive or performance -diarrhea -dry mouth -flushing -headache -increased or decreased appetite -nausea -sweating This list may not describe all possible side effects. Call your  doctor for medical advice about side effects. You may report side effects to FDA at 1-800-FDA-1088. Where should I keep my medicine? Keep out of the reach of children. Store at room temperature between 15 and 30 degrees C (59 and 86 degrees F). Throw away any unused medicine after the expiration date. NOTE: This sheet is a summary. It may not cover all possible information. If you have questions about this medicine, talk to your doctor, pharmacist, or health care provider.  2015, [employer]. (2013-04-17 18:27:34)

## 2014-09-18 NOTE — Progress Notes (Signed)
Key Points: Use following P&T approved IV to PO antibiotic change policy.  Description contains the criteria that are approved Note: Policy Excludes:  Esophagectomy patients  PHARMACIST - PHYSICIAN COMMUNICATION DR:   Wynelle Cleveland  CONCERNING: IV to Oral Route Change Policy  RECOMMENDATION: This patient is receiving protonix by the intravenous route.  Based on criteria approved by the Pharmacy and Therapeutics Committee, the intravenous medication(s) is/are being converted to the equivalent oral dose form(s).   DESCRIPTION: These criteria include:  The patient is eating (either orally or via tube) and/or has been taking other orally administered medications for a least 24 hours  The patient has no evidence of active gastrointestinal bleeding or impaired GI absorption (gastrectomy, short bowel, patient on TNA or NPO).  If you have questions about this conversion, please contact the Pharmacy Department  []   [phone] )  [facility] []   [phone] )  [facility] []   [phone] )  [facility] []   [phone] )  [facility] [x]   [phone] )  [place], [facility] 09/18/2014 7:34 AM
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
## 2015-04-29 ENCOUNTER — Emergency Department (HOSPITAL_COMMUNITY)
Admission: EM | Admit: 2015-04-29 | Discharge: 2015-04-30 | Disposition: A | Discharge status: Home / Self Care | Payer: Medicaid Other | Attending: Emergency Medicine | Admitting: Emergency Medicine

## 2015-04-29 ENCOUNTER — Emergency Department (HOSPITAL_COMMUNITY): Payer: Medicaid Other

## 2015-04-29 ENCOUNTER — Encounter (HOSPITAL_COMMUNITY): Payer: Self-pay | Admitting: Emergency Medicine

## 2015-04-29 DIAGNOSIS — F4310 Post-traumatic stress disorder, unspecified: Secondary | ICD-10-CM | POA: Diagnosis not present

## 2015-04-29 DIAGNOSIS — Z8639 Personal history of other endocrine, nutritional and metabolic disease: Secondary | ICD-10-CM | POA: Insufficient documentation

## 2015-04-29 DIAGNOSIS — M79641 Pain in right hand: Secondary | ICD-10-CM | POA: Diagnosis present

## 2015-04-29 DIAGNOSIS — K219 Gastro-esophageal reflux disease without esophagitis: Secondary | ICD-10-CM | POA: Insufficient documentation

## 2015-04-29 NOTE — ED Notes (Signed)
Pt states he is having pain in his right hand  Pt states he injured it a while back and stretched the ligaments but today the pain is worse and now he states it is starting to swell  Pt does have a swollen area noted to his posterior hand

## 2015-04-29 NOTE — ED Notes (Signed)
PA at bedside.

## 2015-04-29 NOTE — ED Provider Notes (Signed)
CSN: [id number]     Arrival date & time 04/29/15  2150 History   First MD Initiated Contact with Patient 04/29/15 2333     Chief Complaint  Patient presents with  . Hand Pain    HPI   Samuel [name] is a 35 y.o. male with a PMH of GERD, diverticulitis, alcohol abuse who presents to the ED with right hand pain. He states he injured a ligament in his hand in November and was seen by hand surgery for this. He notes he had cortisone injections in December and January, and was released to return to work after experiencing symptom improvement. He states today he lifted something heavy with his right fingers, and felt a stretching sensation in his right hand and noticed a small area of swelling. He denies fever, chills, numbness, weakness, paresthesia. He states movement exacerbates his pain. He has not tried anything for symptom relief.   Past Medical History  Diagnosis Date  . GERD (gastroesophageal reflux disease)   . H/O hiatal hernia     pt states he had an EGD at age 13 and had a hiatal hernia  . Dysphagia   . Diverticulitis of colon with bleeding 2010    Hospitalized in 2010 for "intestinal infection requiring IV antibiotics"  . PTSD (post-traumatic stress disorder)   . Alcohol abuse   . Hypoglycemia     recurrent  . Elevated LFTs    Past Surgical History  Procedure Laterality Date  . Tonsillectomy    . Esophagogastroduodenoscopy      age 66  . Colonoscopy  05/15/2011    Procedure: COLONOSCOPY;  Surgeon: Irene Shipper, MD;  Location: [place];  Service: Endoscopy;  Laterality: N/A;  . Esophagogastroduodenoscopy  05/15/2011    Procedure: ESOPHAGOGASTRODUODENOSCOPY (EGD);  Surgeon: Irene Shipper, MD;  [facility] ENDOSCOPY;  Service: Endoscopy;  Laterality: N/A;  . Givens capsule study  05/30/2011    Procedure: GIVENS CAPSULE STUDY;  Surgeon: Irene Shipper, MD;  Location: [place];  Service: Endoscopy;  Laterality: N/A;  Givens Capsule study.   Family History  Problem Relation  Age of Onset  . Diabetes type II Mother   . Emphysema Father   . Heart attack Father   . Stroke Father   . Diabetes type II Other   . Diabetes type II Maternal Aunt    Social History  Substance Use Topics  . Smoking status: Never Smoker   . Smokeless tobacco: Current User    Types: Chew     Comment: States he has been chewing tobacco almost daily since age 35  . Alcohol Use: 1.8 oz/week    3 Cans of beer per week     Comment: average six 12oz beers/day, ranges from 3-12 12oz/day      Review of Systems  Constitutional: Negative for fever and chills.  Musculoskeletal: Positive for myalgias.  Skin: Negative for color change.  Neurological: Negative for weakness and numbness.      Allergies  Review of patient's allergies indicates no known allergies.  Home Medications   Prior to Admission medications   Medication Sig Start Date End Date Taking? Authorizing Provider  ibuprofen (ADVIL,MOTRIN) 200 MG tablet Take 400 mg by mouth every 6 (six) hours as needed for moderate pain.   Yes Historical Provider, MD  ranitidine (ZANTAC) 150 MG tablet Take 150 mg by mouth daily as needed for heartburn.   Yes Historical Provider, MD  feeding supplement, ENSURE ENLIVE, (ENSURE ENLIVE) LIQD Take 237 mLs  by mouth 2 (two) times daily between meals. Patient not taking: Reported on 04/29/2015 09/18/14   Debbe Odea, MD  FLUoxetine (PROZAC) 10 MG capsule Take 1 capsule (10 mg total) by mouth daily. Patient not taking: Reported on 04/29/2015 09/18/14   Debbe Odea, MD  mirtazapine (REMERON) 7.5 MG tablet Take 1 tablet (7.5 mg total) by mouth at bedtime. Patient not taking: Reported on 04/29/2015 09/18/14   Debbe Odea, MD    BP 138/108 mmHg  Pulse 77  Temp(Src) 98.1 F (36.7 C) (Oral)  Resp 18  SpO2 100% Physical Exam  Constitutional: He is oriented to person, place, and time. He appears well-developed and well-nourished. No distress.  HENT:  Head: Normocephalic and atraumatic.  Right Ear:  External ear normal.  Left Ear: External ear normal.  Nose: Nose normal.  Eyes: Conjunctivae and EOM are normal. Right eye exhibits no discharge. Left eye exhibits no discharge. No scleral icterus.  Neck: Normal range of motion. Neck supple.  Cardiovascular: Normal rate, regular rhythm and intact distal pulses.   Pulmonary/Chest: Effort normal and breath sounds normal. No respiratory distress.  Musculoskeletal: Normal range of motion. He exhibits tenderness. He exhibits no edema.  Mild TTP to dorsal aspect of right hand over 1st metacarpal with small area of edema. No erythema or heat.   Neurological: He is alert and oriented to person, place, and time. He has normal strength. No sensory deficit.  Skin: Skin is warm and dry. He is not diaphoretic.  Psychiatric: He has a normal mood and affect. His behavior is normal.  Nursing note and vitals reviewed.   ED Course  Procedures (including critical care time)  Labs Review Labs Reviewed - No data to display  Imaging Review Dg Hand Complete Right  04/29/2015  CLINICAL DATA:  Pain injury several weeks ago now with pain and swelling. EXAM: RIGHT HAND - COMPLETE 3+ VIEW COMPARISON:  01/21/2015 FINDINGS: There is no evidence of fracture or dislocation. There is no evidence of arthropathy or other focal bone abnormality. Soft tissues are unremarkable. IMPRESSION: Negative. Electronically Signed   By: Misty Stanley M.D.   On: 04/29/2015 23:47   I have personally reviewed and evaluated these images and lab results as part of my medical decision-making.   EKG Interpretation None      MDM   Final diagnoses:  Right hand pain    35 year old male presents with right hand pain. States he lifted something heavy with his right hand at work and felt a stretching sensation. Patient is afebrile. On exam, he has mild TTP to the dorsal aspect of his right hand over his 1st metacarpal with small area of edema. No erythema or heat. Full ROM. Strength and  sensation intact. Distal pulses intact. Imaging negative for fracture, dislocation, focal bone abnormality. Patient is non-toxic and well-appearing, feel he is stable for discharge at this time. Advised to rest, ice, and elevate and to wear brace for comfort (states he has at home). Patient to follow-up with hand surgery for persistent symptoms. Return precautions discussed. Patient verbalizes his understanding and is in agreement with plan.  BP 138/108 mmHg  Pulse 77  Temp(Src) 98.1 F (36.7 C) (Oral)  Resp 18  SpO2 100%     Marella Chimes, PA-C 04/30/15 0103  Virgel Manifold, MD 04/30/15 2253

## 2015-04-30 NOTE — ED Notes (Signed)
Patient was alert, oriented and stable upon discharge. RN went over AVS and patient had no further questions.  

## 2015-04-30 NOTE — Discharge Instructions (Signed)
1. Medications: tylenol or motrin for pain, usual home medications 2. Treatment: rest, drink plenty of fluids, ice, elevate, wear brace for comfort 3. Follow Up: please followup with your primary doctor and with your hand specialist for persistent symptoms for discussion of your diagnoses and further evaluation after today's visit; if you do not have a primary care doctor use the resource guide provided to find one; please return to the ER for increased pain or swelling, numbness, weakness, new or worsening symptoms   Musculoskeletal Pain Musculoskeletal pain is muscle and boney aches and pains. These pains can occur in any part of the body. Your caregiver may treat you without knowing the cause of the pain. They may treat you if blood or urine tests, X-rays, and other tests were normal.  CAUSES There is often not a definite cause or reason for these pains. These pains may be caused by a type of germ (virus). The discomfort may also come from overuse. Overuse includes working out too hard when your body is not fit. Boney aches also come from weather changes. Bone is sensitive to atmospheric pressure changes. HOME CARE INSTRUCTIONS   Ask when your test results will be ready. Make sure you get your test results.  Only take over-the-counter or prescription medicines for pain, discomfort, or fever as directed by your caregiver. If you were given medications for your condition, do not drive, operate machinery or power tools, or sign legal documents for 24 hours. Do not drink alcohol. Do not take sleeping pills or other medications that may interfere with treatment.  Continue all activities unless the activities cause more pain. When the pain lessens, slowly resume normal activities. Gradually increase the intensity and duration of the activities or exercise.  During periods of severe pain, bed rest may be helpful. Lay or sit in any position that is comfortable.  Putting ice on the injured area.  Put  ice in a bag.  Place a towel between your skin and the bag.  Leave the ice on for 15 to 20 minutes, 3 to 4 times a day.  Follow up with your caregiver for continued problems and no reason can be found for the pain. If the pain becomes worse or does not go away, it may be necessary to repeat tests or do additional testing. Your caregiver may need to look further for a possible cause. SEEK IMMEDIATE MEDICAL CARE IF:  You have pain that is getting worse and is not relieved by medications.  You develop chest pain that is associated with shortness or breath, sweating, feeling sick to your stomach (nauseous), or throw up (vomit).  Your pain becomes localized to the abdomen.  You develop any new symptoms that seem different or that concern you. MAKE SURE YOU:   Understand these instructions.  Will watch your condition.  Will get help right away if you are not doing well or get worse.   This information is not intended to replace advice given to you by your health care provider. Make sure you discuss any questions you have with your health care provider.   Document Released: 02/21/2005 Document Revised: 05/16/2011 Document Reviewed: 10/26/2012 Elsevier Interactive Patient Education [employer].

## 2015-08-26 IMAGING — CR DG ABDOMEN ACUTE W/ 1V CHEST
3 series · 3 of 3 positions shown · non-contrast
Comparison: Chest radiograph on 04/08/2014

CLINICAL DATA: Lower abdominal pain.

EXAM:
ACUTE ABDOMEN SERIES (ABDOMEN 2 VIEW & CHEST 1 VIEW)

[w chest pa]
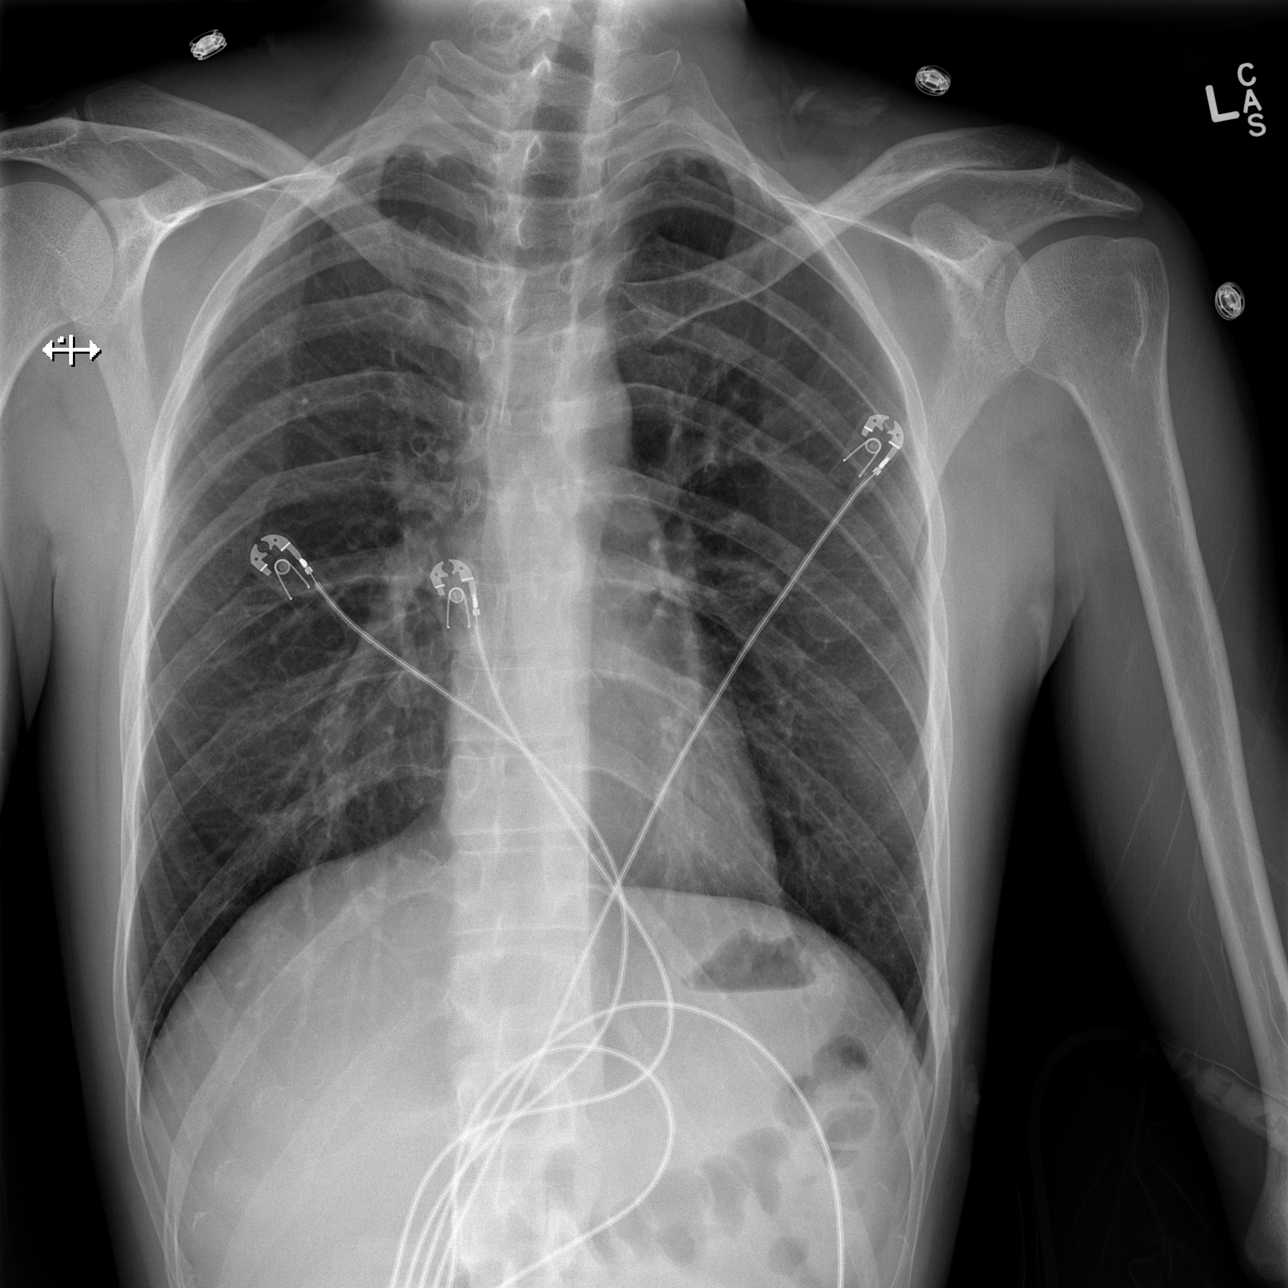

[w abdomen upright]
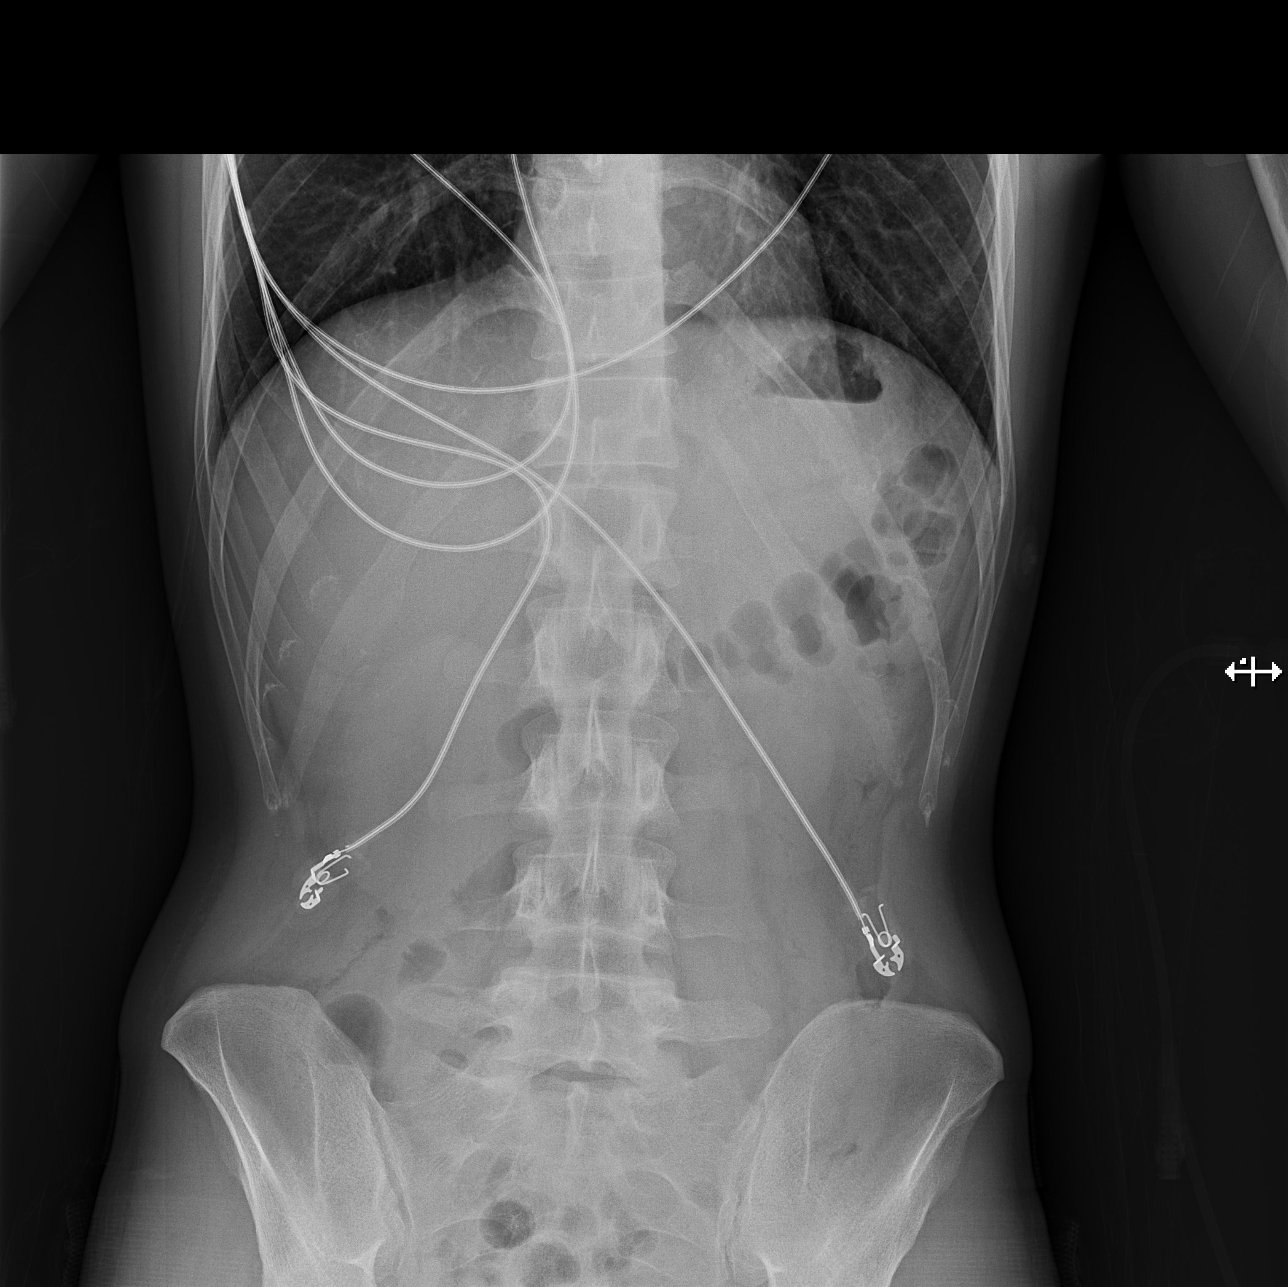

[t abdomen supine]
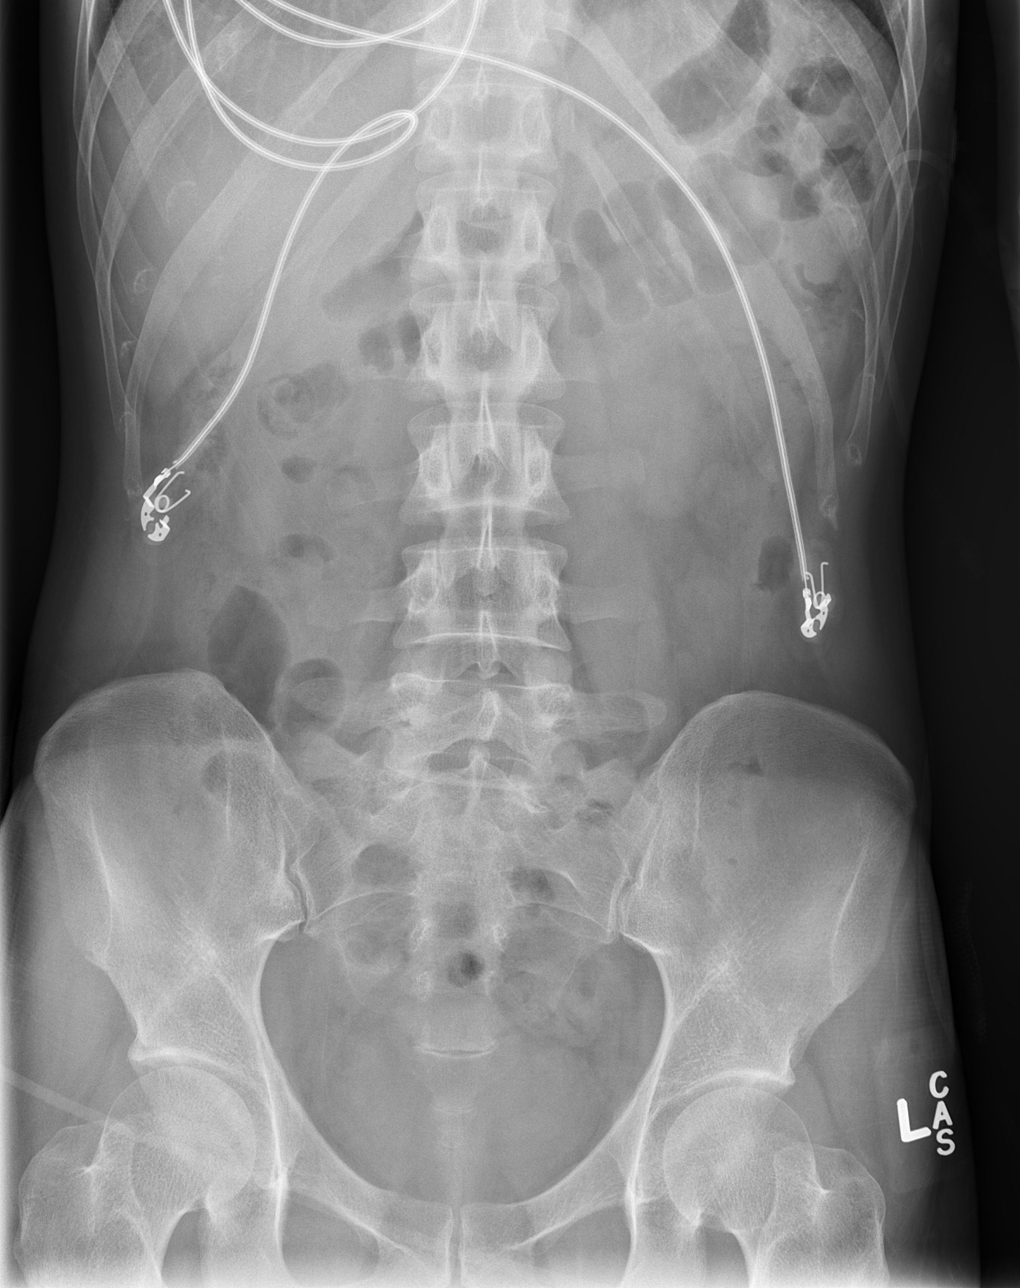

[3 of 3 positions shown; findings below may reference images not displayed]

FINDINGS: There is no evidence of dilated bowel loops or free intraperitoneal
air. No radiopaque calculi or other significant radiographic
abnormality is seen. Heart size and mediastinal contours are within
normal limits. Both lungs are clear.
IMPRESSION: Negative abdominal radiographs.  No acute cardiopulmonary disease.

## 2015-10-03 ENCOUNTER — Emergency Department (HOSPITAL_COMMUNITY)
Admission: EM | Admit: 2015-10-03 | Discharge: 2015-10-03 | Disposition: A | Discharge status: Home / Self Care | Payer: Medicaid Other | Attending: Emergency Medicine | Admitting: Emergency Medicine

## 2015-10-03 ENCOUNTER — Encounter (HOSPITAL_COMMUNITY): Payer: Self-pay

## 2015-10-03 DIAGNOSIS — F17290 Nicotine dependence, other tobacco product, uncomplicated: Secondary | ICD-10-CM | POA: Insufficient documentation

## 2015-10-03 DIAGNOSIS — R945 Abnormal results of liver function studies: Secondary | ICD-10-CM | POA: Insufficient documentation

## 2015-10-03 DIAGNOSIS — Z79899 Other long term (current) drug therapy: Secondary | ICD-10-CM | POA: Insufficient documentation

## 2015-10-03 DIAGNOSIS — Z791 Long term (current) use of non-steroidal anti-inflammatories (NSAID): Secondary | ICD-10-CM | POA: Insufficient documentation

## 2015-10-03 DIAGNOSIS — E860 Dehydration: Secondary | ICD-10-CM | POA: Diagnosis not present

## 2015-10-03 DIAGNOSIS — R531 Weakness: Secondary | ICD-10-CM | POA: Diagnosis present

## 2015-10-03 DIAGNOSIS — R748 Abnormal levels of other serum enzymes: Secondary | ICD-10-CM

## 2015-10-03 LAB — COMPREHENSIVE METABOLIC PANEL
ALBUMIN: 4.9 g/dL (ref 3.5–5.0)
ALT: 87 U/L — ABNORMAL HIGH (ref 17–63)
ANION GAP: 13 (ref 5–15)
AST: 130 U/L — AB (ref 15–41)
Alkaline Phosphatase: 52 U/L (ref 38–126)
BUN: 12 mg/dL (ref 6–20)
CHLORIDE: 100 mmol/L — AB (ref 101–111)
CO2: 27 mmol/L (ref 22–32)
Calcium: 9.8 mg/dL (ref 8.9–10.3)
Creatinine, Ser: 0.69 mg/dL (ref 0.61–1.24)
GFR calc Af Amer: >60 mL/min (ref >60)
GFR calc non Af Amer: >60 mL/min (ref >60)
GLUCOSE: 81 mg/dL (ref 65–99)
POTASSIUM: 5 mmol/L (ref 3.5–5.1)
SODIUM: 140 mmol/L (ref 135–145)
Total Bilirubin: 2 mg/dL — ABNORMAL HIGH (ref 0.3–1.2)
Total Protein: 7.6 g/dL (ref 6.5–8.1)

## 2015-10-03 LAB — CBC WITH DIFFERENTIAL/PLATELET
BASOS ABS: 0 10*3/uL (ref 0.0–0.1)
Basophils Relative: 0 %
EOS ABS: 0 10*3/uL (ref 0.0–0.7)
Eosinophils Relative: 0 %
HCT: 44.5 % (ref 39.0–52.0)
Hemoglobin: 15 g/dL (ref 13.0–17.0)
Lymphocytes Relative: 10 %
Lymphs Abs: 0.8 10*3/uL (ref 0.7–4.0)
MCH: 31.6 pg (ref 26.0–34.0)
MCHC: 33.7 g/dL (ref 30.0–36.0)
MCV: 93.9 fL (ref 78.0–100.0)
MONO ABS: 0.6 10*3/uL (ref 0.1–1.0)
MONOS PCT: 7 %
NEUTROS ABS: 7.1 10*3/uL (ref 1.7–7.7)
Neutrophils Relative %: 83 %
PLATELETS: 166 10*3/uL (ref 150–400)
RBC: 4.74 MIL/uL (ref 4.22–5.81)
RDW: 13.8 % (ref 11.5–15.5)
WBC: 8.5 10*3/uL (ref 4.0–10.5)

## 2015-10-03 LAB — URINE MICROSCOPIC-ADD ON: Bacteria, UA: NONE SEEN

## 2015-10-03 LAB — URINALYSIS, ROUTINE W REFLEX MICROSCOPIC
Bilirubin Urine: NEGATIVE
Glucose, UA: NEGATIVE mg/dL
LEUKOCYTES UA: NEGATIVE
NITRITE: NEGATIVE
PH: 6 (ref 5.0–8.0)
PROTEIN: NEGATIVE mg/dL
Specific Gravity, Urine: 1.022 (ref 1.005–1.030)

## 2015-10-03 LAB — ETHANOL: Alcohol, Ethyl (B): <5 mg/dL (ref <5)

## 2015-10-03 LAB — MAGNESIUM: MAGNESIUM: 2 mg/dL (ref 1.7–2.4)

## 2015-10-03 LAB — LIPASE, BLOOD: Lipase: 40 U/L (ref 11–51)

## 2015-10-03 MED ORDER — SODIUM CHLORIDE 0.9 % IV BOLUS (SEPSIS)
1000.0000 mL | INTRAVENOUS | Status: AC
  Administered 2015-10-03: 1000 mL via INTRAVENOUS

## 2015-10-03 NOTE — ED Provider Notes (Signed)
[facility] DEPT Provider Note   CSN: [id number] Arrival date & time: 10/03/15  1214  First Provider Contact:  First MD Initiated Contact with Patient 10/03/15 1345        History   Chief Complaint Chief Complaint  Patient presents with  . Weakness    HPI Samuel [name] is a 35 y.o. male with a past medical history of chronic alcohol abuse, recurrent admissions for metabolic acidosis and alcoholic ketoacidosis with nausea and vomiting and presents today with chief complaint of weakness. 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 He states that one week ago he was working in the heat and sweated so much that he just quit sweating. He got chills afterward. He Has been extremely exhausted and felt very rundown since that time. Today, he began feeling extremely lightheaded like he doesn't pass out every time he stands up. He denies nausea, vomiting, abdominal pain. He continues to drink heavily per his mother, but is vague about the actual quantity. He states his last alcohol intake was last night. He denies melena, hematochezia, diarrhea, palpitations. HPI  Past Medical History:  Diagnosis Date  . Alcohol abuse   . Diverticulitis of colon with bleeding 2010   Hospitalized in 2010 for "intestinal infection requiring IV antibiotics"  . Dysphagia   . Elevated LFTs   . GERD (gastroesophageal reflux disease)   . H/O hiatal hernia    pt states he had an EGD at age 57 and had a hiatal hernia  . Hypoglycemia    recurrent  . PTSD (post-traumatic stress disorder)     Patient Active Problem List   Diagnosis Date Noted  . Depression with anxiety   . Malnutrition of moderate degree ([place]) 09/17/2014  . Alcohol withdrawal ([place]) 09/16/2014  . ETOH abuse   . Depression   . PTSD (post-traumatic stress disorder)   . Intractable nausea and vomiting 04/08/2014  . Transaminitis 08/26/2013  . Hypoglycemia 08/26/2013  . Substance induced mood disorder ([place]) 05/22/2013  .  Leukocytosis 05/28/2012  . 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 GERD (gastroesophageal reflux disease) 05/13/2011  . Alcohol abuse 05/13/2011  . Fatty liver 05/13/2011    Past Surgical History:  Procedure Laterality Date  . COLONOSCOPY  05/15/2011   Procedure: COLONOSCOPY;  Surgeon: Irene Shipper, MD;  Location[facility] ENDOSCOPY;  Service: Endoscopy;  Laterality: N/A;  . ESOPHAGOGASTRODUODENOSCOPY     age 71  . ESOPHAGOGASTRODUODENOSCOPY  05/15/2011   Procedure: ESOPHAGOGASTRODUODENOSCOPY (EGD);  Surgeon: Irene Shipper, MD;  Location: [facility] ENDOSCOPY;  Service: Endoscopy;  Laterality: N/A;  . GIVENS CAPSULE STUDY  05/30/2011   Procedure: GIVENS CAPSULE STUDY;  Surgeon: Irene Shipper, MD;  Location: [place] [facility];  Service: Endoscopy;  Laterality: N/A;  Givens Capsule study.  . TONSILLECTOMY         Home Medications    Prior to Admission medications   Medication Sig Start Date End Date Taking? Authorizing Provider  bismuth subsalicylate (PEPTO BISMOL) 262 MG chewable tablet Chew 524 mg by mouth 3 (three) times daily as needed for indigestion.   Yes Historical Provider, MD  ibuprofen (ADVIL,MOTRIN) 200 MG tablet Take 400 mg by mouth every 6 (six) hours as needed for moderate pain.   Yes Historical Provider, MD  ranitidine (ZANTAC) 150 MG tablet Take 150 mg by mouth daily as needed for heartburn.   Yes Historical Provider, MD  feeding supplement, ENSURE ENLIVE, (ENSURE ENLIVE) LIQD Take 237 mLs by mouth 2 (two) times daily  between meals. Patient not taking: Reported on 04/29/2015 09/18/14   Debbe Odea, MD  FLUoxetine (PROZAC) 10 MG capsule Take 1 capsule (10 mg total) by mouth daily. Patient not taking: Reported on 04/29/2015 09/18/14   Debbe Odea, MD  mirtazapine (REMERON) 7.5 MG tablet Take 1 tablet (7.5 mg total) by mouth at bedtime. Patient not taking: Reported on 04/29/2015 09/18/14   Debbe Odea, MD    Family History Family History  Problem Relation Age of Onset  . Diabetes type II  Mother   . Emphysema Father   . Heart attack Father   . Stroke Father   . Diabetes type II Other   . Diabetes type II Maternal Aunt     Social History Social History  Substance Use Topics  . Smoking status: Never Smoker  . Smokeless tobacco: Current User    Types: Chew     Comment: States he has been chewing tobacco almost daily since age 29  . Alcohol use 1.8 oz/week    3 Cans of beer per week     Comment: average six 12oz beers/day, ranges from 3-12 12oz/day     Allergies   Review of patient's allergies indicates no known allergies.   Review of Systems Review of Systems Ten systems reviewed and are negative for acute change, except as noted in the HPI.   Physical Exam Updated Vital Signs BP 136/89 (BP Location: Right Arm)   Pulse 80   Temp 98.4 F (36.9 C) (Oral)   Resp 18   SpO2 97%   Physical Exam  Constitutional: He is oriented to person, place, and time. He appears well-developed and well-nourished. No distress.  Very thin male.  HENT:  Head: Normocephalic and atraumatic.  Eyes: Conjunctivae and EOM are normal. Pupils are equal, round, and reactive to light. No scleral icterus.  Neck: Normal range of motion. Neck supple.  Cardiovascular: Normal rate, regular rhythm, normal heart sounds and intact distal pulses.   Pulmonary/Chest: Effort normal and breath sounds normal. No respiratory distress.  Abdominal: Soft. Bowel sounds are normal. He exhibits no distension. There is no tenderness.  Musculoskeletal: He exhibits no edema.  Neurological: He is alert and oriented to person, place, and time.  tremulous  Skin: Skin is warm and dry. No rash noted. He is not diaphoretic.  Psychiatric: His behavior is normal.  Nursing note and vitals reviewed.    ED Treatments / Results  Labs (all labs ordered are listed, but only abnormal results are displayed) Labs Reviewed  COMPREHENSIVE METABOLIC PANEL - Abnormal; Notable for the following:       Result Value    Chloride 100 (*)    AST 130 (*)    ALT 87 (*)    Total Bilirubin 2.0 (*)    All other components within normal limits  URINALYSIS, ROUTINE W REFLEX MICROSCOPIC (NOT AT [facility]) - Abnormal; Notable for the following:    Hgb urine dipstick SMALL (*)    Ketones, ur >80 (*)    All other components within normal limits  URINE MICROSCOPIC-ADD ON - Abnormal; Notable for the following:    Squamous Epithelial / LPF 0-5 (*)    All other components within normal limits  CBC WITH DIFFERENTIAL/PLATELET  ETHANOL  LIPASE, BLOOD  MAGNESIUM    EKG  EKG Interpretation  Date/Time:  Saturday October 03 2015 14:16:27 EDT Ventricular Rate:  79 PR Interval:    QRS Duration: 70 QT Interval:  378 QTC Calculation: 434 R Axis:   56  Text Interpretation:  Sinus rhythm Borderline short PR interval Abnormal R-wave progression, early transition ST elevation c/w early repolarization Confirmed by Hazle Coca [phone]) on 10/03/2015 2:28:16 PM       Radiology No results found.  Procedures Procedures (including critical care time)  Medications Ordered in ED Medications  sodium chloride 0.9 % bolus 1,000 mL (0 mLs Intravenous Stopped 10/03/15 1800)     Initial Impression / Assessment and Plan / ED Course  I have reviewed the triage vital signs and the nursing notes.  Pertinent labs & imaging results that were available during my care of the patient were reviewed by me and considered in my medical decision making (see chart for details).  Clinical Course    Patient work up appears negative at this time. Suspect heat injury and dehydration. Elevated liver enzymes are chronic. Discussed rehydration and need to d/c etoh. Patient appears safe for discharge at this time.   Final Clinical Impressions(s) / ED Diagnoses   Final diagnoses:  Dehydration  Elevated liver enzymes    New Prescriptions Discharge Medication List as of 10/03/2015  4:56 PM       Margarita Mail, PA-C 10/03/15 2247    Quintella Reichert, MD 10/04/15 1357

## 2015-10-03 NOTE — ED Notes (Signed)
Patient reports he has history of PTSD which is not being treated.  He has had panic attacks in the past.  Denies chest pain, SOB.  Says he might be dehydrated.  Not on any meds at this time.

## 2015-10-03 NOTE — ED Triage Notes (Signed)
He states he has felt badly and "kinda weak [sic] ever since I got real hot at work last Friday".  He is in no distress.

## 2015-11-30 ENCOUNTER — Emergency Department (HOSPITAL_COMMUNITY)
Admission: EM | Admit: 2015-11-30 | Discharge: 2015-11-30 | Disposition: A | Discharge status: Home / Self Care | Payer: Medicaid Other | Attending: Dermatology | Admitting: Dermatology

## 2015-11-30 ENCOUNTER — Encounter (HOSPITAL_COMMUNITY): Payer: Self-pay

## 2015-11-30 DIAGNOSIS — Z79899 Other long term (current) drug therapy: Secondary | ICD-10-CM | POA: Insufficient documentation

## 2015-11-30 DIAGNOSIS — F17220 Nicotine dependence, chewing tobacco, uncomplicated: Secondary | ICD-10-CM | POA: Insufficient documentation

## 2015-11-30 DIAGNOSIS — F419 Anxiety disorder, unspecified: Secondary | ICD-10-CM | POA: Diagnosis not present

## 2015-11-30 DIAGNOSIS — Z5321 Procedure and treatment not carried out due to patient leaving prior to being seen by health care provider: Secondary | ICD-10-CM | POA: Diagnosis not present

## 2015-11-30 NOTE — ED Triage Notes (Signed)
Pt states hx of PTSD.  Not sleeping.  Difficult home life right now.  Pt states when he is awake he doesn't think of harming self.  When asleep he does have thoughts of harming self.  Not sleeping well at all.  Poor appetite.  Forces self to eat.

## 2015-11-30 NOTE — ED Notes (Signed)
Pt denies needing help with SI.  Pt family wants him admission for etoh.

## 2015-11-30 NOTE — ED Notes (Signed)
Pt decided not to stay

## 2016-04-08 NOTE — BH Assessment (Signed)
Tele Assessment Note  PER ASSESSMENT BY AVA STEVENSON:  '"Samuel [name] is an 36 y.o. male that reports increased depression associated with his wife threatening to divorce him and asking him to leave her home due to his alcoholism.   Patient report that he recently relapse.  Patient reports that he last time that he drank was yesterday.  Patient denies withdrawal symptoms.  Patient reports that he has been clean for one year.      Patient denies SI/HI/Psychosis.  Patient reports that he was talking about death to his sister but he did not mean to imply that he wanted to die.  Patient reports that he has been with his wife for five years and he has been married for two years      Patient reports additional stressors to include not being able to stay employed.  Patient reports that his current job is not paying him his correct amoun tot money.   Collateral information obtained by the patient wife reports that he has been telling her and his father that he wants to kill himself.  Patient is also not taking his psychiatric medication."    Diagnosis: Alcohol Use Disorder, Severe; Alcohol-induced Mood Disorder  Past Medical History:  Past Medical History:  Diagnosis Date  . Alcohol abuse   . Diverticulitis of colon with bleeding 2010   Hospitalized in 2010 for "intestinal infection requiring IV antibiotics"  . Dysphagia   . Elevated LFTs   . GERD (gastroesophageal reflux disease)   . H/O hiatal hernia    pt states he had an EGD at age 21 and had a hiatal hernia  . Hypoglycemia    recurrent  . PTSD (post-traumatic stress disorder)     Past Surgical History:  Procedure Laterality Date  . COLONOSCOPY  05/15/2011   Procedure: COLONOSCOPY;  Surgeon: Irene Shipper, MD;  Location: [facility] ENDOSCOPY;  Service: Endoscopy;  Laterality: N/A;  . ESOPHAGOGASTRODUODENOSCOPY     age 35  . ESOPHAGOGASTRODUODENOSCOPY  05/15/2011   Procedure: ESOPHAGOGASTRODUODENOSCOPY (EGD);  Surgeon: Irene Shipper, MD;   Location: [facility] ENDOSCOPY;  Service: Endoscopy;  Laterality: N/A;  . GIVENS CAPSULE STUDY  05/30/2011   Procedure: GIVENS CAPSULE STUDY;  Surgeon: Irene Shipper, MD;  Location: [facility];  Service: Endoscopy;  Laterality: N/A;  Givens Capsule study.  . TONSILLECTOMY      Family History:  Family History  Problem Relation Age of Onset  . Diabetes type II Mother   . Emphysema Father   . Heart attack Father   . Stroke Father   . Diabetes type II Other   . Diabetes type II Maternal Aunt     Social History:  reports that he has never smoked. His smokeless tobacco use includes Chew. He reports that he drinks about 1.8 oz of alcohol per week . He reports that he does not use drugs.  Additional Social History:  Alcohol / Drug Use Pain Medications: None Prescriptions: No home medications Over the Counter: Zantac History of alcohol / drug use?: Yes Longest period of sobriety (when/how long): One year Negative Consequences of Use: Personal relationships Withdrawal Symptoms:  (Denies current withdrawal symptoms) Substance #1 Name of Substance 1: Alcohol 1 - Age of First Use: Adolescent 1 - Amount (size/oz): Unknown 1 - Frequency: Unknown 1 - Duration: Unknown 1 - Last Use / Amount: 04/07/2016  CIWA:   COWS:    PATIENT STRENGTHS: (choose at least two) Ability for insight Average or above average intelligence Capable of  independent living Communication skills Physical Health Supportive family/friends  Allergies: No Known Allergies  Home Medications:  (Not in a hospital admission)  OB/GYN Status:  No LMP for male patient.  General Assessment Data Location of Assessment: [facility] Assessment Services [facility]) TTS Assessment: Out of system Is this a Tele or Face-to-Face Assessment?: Tele Assessment Is this an Initial Assessment or a Re-assessment for this encounter?: Initial Assessment Marital status: Married [place] name: NA Is patient pregnant?: No Pregnancy Status:  No Living Arrangements: Spouse/significant other Can pt return to current living arrangement?: Yes Admission Status: Involuntary Is patient capable of signing voluntary admission?: Yes Referral Source: Self/Family/Friend Insurance type: Medicaid     Crisis Care Plan Living Arrangements: Spouse/significant other Legal Guardian:  (Self) Name of Psychiatrist: None Name of Therapist: None  Education Status Is patient currently in school?: No Current Grade: NA Highest grade of school patient has completed: NA Name of school: NA Contact person: NA  Risk to self with the past 6 months Suicidal Ideation: Yes-Currently Present (Pt denies, wife reports Pt verbalized SI) Has patient been a risk to self within the past 6 months prior to admission? : Yes Suicidal Intent: No Has patient had any suicidal intent within the past 6 months prior to admission? : No Is patient at risk for suicide?: Yes Suicidal Plan?: No Has patient had any suicidal plan within the past 6 months prior to admission? : No Access to Means: No What has been your use of drugs/alcohol within the last 12 months?: Pt is abusing alcohol Previous Attempts/Gestures: No How many times?: 0 Other Self Harm Risks: None Triggers for Past Attempts: None known Intentional Self Injurious Behavior: None Family Suicide History: Unknown Recent stressful life event(s): Job Loss Persecutory voices/beliefs?: No Depression: Yes Depression Symptoms: Despondent, Guilt, Loss of interest in usual pleasures Substance abuse history and/or treatment for substance abuse?: Yes Suicide prevention information given to non-admitted patients: Not applicable  Risk to Others within the past 6 months Homicidal Ideation: No Does patient have any lifetime risk of violence toward others beyond the six months prior to admission? : No Thoughts of Harm to Others: No Current Homicidal Intent: No Current Homicidal Plan: No Access to Homicidal Means:  No Identified Victim: None History of harm to others?: No Assessment of Violence: None Noted Violent Behavior Description: None noted Does patient have access to weapons?: No Criminal Charges Pending?: No Does patient have a court date: No Is patient on probation?: No  Psychosis Hallucinations: None noted Delusions: None noted  Mental Status Report Appearance/Hygiene:  (Unshaven, underweight) Eye Contact: Fair Motor Activity: Unremarkable Speech: Logical/coherent Level of Consciousness: Alert Mood: Sad Affect: Sad Anxiety Level: None Thought Processes: Coherent, Relevant Judgement: Unimpaired Orientation: Person, Place, Time, Situation, Appropriate for developmental age Obsessive Compulsive Thoughts/Behaviors: None  Cognitive Functioning Concentration: Normal Memory: Recent Intact, Remote Intact IQ: Average Insight: Good Impulse Control: Good Appetite: Fair Weight Loss: 0 Weight Gain: 0 Sleep: No Change Total Hours of Sleep: 8 Vegetative Symptoms: None  ADLScreening [facility] Assessment Services) Patient's cognitive ability adequate to safely complete daily activities?: Yes Patient able to express need for assistance with ADLs?: Yes Independently performs ADLs?: Yes (appropriate for developmental age)  Prior Inpatient Therapy Prior Inpatient Therapy:  (Unknown) Prior Therapy Dates: NA Prior Therapy Facilty/Provider(s): NA Reason for Treatment: NA  Prior Outpatient Therapy Prior Outpatient Therapy:  (Unknown) Prior Therapy Dates: NA Prior Therapy Facilty/Provider(s): NA Reason for Treatment: NA Does patient have an ACCT team?: No Does patient have Intensive  In-House Services?  : No Does patient have [facility] services? : No Does patient have P4CC services?: No  ADL Screening (condition at time of admission) Patient's cognitive ability adequate to safely complete daily activities?: Yes Is the patient deaf or have difficulty hearing?: No Does the patient have  difficulty seeing, even when wearing glasses/contacts?: No Does the patient have difficulty concentrating, remembering, or making decisions?: No Patient able to express need for assistance with ADLs?: Yes Does the patient have difficulty dressing or bathing?: No Independently performs ADLs?: Yes (appropriate for developmental age) Does the patient have difficulty walking or climbing stairs?: No Weakness of Legs: None Weakness of Arms/Hands: None  Home Assistive Devices/Equipment Home Assistive Devices/Equipment: None    Abuse/Neglect Assessment (Assessment to be complete while patient is alone) Physical Abuse: Denies Verbal Abuse: Denies Sexual Abuse: Denies Exploitation of patient/patient's resources: Denies Self-Neglect: Denies     Regulatory affairs officer (For Healthcare) Does Patient Have a Medical Advance Directive?: No Would patient like information on creating a medical advance directive?: No - Patient declined    Additional Information 1:1 In Past 12 Months?: No CIRT Risk: No Elopement Risk: No Does patient have medical clearance?: Yes      Disposition Initial Assessment Completed for this Encounter: Yes Disposition of Patient: Inpatient treatment program Type of inpatient treatment program: Adult   Disposition: Patient accepted to [facility] by Dr. Ralene Bathe, [facility] approves   Patient to go in bed 300-2   Admitting physician Dr. Parke Poisson   Call report to [phone]   Patient to come voluntarily  Orpah Greek Anson Fret, [facility], [facility], [facility] Triage Specialist [phone]   Evelena Peat 04/08/2016 9:52 PM

## 2016-04-09 ENCOUNTER — Inpatient Hospital Stay (HOSPITAL_COMMUNITY)
Admission: AD | Admit: 2016-04-09 | Discharge: 2016-04-13 | DRG: 885 | Disposition: A | Discharge status: Home / Self Care | Payer: Medicaid Other | Source: Intra-hospital | Attending: Psychiatry | Admitting: Psychiatry

## 2016-04-09 ENCOUNTER — Encounter (HOSPITAL_COMMUNITY): Payer: Self-pay

## 2016-04-09 DIAGNOSIS — F1020 Alcohol dependence, uncomplicated: Secondary | ICD-10-CM | POA: Diagnosis present

## 2016-04-09 DIAGNOSIS — Z9889 Other specified postprocedural states: Secondary | ICD-10-CM | POA: Diagnosis not present

## 2016-04-09 DIAGNOSIS — F13239 Sedative, hypnotic or anxiolytic dependence with withdrawal, unspecified: Secondary | ICD-10-CM | POA: Diagnosis present

## 2016-04-09 DIAGNOSIS — R45851 Suicidal ideations: Secondary | ICD-10-CM | POA: Diagnosis present

## 2016-04-09 DIAGNOSIS — F322 Major depressive disorder, single episode, severe without psychotic features: Principal | ICD-10-CM | POA: Diagnosis present

## 2016-04-09 DIAGNOSIS — Z8249 Family history of ischemic heart disease and other diseases of the circulatory system: Secondary | ICD-10-CM | POA: Diagnosis not present

## 2016-04-09 DIAGNOSIS — F418 Other specified anxiety disorders: Secondary | ICD-10-CM | POA: Diagnosis not present

## 2016-04-09 DIAGNOSIS — Z823 Family history of stroke: Secondary | ICD-10-CM | POA: Diagnosis not present

## 2016-04-09 DIAGNOSIS — F329 Major depressive disorder, single episode, unspecified: Secondary | ICD-10-CM | POA: Diagnosis present

## 2016-04-09 DIAGNOSIS — Z79899 Other long term (current) drug therapy: Secondary | ICD-10-CM | POA: Diagnosis not present

## 2016-04-09 DIAGNOSIS — F17220 Nicotine dependence, chewing tobacco, uncomplicated: Secondary | ICD-10-CM | POA: Diagnosis present

## 2016-04-09 DIAGNOSIS — Z833 Family history of diabetes mellitus: Secondary | ICD-10-CM | POA: Diagnosis not present

## 2016-04-09 LAB — CBC WITH DIFFERENTIAL/PLATELET
BASOS PCT: 0 %
Basophils Absolute: 0 10*3/uL (ref 0.0–0.1)
EOS ABS: 0 10*3/uL (ref 0.0–0.7)
EOS PCT: 0 %
HCT: 40.7 % (ref 39.0–52.0)
Hemoglobin: 14.2 g/dL (ref 13.0–17.0)
Lymphocytes Relative: 19 %
Lymphs Abs: 1.2 10*3/uL (ref 0.7–4.0)
MCH: 30.5 pg (ref 26.0–34.0)
MCHC: 34.9 g/dL (ref 30.0–36.0)
MCV: 87.3 fL (ref 78.0–100.0)
MONOS PCT: 6 %
Monocytes Absolute: 0.4 10*3/uL (ref 0.1–1.0)
Neutro Abs: 4.8 10*3/uL (ref 1.7–7.7)
Neutrophils Relative %: 75 %
Platelets: 236 10*3/uL (ref 150–400)
RBC: 4.66 MIL/uL (ref 4.22–5.81)
RDW: 13 % (ref 11.5–15.5)
WBC: 6.4 10*3/uL (ref 4.0–10.5)

## 2016-04-09 LAB — HEPATIC FUNCTION PANEL
ALK PHOS: 74 U/L (ref 38–126)
ALT: 22 U/L (ref 17–63)
AST: 44 U/L — AB (ref 15–41)
Albumin: 4.6 g/dL (ref 3.5–5.0)
BILIRUBIN DIRECT: 0.3 mg/dL (ref 0.1–0.5)
Indirect Bilirubin: 0.4 mg/dL (ref 0.3–0.9)
Total Bilirubin: 0.7 mg/dL (ref 0.3–1.2)
Total Protein: 7.2 g/dL (ref 6.5–8.1)

## 2016-04-09 LAB — RAPID URINE DRUG SCREEN, HOSP PERFORMED
Amphetamines: NOT DETECTED
Barbiturates: NOT DETECTED
Benzodiazepines: POSITIVE — AB
Cocaine: NOT DETECTED
OPIATES: NOT DETECTED
Tetrahydrocannabinol: NOT DETECTED

## 2016-04-09 LAB — ETHANOL

## 2016-04-09 MED ORDER — ACETAMINOPHEN 325 MG PO TABS: 650.0000 mg | ORAL_TABLET | Freq: Four times a day (QID) | ORAL | Status: DC | PRN

## 2016-04-09 MED ORDER — ADULT MULTIVITAMIN W/MINERALS CH
ORAL_TABLET | ORAL | Status: AC
  Filled 2016-04-09: qty 1

## 2016-04-09 MED ORDER — LORAZEPAM 1 MG PO TABS: 1.0000 mg | ORAL_TABLET | Freq: Three times a day (TID) | ORAL | Status: DC

## 2016-04-09 MED ORDER — LOPERAMIDE HCL 2 MG PO CAPS: 2.0000 mg | ORAL_CAPSULE | ORAL | Status: AC | PRN

## 2016-04-09 MED ORDER — ALUM & MAG HYDROXIDE-SIMETH 200-200-20 MG/5ML PO SUSP: 30.0000 mL | ORAL | Status: DC | PRN

## 2016-04-09 MED ORDER — ONDANSETRON 4 MG PO TBDP: 4.0000 mg | ORAL_TABLET | Freq: Four times a day (QID) | ORAL | Status: AC | PRN

## 2016-04-09 MED ORDER — LORAZEPAM 1 MG PO TABS
1.0000 mg | ORAL_TABLET | Freq: Four times a day (QID) | ORAL | Status: AC
  Administered 2016-04-09 (×4): 1 mg via ORAL
  Filled 2016-04-09 (×3): qty 1

## 2016-04-09 MED ORDER — MAGNESIUM HYDROXIDE 400 MG/5ML PO SUSP: 30.0000 mL | Freq: Every day | ORAL | Status: DC | PRN

## 2016-04-09 MED ORDER — HYDROXYZINE HCL 25 MG PO TABS
25.0000 mg | ORAL_TABLET | Freq: Three times a day (TID) | ORAL | Status: DC | PRN
  Administered 2016-04-09 – 2016-04-12 (×4): 25 mg via ORAL
  Filled 2016-04-09: qty 10
  Filled 2016-04-09 (×2): qty 1
  Filled 2016-04-09: qty 10
  Filled 2016-04-09 (×2): qty 1

## 2016-04-09 MED ORDER — LORAZEPAM 1 MG PO TABS: 1.0000 mg | ORAL_TABLET | Freq: Four times a day (QID) | ORAL | Status: DC | PRN

## 2016-04-09 MED ORDER — ADULT MULTIVITAMIN W/MINERALS CH
1.0000 | ORAL_TABLET | Freq: Every day | ORAL | Status: DC
  Administered 2016-04-09 – 2016-04-13 (×5): 1 via ORAL
  Filled 2016-04-09 (×8): qty 1

## 2016-04-09 MED ORDER — TRAZODONE HCL 50 MG PO TABS
50.0000 mg | ORAL_TABLET | Freq: Every evening | ORAL | Status: DC | PRN
  Administered 2016-04-09: 50 mg via ORAL
  Filled 2016-04-09: qty 7
  Filled 2016-04-09: qty 1

## 2016-04-09 MED ORDER — LORAZEPAM 1 MG PO TABS
1.0000 mg | ORAL_TABLET | Freq: Two times a day (BID) | ORAL | Status: DC
Start: 2016-04-11 — End: 2016-04-10

## 2016-04-09 MED ORDER — MIRTAZAPINE 15 MG PO TABS
15.0000 mg | ORAL_TABLET | Freq: Every day | ORAL | Status: DC
  Administered 2016-04-09 – 2016-04-10 (×2): 15 mg via ORAL
  Filled 2016-04-09 (×5): qty 1

## 2016-04-09 MED ORDER — VITAMIN B-1 100 MG PO TABS
100.0000 mg | ORAL_TABLET | Freq: Every day | ORAL | Status: DC
Start: 2016-04-09 — End: 2016-04-13
  Administered 2016-04-09 – 2016-04-13 (×5): 100 mg via ORAL
  Filled 2016-04-09 (×7): qty 1

## 2016-04-09 MED ORDER — THIAMINE HCL 100 MG/ML IJ SOLN: 100.0000 mg | Freq: Once | INTRAMUSCULAR | Status: DC

## 2016-04-09 MED ORDER — LORAZEPAM 1 MG PO TABS
ORAL_TABLET | ORAL | Status: AC
  Filled 2016-04-09: qty 1

## 2016-04-09 MED ORDER — VITAMIN B-1 100 MG PO TABS
ORAL_TABLET | ORAL | Status: AC
  Filled 2016-04-09: qty 1

## 2016-04-09 MED ORDER — VITAMIN B-1 100 MG PO TABS: 100.0000 mg | ORAL_TABLET | Freq: Every day | ORAL | Status: DC

## 2016-04-09 MED ORDER — LORAZEPAM 1 MG PO TABS: 1.0000 mg | ORAL_TABLET | Freq: Every day | ORAL | Status: DC

## 2016-04-09 NOTE — Progress Notes (Signed)
D.  Pt pleasant on approach, requested to know if his lab work returned normal.  Pt attended evening AA group, observed interacting minimally but appropriately with peers on unit.  Pt denies SI/HI/hallucinations at this time.  A. Support and encouragement offered, medication given as ordered  R.  Pt remains safe on the unit, will continue to monitor.

## 2016-04-09 NOTE — Progress Notes (Signed)
Data. Patient denies SI/HI/AVH. Patient interacting well with staff and other patients. Did C/O being, "Very tired" this AM and napped at times. Patient has denied detox symptoms through out shift. His affect has been flat, but does brighten with interaction. On his self assessment patient reports 6/10 for depression, 10/10 for hopelessness and 8/10 for anxiety. His goal for today is: "Rest and get energy back."  Action. Emotional support and encouragement offered. Education provided on medication, indications and side effect. Q 15 minute checks done for safety. Response. Safety on the unit maintained through 15 minute checks.  Medications taken as prescribed. Attended groups. Remained calm and appropriate through out shift.

## 2016-04-09 NOTE — [facility] Group Notes (Signed)
[facility] Notes:  (Nursing/MHT/Case Management/Adjunct)  Date:  04/09/2016  Time:  2:17 PM  Type of Therapy:  Nurse Education  Participation Level:  Active  Participation Quality:  Appropriate and Attentive  Affect:  Appropriate  Cognitive:  Alert and Appropriate  Insight:  Appropriate and Improving  Engagement in Group:  Engaged  Modes of Intervention:  Education and Problem-solving  Summary of Progress/Problems:  Patient was able to complete his suicide safety plan.   Cheri Kearns 04/09/2016, 2:17 PM

## 2016-04-09 NOTE — Tx Team (Signed)
Initial Treatment Plan 04/09/2016 1:35 AM Samuel [name] [id number]    PATIENT STRESSORS: Financial difficulties Marital or family conflict Occupational concerns Substance abuse   PATIENT STRENGTHS: Capable of independent living Curator fund of knowledge Motivation for treatment/growth Physical Health   PATIENT IDENTIFIED PROBLEMS: Depression  Anxiety   Substance abuse  Suicidal statements (vague)  "Get my mind off of things and get a fresh start"  "Get more realistic about what I need"  "Help with medicines that might help better"  "I would like some resources for when I leave"       DISCHARGE CRITERIA:  Improved stabilization in mood, thinking, and/or behavior Verbal commitment to aftercare and medication compliance  PRELIMINARY DISCHARGE PLAN: Outpatient therapy Medication management  PATIENT/FAMILY INVOLVEMENT: This treatment plan has been presented to and reviewed with the patient, Samuel [name].  The patient and family have been given the opportunity to ask questions and make suggestions.  Windell Moment, RN 04/09/2016, 1:35 AM
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
## 2016-04-09 NOTE — BHH Group Notes (Addendum)
[place] LCSW Group Therapy Note  04/09/2016  At 10 - 11 AM  Type of Therapy and Topic:  Group Therapy: Avoiding Self-Sabotaging and Enabling Behaviors  Participation Level:  Active  Participation Quality:  Attentive and Sharing  Affect:  Flat  Cognitive:  Alert and Oriented  Insight:  Developing/Improving  Engagement in Therapy:  Developing/Improving   Therapeutic models used Cognitive Behavioral Therapy Person-Centered Therapy Motivational Interviewing   Summary of Patient Progress: The main focus of today's process group was to explain to the adolescent what "self-sabotage" means and use Motivational Interviewing to discuss what benefits, negative or positive, were involved in a self-identified self-sabotaging behavior. We then talked about reasons the patient may want to change the behavior and their current desire to change. Patient shared how substance abuse has ruined relationships. He was able to process how isolation following recent treatment program likely led to his relapse.   Sheilah Pigeon, LCSW

## 2016-04-09 NOTE — H&P (Signed)
Psychiatric Admission Assessment Adult  Patient Identification: Samuel [name] MRN:  [id number] Date of Evaluation:  04/09/2016 Chief Complaint:  MDD  ALCOHOL ABUSE DISORDER Principal Diagnosis: Severe major depression ([place]) Diagnosis:   Patient Active Problem List   Diagnosis Date Noted  . 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 Malnutrition of moderate degree ([place]) [E44.0] 09/17/2014  . Alcohol withdrawal ([place]) [F10.239] 09/16/2014  . ETOH abuse [F10.10]   . Depression [F32.9]   . PTSD (post-traumatic stress disorder) [F43.10]   . Intractable nausea and vomiting [R11.2] 04/08/2014  . Transaminitis [R74.0] 08/26/2013  . Hypoglycemia [E16.2] 08/26/2013  . 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 Leukocytosis [D72.829] 05/28/2012  . 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 GERD (gastroesophageal reflux disease) [K21.9] 05/13/2011  . Alcohol abuse [F10.10] 05/13/2011  . Fatty liver [K76.0] 05/13/2011   History of Present Illness: Per BHH Assessment-'"Samuel [name] is an 36 y.o. male that reports increased depression associated with his wife threatening to divorce him and asking him to leave her home due to his alcoholism.   Patient report that he recently relapse.  Patient reports that he last time that he drank was yesterday.  Patient denies withdrawal symptoms.  Patient reports that he has been clean for one year. Patient denies SI/HI/Psychosis.  Patient reports that he was talking about death to his sister but he did not mean to imply that he wanted to die.  Patient reports that he has been with his wife for five years and he has been married for two years    Patient reports additional stressors to include not being able to stay employed.  Patient reports that his current job is not paying him his correct amoun tot money.  Collateral information obtained by the patient wife reports that he has been telling her and  his father that he wants to kill himself.  Patient is also not taking his psychiatric medication."  On Evaluation: Samuel [name] is awake, alert and oriented *3. Seen pacing the calls. Denies suicidal or homicidal ideation during this assessment. Denies auditory or visual hallucination and does not appear to be responding to internal stimuli. Patient validates the information that was provided in the [facility] assessment Support, encouragement and reassurance was provided.   Associated Signs/Symptoms: Depression Symptoms:  depressed mood, insomnia, fatigue, hopelessness, (Hypo) Manic Symptoms:  Irritable Mood, Anxiety Symptoms:  Excessive Worry, Social Anxiety, Psychotic Symptoms:  Hallucinations: None PTSD Symptoms: Hyperarousal:  Difficulty Concentrating Irritability/Anger Total Time spent with patient: 30 minutes  Past Psychiatric History:   Is the patient at risk to self? Yes.    Has the patient been a risk to self in the past 6 months? Yes.    Has the patient been a risk to self within the distant past? Yes.    Is the patient a risk to others? No.  Has the patient been a risk to others in the past 6 months? No.  Has the patient been a risk to others within the distant past? No.   Prior Inpatient Therapy: Prior Inpatient Therapy:  (Unknown) Prior Therapy Dates: NA Prior Therapy Facilty/Provider(s): NA Reason for Treatment: NA Prior Outpatient Therapy: Prior Outpatient Therapy:  (Unknown) Prior Therapy Dates: NA Prior Therapy Facilty/Provider(s): NA Reason for Treatment: NA Does patient have an ACCT team?: No Does patient have Intensive In-House Services?  : No Does patient have [facility] services? : No Does patient have P4CC services?: No  Alcohol Screening:  1. How often do you have a drink containing alcohol?: 2 to 4 times a month 2. How many drinks containing alcohol do you have on a typical day when you are drinking?: 5 or 6 3. How often do you have six or more drinks on one  occasion?: Weekly Preliminary Score: 5 4. How often during the last year have you found that you were not able to stop drinking once you had started?: Never 5. How often during the last year have you failed to do what was normally expected from you becasue of drinking?: Never 6. How often during the last year have you needed a first drink in the morning to get yourself going after a heavy drinking session?: Never 7. How often during the last year have you had a feeling of guilt of remorse after drinking?: Less than monthly 8. How often during the last year have you been unable to remember what happened the night before because you had been drinking?: Never 9. Have you or someone else been injured as a result of your drinking?: No 10. Has a relative or friend or a doctor or another health worker been concerned about your drinking or suggested you cut down?: Yes, during the last year Alcohol Use Disorder Identification Test Final Score (AUDIT): 12 Brief Intervention: Yes Substance Abuse History in the last 12 months:  Yes.   Consequences of Substance Abuse: Withdrawal Symptoms:   Headaches None Previous Psychotropic Medications: no Psychological Evaluations: no Past Medical History:  Past Medical History:  Diagnosis Date  . Alcohol abuse   . Diverticulitis of colon with bleeding 2010   Hospitalized in 2010 for "intestinal infection requiring IV antibiotics"  . Dysphagia   . Elevated LFTs   . GERD (gastroesophageal reflux disease)   . H/O hiatal hernia    pt states he had an EGD at age 55 and had a hiatal hernia  . Hypoglycemia    recurrent  . PTSD (post-traumatic stress disorder)     Past Surgical History:  Procedure Laterality Date  . COLONOSCOPY  05/15/2011   Procedure: COLONOSCOPY;  Surgeon: Irene Shipper, MD;  Location: [facility] ENDOSCOPY;  Service: Endoscopy;  Laterality: N/A;  . ESOPHAGOGASTRODUODENOSCOPY     age 18  . ESOPHAGOGASTRODUODENOSCOPY  05/15/2011   Procedure:  ESOPHAGOGASTRODUODENOSCOPY (EGD);  Surgeon: Irene Shipper, MD;  Location: [facility] ENDOSCOPY;  Service: Endoscopy;  Laterality: N/A;  . GIVENS CAPSULE STUDY  05/30/2011   Procedure: GIVENS CAPSULE STUDY;  Surgeon: Irene Shipper, MD;  Location: [facility];  Service: Endoscopy;  Laterality: N/A;  Givens Capsule study.  . TONSILLECTOMY     Family History:  Family History  Problem Relation Age of Onset  . Diabetes type II Mother   . Emphysema Father   . Heart attack Father   . Stroke Father   . Diabetes type II Other   . Diabetes type II Maternal Aunt    Family Psychiatric  History:  Tobacco Screening: Have you used any form of tobacco in the last 30 days? (Cigarettes, Smokeless Tobacco, Cigars, and/or Pipes): Yes Tobacco use, Select all that apply: smokeless tobacco use daily Are you interested in Tobacco Cessation Medications?: No, patient refused Counseled patient on smoking cessation including recognizing danger situations, developing coping skills and basic information about quitting provided: Refused/Declined practical counseling Social History:  History  Alcohol Use  . 1.8 oz/week  . 3 Cans of beer per week    Comment: average six 12oz beers/day, ranges from 3-12 12oz/day  History  Drug Use No    Additional Social History: Marital status: Married    Pain Medications: None Prescriptions: No home medications Over the Counter: Zantac History of alcohol / drug use?: Yes Longest period of sobriety (when/how long): One year Negative Consequences of Use: Personal relationships Withdrawal Symptoms:  (Denies current withdrawal symptoms) Name of Substance 1: Alcohol 1 - Age of First Use: Adolescent 1 - Amount (size/oz): 6 beers 1 - Frequency: for the past four days 1 - Duration: 4 days 1 - Last Use / Amount: 6 beers 04/07/16                  Allergies:  No Known Allergies Lab Results: No results found for this or any previous visit (from the past 95 hour(s)).  Blood  Alcohol level:  Lab Results  Component Value Date   ETH <5 10/03/2015   ETH 104 (H) [id number]    Metabolic Disorder Labs:  Lab Results  Component Value Date   HGBA1C 5.5 04/23/2014   MPG 111 04/23/2014   MPG 105 04/08/2014   No results found for: PROLACTIN No results found for: CHOL, TRIG, HDL, CHOLHDL, VLDL, LDLCALC  Current Medications: Current Facility-Administered Medications  Medication Dose Route Frequency Provider Last Rate Last Dose  . acetaminophen (TYLENOL) tablet 650 mg  650 mg Oral Q6H PRN Rozetta Nunnery, NP      . alum & mag hydroxide-simeth (MAALOX/MYLANTA) 200-200-20 MG/5ML suspension 30 mL  30 mL Oral Q4H PRN Rozetta Nunnery, NP      . hydrOXYzine (ATARAX/VISTARIL) tablet 25 mg  25 mg Oral TID PRN Rozetta Nunnery, NP   25 mg at 04/09/16 0145  . magnesium hydroxide (MILK OF MAGNESIA) suspension 30 mL  30 mL Oral Daily PRN Rozetta Nunnery, NP      . traZODone (DESYREL) tablet 50 mg  50 mg Oral QHS PRN Rozetta Nunnery, NP   50 mg at 04/09/16 0145   PTA Medications: Prescriptions Prior to Admission  Medication Sig Dispense Refill Last Dose  . bismuth subsalicylate (PEPTO BISMOL) 262 MG chewable tablet Chew 524 mg by mouth 3 (three) times daily as needed for indigestion.   10/03/2015 at Unknown time  . feeding supplement, ENSURE ENLIVE, (ENSURE ENLIVE) LIQD Take 237 mLs by mouth 2 (two) times daily between meals. (Patient not taking: Reported on 04/29/2015) 237 mL 12 Not Taking at Unknown time  . FLUoxetine (PROZAC) 10 MG capsule Take 1 capsule (10 mg total) by mouth daily. (Patient not taking: Reported on 04/29/2015) 30 capsule 3 Not Taking at Unknown time  . ibuprofen (ADVIL,MOTRIN) 200 MG tablet Take 400 mg by mouth every 6 (six) hours as needed for moderate pain.   Past Week at Unknown time  . mirtazapine (REMERON) 7.5 MG tablet Take 1 tablet (7.5 mg total) by mouth at bedtime. (Patient not taking: Reported on 04/29/2015) 30 tablet 0 Not Taking at Unknown time  . ranitidine  (ZANTAC) 150 MG tablet Take 150 mg by mouth daily as needed for heartburn.   10/02/2015 at Unknown time    Musculoskeletal: Strength & Muscle Tone: within normal limits Gait & Station: normal Patient leans: N/A  Psychiatric Specialty Exam: Physical Exam  Nursing note and vitals reviewed. Constitutional: He is oriented to person, place, and time.  Neurological: He is alert and oriented to person, place, and time.  Psychiatric: He has a normal mood and affect. His behavior is normal.    Review of Systems  Psychiatric/Behavioral:  Positive for depression and substance abuse. Negative for suicidal ideas. The patient is nervous/anxious and has insomnia.     Blood pressure 116/89, pulse 81, temperature 98 F (36.7 C), temperature source Oral, resp. rate 18, height 5\' 7"  (1.702 m), weight 54 kg (119 lb), SpO2 100 %.Body mass index is 18.64 kg/m.  General Appearance: Casual  Eye Contact:  Fair  Speech:  Clear and Coherent  Volume:  Normal  Mood:  Anxious and Depressed  Affect:  Congruent  Thought Process:  Coherent  Orientation:  Full (Time, Place, and Person)  Thought Content:  Hallucinations: None  Suicidal Thoughts:  No  Homicidal Thoughts:  No  Memory:  Immediate;   Fair Recent;   Fair Remote;   Fair  Judgement:  Fair  Insight:  Fair  Psychomotor Activity:  Restlessness  Concentration:  Concentration: Fair  Recall:  [employer] of Knowledge:  Fair  Language:  Fair  Akathisia:  No  Handed:  Right  AIMS (if indicated):     Assets:  Communication Skills Resilience Social Support  ADL's:  Intact  Cognition:  WNL  Sleep:  Number of Hours: 3.75     I agree with current treatment plan on 04/09/2016, Patient seen face-to-face for psychiatric evaluation follow-up, chart reviewed and case discussed with the MD Izediuno. Reviewed the information documented and agree with the treatment plan.  Treatment Plan Summary: Daily contact with patient to assess and evaluate symptoms and  progress in treatment and Medication management  Continue with Trazodone 50 mg for insomnia Started on CWIA/ Ativan Protocol Will continue to monitor vitals ,medication compliance and treatment side effects while patient is here.  Reviewed labs:AST/ALT elevated ,BAL , UDS - Labs placed for Hep B hep C CSW will start working on disposition.  Patient to participate in therapeutic milieu   Observation Level/Precautions:  15 minute checks  Laboratory:  CBC Chemistry Profile UDS UA  Psychotherapy:  Individual and group session  Medications:  See about  Consultations:  Psychiatry  Discharge Concerns:  Safety, stabilization, and risk of access to medication and medication stabilization   Estimated LOS: 5-7day  Other:     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[place]) Long Term Goal(s): Improvement in symptoms so as ready for discharge  Short Term Goals: Ability to identify changes in lifestyle to reduce recurrence of condition will improve, Ability to disclose and discuss suicidal ideas, Ability to demonstrate self-control will improve and Compliance with prescribed medications will improve  Physician Treatment Plan for Secondary Diagnosis: Principal Problem:   Severe major depression ([place])  Long Term Goal(s): Improvement in symptoms so as ready for discharge  Short Term Goals: Ability to verbalize feelings will improve, Ability to identify and develop effective coping behaviors will improve, Compliance with prescribed medications will improve and Ability to identify triggers associated with substance abuse/mental health issues will improve  I certify that inpatient services furnished can reasonably be expected to improve the patient's condition.    Derrill [place], NP 2/3/20187:57 AM

## 2016-04-09 NOTE — Progress Notes (Signed)
Samuel [name] is a year old male being admitted voluntarily to [facility] from [facility] ED.  He came to the ED for depression and drinking.  He made vague statements for suicidal ideation but adamantly denies any suicidal ideation.  He was clean since August 2017 and relapsed 4 days ago.  He drank 2 (12 packs) of beer over the past 4 days.  He denies any history of withdrawal symptoms and stated he is feeling ok right now.  "I am just wanting help with depression and help with where I can get my medicines when I leave here."  He is experiencing multiple stressors, he is losing his job, wife is losing insurance, taking care of ailing father and trying to feed his family.  He denies any medical issues and appears to be in no physical distress.  Oriented him to the unit.  Admission paperwork completed and signed.  Belongings searched and secured in locker # 23.  Skin assessment completed and noted right lower arm/forearm.  Q 15 minute checks initiated for safety.  We will monitor the progress towards his goals.

## 2016-04-09 NOTE — Progress Notes (Signed)
Pt attended AA/NA group meeting.

## 2016-04-10 DIAGNOSIS — F17220 Nicotine dependence, chewing tobacco, uncomplicated: Secondary | ICD-10-CM

## 2016-04-10 MED ORDER — CHLORDIAZEPOXIDE HCL 25 MG PO CAPS
25.0000 mg | ORAL_CAPSULE | Freq: Two times a day (BID) | ORAL | Status: AC
  Administered 2016-04-10 – 2016-04-12 (×4): 25 mg via ORAL
  Filled 2016-04-10 (×4): qty 1

## 2016-04-10 MED ORDER — ENSURE ENLIVE PO LIQD
237.0000 mL | Freq: Two times a day (BID) | ORAL | Status: DC
  Administered 2016-04-10 – 2016-04-11 (×3): 237 mL via ORAL

## 2016-04-10 NOTE — Progress Notes (Signed)
NUTRITION ASSESSMENT  Pt identified as at risk on the Malnutrition Screen Tool  INTERVENTION: 1. Supplements: Ensure Enlive po BID, each supplement provides 350 kcal and 20 grams of protein  NUTRITION DIAGNOSIS: Unintentional weight loss related to sub-optimal intake as evidenced by pt report.   Goal: Pt to meet >/= 90% of their estimated nutrition needs.  Monitor:  PO intake  Assessment:  Pt admitted with depression and ETOH abuse. Pt with increased weight since July 2016. Pt would still benefit from nutritional supplementation given lower BMI (18.6). RD will order.   Height: Ht Readings from Last 1 Encounters:  04/09/16 5\' 7"  (1.702 m)    Weight: Wt Readings from Last 1 Encounters:  04/09/16 119 lb (54 kg)    Weight Hx: Wt Readings from Last 10 Encounters:  04/09/16 119 lb (54 kg)  09/16/14 112 lb 3.4 oz (50.9 kg)  06/24/14 117 lb 9.6 oz (53.3 kg)  04/25/14 113 lb 8.6 oz (51.5 kg)  04/08/14 114 lb (51.7 kg)  02/21/14 120 lb (54.4 kg)  08/26/13 121 lb (54.9 kg)  03/03/13 112 lb 3.4 oz (50.9 kg)  06/01/12 115 lb 2 oz (52.2 kg)  05/29/12 117 lb 3.2 oz (53.2 kg)    BMI:  Body mass index is 18.64 kg/m. Pt meets criteria for normal based on current BMI.  Estimated Nutritional Needs: Kcal: 25-30 kcal/kg Protein: > 1 gram protein/kg Fluid: 1 ml/kcal  Diet Order: Diet regular Room service appropriate? Yes; Fluid consistency: Thin Pt is also offered choice of unit snacks mid-morning and mid-afternoon.  Pt is eating as desired.   Lab results and medications reviewed.   Clayton Bibles, MS, RD, LDN Pager: [phone] After Hours Pager: [phone]

## 2016-04-10 NOTE — Progress Notes (Signed)
[facility] MD Progress Note  04/10/2016 11:00 AM Samuel [name]  MRN:  [id number] Subjective:   36 yo Caucasian male, married, lives with his family. Background history of Alcohol use disorder and MDD. Patient presented to the ER via the police. His wife called after they had an argument. His wife reports that patient was expressing thoughts of suicide. Patient has not been eating and has lost significant weight. He was depressed at presentation.  Nursing staff reports that he has been pleasant and appropriate. He has been attending unit groups and activities. He has not expressed any futility thoughts lately. No behavioral issues on the unit. He has been tolerating his medications well. Vital signs are unstable but he is detoxing smoothly.   Seen today. States that he feels better. He slept last night. No nausea, retching or vomiting. No tremors. No hallucination in any modality. No feeling of persecution. No suicidal thoughts. No thoughts of violence. BAL at presentation was <5. However patient reports being on benzodiazepines at home. This could explain increased pulse and blood pressure.  His family visited him yesterday. They are very supportive. Patient is pleased to see that his LFT's are trending down. He is optimistic about the future.   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 Malnutrition of moderate degree ([place]) [E44.0] 09/17/2014  . Alcohol withdrawal ([place]) [F10.239] 09/16/2014  . ETOH abuse [F10.10]   . Depression [F32.9]   . PTSD (post-traumatic stress disorder) [F43.10]   . Intractable nausea and vomiting [R11.2] 04/08/2014  . Transaminitis [R74.0] 08/26/2013  . Hypoglycemia [E16.2] 08/26/2013  . 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 Leukocytosis [D72.829] 05/28/2012  . 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 GERD (gastroesophageal reflux disease) [K21.9] 05/13/2011  . Alcohol abuse [F10.10] 05/13/2011  . Fatty liver [K76.0] 05/13/2011   Total Time spent with patient: 20 minutes  Past Psychiatric History: See H&P  Past Medical History:  Past Medical History:  Diagnosis Date  . Alcohol abuse   . Diverticulitis of colon with bleeding 2010   Hospitalized in 2010 for "intestinal infection requiring IV antibiotics"  . Dysphagia   . Elevated LFTs   . GERD (gastroesophageal reflux disease)   . H/O hiatal hernia    pt states he had an EGD at age 61 and had a hiatal hernia  . Hypoglycemia    recurrent  . PTSD (post-traumatic stress disorder)     Past Surgical History:  Procedure Laterality Date  . COLONOSCOPY  05/15/2011   Procedure: COLONOSCOPY;  Surgeon: Irene Shipper, MD;  [facility]: [facility] ENDOSCOPY;  Service: Endoscopy;  Laterality: N/A;  . ESOPHAGOGASTRODUODENOSCOPY     age 65  . ESOPHAGOGASTRODUODENOSCOPY  05/15/2011   Procedure: ESOPHAGOGASTRODUODENOSCOPY (EGD);  Surgeon: Irene Shipper, MD;  Location: [facility] ENDOSCOPY;  Service: Endoscopy;  Laterality: N/A;  . GIVENS CAPSULE STUDY  05/30/2011   Procedure: GIVENS CAPSULE STUDY;  Surgeon: Irene Shipper, MD;  Location: [place];  Service: Endoscopy;  Laterality: N/A;  Givens Capsule study.  . TONSILLECTOMY     Family History:  Family History  Problem Relation Age of Onset  . Diabetes type II Mother   . Emphysema Father   . Heart attack Father   . Stroke Father   . Diabetes type II Other   . Diabetes type II Maternal Aunt  Family Psychiatric  History: See H&P Social History:  History  Alcohol Use  . 1.8 oz/week  . 3 Cans of beer per week    Comment: average six 12oz beers/day, ranges from 3-12 12oz/day     History  Drug Use No    Social History   Social History  . Marital status: Single    Spouse name: Di Kindle  . Number of children: 4  . Years of education: N/A   Occupational History  . Currently  unemployed on unemployment   . 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 Smoking status: Never Smoker  . Smokeless tobacco: Current User    Types: Chew     Comment: States he has been chewing tobacco almost daily since age 1  . Alcohol use 1.8 oz/week    3 Cans of beer per week     Comment: average six 12oz beers/day, ranges from 3-12 12oz/day  . Drug use: No  . Sexual activity: Yes    Partners: Female   Other Topics Concern  . None   Social History Narrative   Living in [place] since September 2012.   Lives with girlfriend and 4 children (unsure if all are his) at home.   Drinks well water at home.         Additional Social History:    Pain Medications: None Prescriptions: No home medications Over the Counter: Zantac History of alcohol / drug use?: Yes Longest period of sobriety (when/how long): One year Negative Consequences of Use: Personal relationships Withdrawal Symptoms:  (Denies current withdrawal symptoms) Name of Substance 1: Alcohol 1 - Age of First Use: Adolescent 1 - Amount (size/oz): 6 beers 1 - Frequency: for the past four days 1 - Duration: 4 days 1 - Last Use / Amount: 6 beers 04/07/16                  Sleep: Good  Appetite:  Good  Current Medications: Current Facility-Administered Medications  Medication Dose Route Frequency Provider Last Rate Last Dose  . acetaminophen (TYLENOL) tablet 650 mg  650 mg Oral Q6H PRN Rozetta Nunnery, NP      . alum & mag hydroxide-simeth (MAALOX/MYLANTA) 200-200-20 MG/5ML suspension 30 mL  30 mL Oral Q4H PRN Rozetta Nunnery, NP      . feeding supplement (ENSURE ENLIVE) (ENSURE ENLIVE) liquid 237 mL  237 mL Oral BID BM Myer Peer Cobos, MD      . hydrOXYzine (ATARAX/VISTARIL) tablet 25 mg  25 mg Oral TID PRN Rozetta Nunnery, NP   25 mg at 04/09/16 0145  . loperamide (IMODIUM) capsule 2-4 mg  2-4 mg Oral PRN Derrill Center, NP      . LORazepam (ATIVAN) tablet 1 mg  1 mg Oral Q6H  PRN Derrill Center, NP      . LORazepam (ATIVAN) tablet 1 mg  1 mg Oral TID Derrill Center, NP       Followed by  . [START ON 04/11/2016] LORazepam (ATIVAN) tablet 1 mg  1 mg Oral BID Derrill Center, NP       Followed by  . [START ON 04/12/2016] LORazepam (ATIVAN) tablet 1 mg  1 mg Oral Daily Derrill Center, NP      . magnesium hydroxide (MILK OF MAGNESIA) suspension 30 mL  30 mL Oral Daily PRN Rozetta Nunnery, NP      . mirtazapine (REMERON)  tablet 15 mg  15 mg Oral QHS Artist Beach, MD   15 mg at 04/09/16 2110  . multivitamin with minerals tablet 1 tablet  1 tablet Oral Daily Derrill Center, NP   1 tablet at 04/09/16 0815  . ondansetron (ZOFRAN-ODT) disintegrating tablet 4 mg  4 mg Oral Q6H PRN Derrill Center, NP      . thiamine (B-1) injection 100 mg  100 mg Intramuscular Once Derrill Center, NP      . thiamine (VITAMIN B-1) tablet 100 mg  100 mg Oral Daily Encarnacion Slates, NP   100 mg at 04/09/16 0925  . traZODone (DESYREL) tablet 50 mg  50 mg Oral QHS PRN Rozetta Nunnery, NP   50 mg at 04/09/16 0145    Lab Results:  Results for orders placed or performed during the hospital encounter of 04/09/16 (from the past 48 hour(s))  Rapid urine drug screen (hospital performed)     Status: Abnormal   Collection Time: 04/09/16  9:45 AM  Result Value Ref Range   Opiates NONE DETECTED NONE DETECTED   Cocaine NONE DETECTED NONE DETECTED   Benzodiazepines POSITIVE (A) NONE DETECTED   Amphetamines NONE DETECTED NONE DETECTED   Tetrahydrocannabinol NONE DETECTED NONE DETECTED   Barbiturates NONE DETECTED NONE DETECTED    Comment:        DRUG SCREEN FOR MEDICAL PURPOSES ONLY.  IF CONFIRMATION IS NEEDED FOR ANY PURPOSE, NOTIFY LAB WITHIN 5 DAYS.        LOWEST DETECTABLE LIMITS FOR URINE DRUG SCREEN Drug Class       Cutoff (ng/mL) Amphetamine      1000 Barbiturate      200 Benzodiazepine   [id number] Tricyclics       [id number] Opiates          300 Cocaine          300 THC              50 Performed at [facility], [place] [address] [place], [place] [address]   Hepatic function panel     Status: Abnormal   Collection Time: 04/09/16  6:25 PM  Result Value Ref Range   Total Protein 7.2 6.5 - 8.1 g/dL   Albumin 4.6 3.5 - 5.0 g/dL   AST 44 (H) 15 - 41 U/L   ALT 22 17 - 63 U/L   Alkaline Phosphatase 74 38 - 126 U/L   Total Bilirubin 0.7 0.3 - 1.2 mg/dL   Bilirubin, Direct 0.3 0.1 - 0.5 mg/dL   Indirect Bilirubin 0.4 0.3 - 0.9 mg/dL    Comment: Performed at [facility], [place] [address] [place], [place] [address]  CBC with Differential/Platelet     Status: None   Collection Time: 04/09/16  6:25 PM  Result Value Ref Range   WBC 6.4 4.0 - 10.5 K/uL   RBC 4.66 4.22 - 5.81 MIL/uL   Hemoglobin 14.2 13.0 - 17.0 g/dL   HCT 40.7 39.0 - 52.0 %   MCV 87.3 78.0 - 100.0 fL   MCH 30.5 26.0 - 34.0 pg   MCHC 34.9 30.0 - 36.0 g/dL   RDW 13.0 11.5 - 15.5 %   Platelets 236 150 - 400 K/uL   Neutrophils Relative % 75 %   Neutro Abs 4.8 1.7 - 7.7 K/uL   Lymphocytes Relative 19 %   Lymphs Abs 1.2 0.7 - 4.0 K/uL   Monocytes Relative 6 %  Monocytes Absolute 0.4 0.1 - 1.0 K/uL   Eosinophils Relative 0 %   Eosinophils Absolute 0.0 0.0 - 0.7 K/uL   Basophils Relative 0 %   Basophils Absolute 0.0 0.0 - 0.1 K/uL    Comment: Performed at [facility], [place] [address], [place], [place] [address]  Ethanol     Status: None   Collection Time: 04/09/16  6:25 PM  Result Value Ref Range   Alcohol, Ethyl (B) <5 <5 mg/dL    Comment:        LOWEST DETECTABLE LIMIT FOR SERUM ALCOHOL IS 5 mg/dL FOR MEDICAL PURPOSES ONLY Performed at [place] [address], [place], [place] [address]     Blood Alcohol level:  Lab Results  Component Value Date   ETH <5 04/09/2016   ETH <5 [id number]    Metabolic Disorder Labs: Lab Results  Component Value Date   HGBA1C 5.5 04/23/2014   MPG 111 04/23/2014   MPG 105 04/08/2014   No results found  for: PROLACTIN No results found for: CHOL, TRIG, HDL, CHOLHDL, VLDL, LDLCALC  Physical Findings: AIMS: Facial and Oral Movements Muscles of Facial Expression: None, normal Lips and Perioral Area: None, normal Jaw: None, normal Tongue: None, normal,Extremity Movements Upper (arms, wrists, hands, fingers): None, normal Lower (legs, knees, ankles, toes): None, normal, Trunk Movements Neck, shoulders, hips: None, normal, Overall Severity Severity of abnormal movements (highest score from questions above): None, normal Incapacitation due to abnormal movements: None, normal Patient's awareness of abnormal movements (rate only patient's report): No Awareness, Dental Status Current problems with teeth and/or dentures?: No Does patient usually wear dentures?: No  CIWA:  CIWA-Ar Total: 0 COWS:     Musculoskeletal: Strength & Muscle Tone: within normal limits Gait & Station: normal Patient leans: N/A  Psychiatric Specialty Exam: Physical Exam  Constitutional: He is oriented to person, place, and time. He appears well-developed and well-nourished.  HENT:  Head: Normocephalic and atraumatic.  Eyes: Conjunctivae and EOM are normal. Pupils are equal, round, and reactive to light.  Neck: Normal range of motion. Neck supple.  Cardiovascular: Normal rate and regular rhythm.   Respiratory: Effort normal and breath sounds normal.  GI: Soft. Bowel sounds are normal.  Musculoskeletal: Normal range of motion.  Neurological: He is alert and oriented to person, place, and time. He has normal reflexes.  Skin: Skin is warm and dry.  Psychiatric:  As above    ROS  Blood pressure (!) 147/87, pulse (!) 121, temperature 98.2 F (36.8 C), resp. rate 18, height 5\' 7"  (1.702 m), weight 54 kg (119 lb), SpO2 100 %.Body mass index is 18.64 kg/m.  General Appearance: Calm and cooperative. Better psychomotor activity. No tremors, not sweaty. Appropriate behavior.   Eye Contact:  Good  Speech:  Clear and  Coherent and Normal Rate  Volume:  slightly soft.  Mood:  Feels better today  Affect:  Mobilizing more affect today.   Thought Process:  Linear and goal directed  Orientation:  Full (Time, Place, and Person)  Thought Content:  No negative rumination, no thoughts of violence. No perceptual abnormality.   Suicidal Thoughts:  No  Homicidal Thoughts:  No  Memory:  WNL  Judgement:  Better  Insight:  Good  Psychomotor Activity:  Better  Concentration:  Concentration: Good  Recall:  Good  Fund of Knowledge:  Good  Language:  Good  Akathisia:  No  Handed:    AIMS (if indicated):     Assets:  Desire for Improvement Housing Intimacy Physical Health Resilience  ADL's:  Better  Cognition:  WNL  Sleep:  Number of Hours: 6.75     Treatment Plan Summary: Patient is still in withdrawals. This is complicated with chronic benzodiazepine use. LFT's are trending down. We have agreed to use Chlordiazepoxide taper.   1. Switch Ativan to Librium 2. Continue to monitor mood, behavior and interaction with peers 3. Collateral from his family 4. Likely discharge in next two days.   Artist Beach, MD 04/10/2016, 11:00 AM

## 2016-04-10 NOTE — BHH Group Notes (Signed)
[facility] Group Notes:  (Nursing/MHT/Case Management/Adjunct)  Date:  04/10/2016  Time:  1:58 PM  Type of Therapy:  Nurse Education  Participation Level:  Active  Participation Quality:  Appropriate and Attentive  Affect:  Appropriate  Cognitive:  Appropriate  Insight:  Appropriate  Engagement in Group:  Engaged  Modes of Intervention:  Activity  Summary of Progress/Problems:   Patient actively participated in group.   Cheri Kearns 04/10/2016, 1:58 PM

## 2016-04-10 NOTE — Progress Notes (Signed)
D.  Pt pleasant but anxious on approach, no complaints voiced at this time.  Pt would like to know which medications he will be discharged with and will speak to doctor about this in AM.  Pt denies SI/HI/hallucinations at this time.  A,.  Support and encouragement offered, medication given as ordered.  R.  Pt remains safe on the unit, will continue to monitor.

## 2016-04-10 NOTE — BHH Group Notes (Signed)
   [facility] LCSW Group Therapy Note   04/10/2016  10:00 to 10:45 AM   Type of Therapy and Topic: Group Therapy: Feelings Around Returning Home & Establishing a Supportive Framework and Activity to Identify signs of Improvement or Decompensation   Participation Level: Active   Description of Group:  Patients first processed thoughts and feelings about up coming discharge. These included fears of upcoming changes, lack of change, new living environments, judgements and expectations from others and overall stigma of MH issues. We then discussed what is a supportive framework? What does it look like feel like and how do I discern it from and unhealthy non-supportive network? Learn how to cope when supports are not helpful and don't support you. Discuss what to do when your family/friends are not supportive.   Therapeutic Goals Addressed in Processing Group:  1. Patient will identify one healthy supportive network that they can use at discharge. 2. Patient will identify one factor of a supportive framework and how to tell it from an unhealthy network. 3. Patient able to identify one coping skill to use when they do not have positive supports from others. 4. Patient will demonstrate ability to communicate their needs through discussion and/or role plays.  Summary of Patient Progress:  Pt was late arrival yet engaged easily during group session. As patients processed their anxiety about discharge and described healthy supports patient clarified need for more supports, especially those in recovery themselves.  Patient chose a visual to represent decompensation as 'being trapped in the spider web of addiction' and improvement as 'freedom to spend time in nature and appreciate the world we have.'  Sheilah Pigeon, LCSW

## 2016-04-10 NOTE — BHH Counselor (Signed)
Adult Comprehensive Assessment  Patient ID: Samuel [name], male   DOB: 1980-10-20, 36 y.o.   MRN: [id number]  Information Source: Information source: Patient  Current Stressors:  Employment / Job issues: Job is shutting down, causing some stress Family Relationships: Wife and patient under lot of stress due to different things going on Financial / Lack of resources (include bankruptcy): Connected to job shutting down Social relationships: "wife's family members get in our business and try to stir up trouble." Bereavement / Loss: Lost a lot of family members in the last year, not too bad but holidays and birthdays can be a little sad  Living/Environment/Situation:  Living Arrangements: Spouse/significant other, Parent Living conditions (as described by patient or guardian): Lives with wife and 2 kids and taking care of patient's dad How long has patient lived in current situation?: 6 years What is atmosphere in current home: Other (Comment) (Some days it's great, on an average day it can be a little hectic)  Family History:  Marital status: Married Number of Years Married: 2 What types of issues is patient dealing with in the relationship?: Mostly money Are you sexually active?: Yes What is your sexual orientation?: heterosexual Has your sexual activity been affected by drugs, alcohol, medication, or emotional stress?: no Does patient have children?: Yes How many children?: 4 How is patient's relationship with their children?: wife has 4 children, patient has no biological children -  has a good relationship with his step children  Childhood History:  By whom was/is the patient raised?: Both parents Description of patient's relationship with caregiver when they were a child: "I guess it was alright. My dad worked a lot, my mom went to church alot." Patient's description of current relationship with people who raised him/her: Don't talk to mom often, she lives out of state.  Relationship is good with dad, he has a lot of health problems and he lives with patient and family.  How were you disciplined when you got in trouble as a child/adolescent?: "We got whippins! If we did something too wrong." Does patient have siblings?: Yes Number of Siblings: 3 Description of patient's current relationship with siblings: Get along with them fine, but they all live so far away, we don't see each other much.  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 But not sure if the curriculum stands up to standard because it was a church school  Currently a student?: No Name of school: NA Contact person: NA Learning disability?: No  Employment/Work Situation:   Employment situation: Unemployed Patient's job has been impacted by current illness: No (But when depressed he would drink and not think about work) What is the longest time patient has a held a job?: 8 years Where was the patient employed at that time?: [employer] in [place] Has patient ever been in the [employer]?: No Has patient ever served in combat?: No Did You Receive Any Psychiatric Treatment/Services While in Passenger transport manager?: No Are There Guns or Other Weapons in [place]?: Yes Types of Guns/Weapons: [place] and hand guns Are These [place]?: Yes (Not  in the home now)  Financial Resources:   Financial resources: Income from employment Does patient have a representative payee or guardian?: No  Alcohol/Substance Abuse:   What has been your use of drugs/alcohol  within the last 12 months?: Patient was abusing alcohol. Quit for several months and drank 3 12 packs in packs in about 3-4 days on last Thursday If attempted suicide, did drugs/alcohol play a role in this?: No Alcohol/Substance Abuse Treatment Hx: Past Tx, Inpatient, Substance abuse evaluation If yes, describe treatment: Attended a 6 day recovery group Has alcohol/substance abuse ever caused legal problems?: No  Social Support System:   Patient's Community Support System: [employer] System: A lot of it is my wife and she has never used any substances and can't understand addiction Type of faith/religion: Baptist  How does patient's faith help to cope with current illness?: When I'm doing good in the church I don't have these issues. But started missing church and that became an excuse to use, but church does help alot  Leisure/Recreation:   Leisure and Hobbies: Doing things outside. Hiking, fishing, hunting  Strengths/Needs:   What things does the patient do well?: Good at fishing, likes to play guitar and write songs In what areas does patient struggle / problems for patient: Right now just the alcohol  Discharge Plan:   Does patient have access to transportation?: Yes (wife will pick him up) Will patient be returning to same living situation after discharge?: Yes Currently receiving community mental health services: No If no, would patient like referral for services when discharged?: Yes (What county?) [facility], also interested in [facility]) Does patient have financial barriers related to discharge medications?: Yes Patient description of barriers related to discharge medications: No insurance  Summary/Recommendations:   Summary and Recommendations (to be completed by the evaluator): Patient is a 36 year old male who presented to the hospital due to alcohol withdrawal and suicidal ideation. Patient reports primary triggers for admission was family  conflict. Patient will benefit from crisis stabilization medication evaluation, group therapy and psychoeducation in addition to case management for discharge planning. At discharge, it is recommended that patient remain compliant with established discharge plan and continued treatment.  Christene Lye. 04/10/2016

## 2016-04-10 NOTE — BHH Suicide Risk Assessment (Signed)
[facility] INPATIENT:  Family/Significant Other Suicide Prevention Education  Suicide Prevention Education:  Patient Refusal for Family/Significant Other Suicide Prevention Education: The patient Samuel [name] has refused to provide written consent for family/significant other to be provided Family/Significant Other Suicide Prevention Education during admission and/or prior to discharge.  Physician notified.  Christene Lye 04/10/2016, 11:02 AM

## 2016-04-10 NOTE — Progress Notes (Signed)
Data. Patient denies SI/HI/AVH. Patient has been isolating in his room more this shift and his affect is flat/depressed. Patient reports no detox symptoms. He does report, "I'm bored." On his self assessment patient reports 1/10 for depression and 0/10 for hopelessness and anxiety. His goal for today is: "Getting more energy." Action. Emotional support and encouragement offered. Education provided on medication, indications and side effect. Q 15 minute checks done for safety. Response. Safety on the unit maintained through 15 minute checks.  Medications taken as prescribed. Remained calm and appropriate through out shift.

## 2016-04-11 MED ORDER — MIRTAZAPINE 30 MG PO TABS
30.0000 mg | ORAL_TABLET | Freq: Every day | ORAL | Status: DC
  Administered 2016-04-11 – 2016-04-12 (×2): 30 mg via ORAL
  Filled 2016-04-11: qty 7
  Filled 2016-04-11 (×3): qty 1

## 2016-04-11 NOTE — Progress Notes (Signed)
The patient attended this evening's A. A. Meeting and was appropriate.  

## 2016-04-11 NOTE — Plan of Care (Signed)
Problem: Safety: Goal: Periods of time without injury will increase Outcome: Progressing Pt safe on the unit at this time   

## 2016-04-11 NOTE — Progress Notes (Signed)
D: Pt denies SI/HI/AVH. Pt is pleasant and cooperative. 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   A: Pt was offered support and encouragement. Pt was given scheduled medications. Pt was encourage to attend groups. Q 15 minute checks were done for safety.   R:Pt attends groups and interacts well with peers and staff. Pt is taking medication. Pt has no complaints at this time .Pt receptive to treatment and safety maintained on unit.

## 2016-04-11 NOTE — Progress Notes (Signed)
DAR NOTE: Pt present with flat affect and depressed mood in the unit. Pt has been isolating himself and has been bed most of the time. Pt denies physical pain, took all his meds as scheduled. As per self inventory, pt had a good night sleep, good appetite, normal energy, and good concentration. Pt rate depression at 0, hopeless ness at 0, and anxiety at 0. Pt's safety ensured with 15 minute and environmental checks. Pt currently denies SI/HI and A/V hallucinations. Pt verbally agrees to seek staff if SI/HI or A/VH occurs and to consult with staff before acting on these thoughts. Will continue POC.

## 2016-04-11 NOTE — [facility] Group Notes (Signed)
[facility] LCSW Group Therapy  04/11/2016 1:20 PM  Type of Therapy:  Group Therapy  Participation Level:  Active  Participation Quality:  Attentive  Affect:  Appropriate  Cognitive:  Alert and Oriented  Insight:  Engaged  Engagement in Therapy:  Improving  Modes of Intervention:  Discussion, Education, Exploration, Problem-solving, Socialization and Support  Summary of Progress/Problems: Today's Topic: Overcoming Obstacles. Patients identified one short term goal and potential obstacles in reaching this goal. Patients processed barriers involved in overcoming these obstacles. Patients identified steps necessary for overcoming these obstacles and explored motivation (internal and external) for facing these difficulties head on.   Ionna Avis N Smart LCSW 04/11/2016, 1:20 PM

## 2016-04-11 NOTE — Plan of Care (Signed)
Problem: Activity: Goal: Interest or engagement in leisure activities will improve Outcome: Progressing Pt has been attending groups on the unit this weekend
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
## 2016-04-11 NOTE — Tx Team (Signed)
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 acetaminophen (TYLENOL) tablet 650 mg  650 mg Oral Q6H PRN Rozetta Nunnery, NP      . alum & mag hydroxide-simeth (MAALOX/MYLANTA) 200-200-20 MG/5ML suspension 30 mL  30 mL Oral Q4H PRN Rozetta Nunnery, NP      . chlordiazePOXIDE (LIBRIUM) capsule 25 mg  25 mg Oral BID Artist Beach, MD   25 mg at 04/11/16 0813  . feeding supplement (ENSURE ENLIVE) (ENSURE ENLIVE) liquid 237 mL  237 mL Oral BID BM Myer Peer Cobos, MD   237 mL at 04/11/16 0814  . hydrOXYzine (ATARAX/VISTARIL) tablet 25 mg  25 mg Oral TID PRN Rozetta Nunnery, NP   25 mg at 04/10/16 2113  . loperamide (IMODIUM) capsule 2-4 mg  2-4 mg Oral PRN Derrill Center, NP      . magnesium hydroxide (MILK OF MAGNESIA) suspension 30 mL  30 mL Oral Daily PRN Rozetta Nunnery, NP      . mirtazapine (REMERON) tablet 15 mg  15 mg Oral QHS Artist Beach, MD   15 mg at 04/10/16 2113  . multivitamin with minerals tablet 1 tablet  1 tablet Oral Daily Derrill Center, NP   1 tablet at 04/11/16 0813  . ondansetron (ZOFRAN-ODT) disintegrating tablet 4 mg  4 mg Oral Q6H PRN Derrill Center, NP      . thiamine (B-1) injection 100 mg  100 mg Intramuscular Once Derrill Center, NP      . thiamine (VITAMIN B-1) tablet 100 mg  100 mg Oral Daily Encarnacion Slates, NP   100 mg at 04/11/16 0813  . traZODone (DESYREL) tablet 50 mg  50 mg Oral QHS PRN Rozetta Nunnery, NP   50 mg at 04/09/16 0145   PTA Medications: Prescriptions Prior to Admission  Medication Sig Dispense Refill Last Dose  . traZODone (DESYREL) 100 MG tablet Take 100 mg by mouth at bedtime as needed for sleep.   Past Month at  Unknown time  . ranitidine (ZANTAC) 150 MG tablet Take 150 mg by mouth daily as needed for heartburn.   10/02/2015 at Unknown time    Patient Stressors: Financial difficulties Marital or family conflict Occupational concerns Substance abuse  Patient Strengths: Capable of independent living Curator fund of knowledge Motivation for treatment/growth Physical Health  Treatment Modalities: Medication Management, Group therapy, Case management,  1 to 1 session with clinician, Psychoeducation, Recreational therapy.   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[place]) Long Term Goal(s): Improvement in symptoms so as ready for discharge Improvement in symptoms so as ready for discharge   Short Term Goals: Ability to identify changes in lifestyle to reduce recurrence of condition will improve Ability to disclose and discuss suicidal ideas Ability to demonstrate self-control will improve Compliance with prescribed medications will improve Ability to verbalize feelings will improve Ability to identify and develop effective coping behaviors will improve Compliance with prescribed medications will improve Ability to identify triggers associated with substance abuse/mental health issues will improve  Medication Management: Evaluate patient's response, side effects, and tolerance of medication regimen.  Therapeutic Interventions: 1 to 1 sessions, Unit Group sessions and Medication administration.  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[place])  Long Term Goal(s): Improvement in symptoms so as ready for discharge Improvement in symptoms so as ready for discharge   Short Term Goals: Ability to identify changes in lifestyle to reduce recurrence of condition will improve Ability to disclose and discuss suicidal ideas Ability to demonstrate self-control  will improve Compliance with prescribed medications will improve Ability to verbalize feelings will improve Ability to identify and develop effective coping behaviors will improve Compliance with prescribed medications will improve Ability to identify triggers associated with substance abuse/mental health issues will improve     Medication Management: Evaluate patient's response, side effects, and tolerance of medication regimen.  Therapeutic Interventions: 1 to 1 sessions, Unit Group sessions and Medication administration.  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 RN will report any adverse and/or side effects to prescribing provider.  Therapeutic Interventions: 1 on 1 counseling sessions, Psychoeducation, Medication administration, Evaluate responses to treatment, Monitor vital signs and CBGs as ordered, Perform/monitor CIWA, COWS, AIMS and Fall Risk screenings as ordered, Perform wound care treatments as ordered.  Evaluation of Outcomes: Progressing   LCSW Treatment Plan for Primary Diagnosis: Severe major depression ([place]) Long Term Goal(s): Safe transition to appropriate next level of care at discharge, Engage patient in therapeutic group addressing interpersonal concerns.  Short Term Goals: Engage patient in aftercare planning with referrals and resources, Facilitate patient progression through stages of change regarding substance use diagnoses and  concerns and Identify triggers associated with mental health/substance abuse issues  Therapeutic Interventions: Assess for all discharge needs, 1 to 1 time with Social worker, Explore available resources and support systems, Assess for adequacy in community support network, Educate family and significant other(s) on suicide prevention, Complete Psychosocial Assessment, Interpersonal group therapy.  Evaluation of Outcomes: Progressing   Progress in Treatment: Attending groups: Yes. Participating in groups: Yes. Taking medication as prescribed: Yes. Toleration medication: Yes. Family/Significant other contact made: Yes, pt declined to consent to family contact. SPE completed with brother.  Patient understands diagnosis: Yes. Discussing patient identified problems/goals with staff: Yes. Medical problems stabilized or resolved: Yes. Denies suicidal/homicidal ideation: No. Issues/concerns per patient self-inventory: Yes. Other: n/a  New problem(s) identified: No, Describe:  n/a  New Short Term/Long Term Goal(s): detox; medication stabilization; development of comprehensive mental wellness/sobriety plan.   Discharge Plan or Barriers: Pt plans to return to [facility] and will likely go to [facility] for follow-up. CSW assessing for additional referrals.   Reason for Continuation of Hospitalization: Depression Medication stabilization Withdrawal symptoms  Estimated Length of Stay: 1-3 days   Attendees: Patient: 04/11/2016 8:27 AM  Physician: Dr. Sanjuana Letters MD 04/11/2016 8:27 AM  Nursing: Minta Balsam RN 04/11/2016 8:27 AM  RN Care Manager: Lars Pinks CM 04/11/2016 8:27 AM  Social Worker: Nira Conn Smart LCSW; Adriana Reams LCSW 04/11/2016 8:27 AM  Recreational Therapist: Rhunette Croft 04/11/2016 8:27 AM  Other: Ricky Ala NP; May Augustin NP 04/11/2016 8:27 AM  Other:  04/11/2016 8:27 AM  Other: 04/11/2016 8:27 AM    Scribe for Treatment Team: Kimber Relic Smart, LCSW 04/11/2016 8:27 AM

## 2016-04-11 NOTE — Progress Notes (Signed)
Recreation Therapy Notes  Date: 04/11/16 Time: 0930 Location: [address] Dayroom  Group Topic: Stress Management  Goal Area(s) Addresses:  Patient will verbalize importance of using healthy stress management.  Patient will identify positive emotions associated with healthy stress management.   Behavioral Response: Engaged  Intervention: Stress Management  Activity :  Meditation.  LRT introduced the stress management technique of meditation.  LRT played a meditation from the Calm app to give patients the opportunity to engage in the activity.  Patients were to follow along as LRT played the meditation.  Education:  Stress Management, Discharge Planning.   Education Outcome: Acknowledges edcuation/In group clarification offered/Needs additional education  Clinical Observations/Feedback: Pt attended group.    Victorino Sparrow, LRT/CTRS        Ria Comment, Latonda Larrivee A 04/11/2016 3:12 PM

## 2016-04-11 NOTE — Progress Notes (Signed)
[facility] MD Progress Note  04/11/2016 1:47 PM Samuel [name]  MRN:  [id number] Subjective:   36 yo Caucasian male, married, lives with his family. Background history of Alcohol use disorder and MDD. Patient presented to the ER via the police. His wife called after they had an argument. His wife reports that patient was expressing thoughts of suicide. Patient has not been eating and has lost significant weight. He was depressed at presentation.  Seen today. Says he feels better. Has noticed at his depression is lifting. He has been sleeping well. He has normal appetite and his energy is better. Patient states that he was never suicidal. Says it was taken out of context during an argument with his wife. Patient states that he knows they have practical issues they would have to iron out. Says they talked about it yesterday. He is considering moving to [place] temporarily. He has come off alcohol completely. No residual withdrawal symptoms. No evidence of mania. No evidence of psychosis. No thoughts of violence.   Nursing staff reports that he has been more interactive. He has not voiced any futility thoughts. No behavioral issues on the unit. He has been tolerating his medications well.   Patient was discussed at team today. SW would gather collateral from his wife.   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 Malnutrition of moderate degree ([place]) [E44.0] 09/17/2014  . Alcohol withdrawal ([place]) [F10.239] 09/16/2014  . ETOH abuse [F10.10]   . Depression [F32.9]   . PTSD (post-traumatic stress disorder) [F43.10]   . Intractable nausea and vomiting [R11.2] 04/08/2014  . Transaminitis [R74.0] 08/26/2013  . Hypoglycemia [E16.2] 08/26/2013  . 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 Leukocytosis [D72.829] 05/28/2012  . 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 GERD (gastroesophageal reflux disease) [K21.9] 05/13/2011  . Alcohol abuse [F10.10] 05/13/2011  . Fatty liver [K76.0] 05/13/2011   Total Time spent with patient: 20 minutes  Past Psychiatric History: See H&P  Past Medical History:  Past Medical History:  Diagnosis Date  . Alcohol abuse   . Diverticulitis of colon with bleeding 2010   Hospitalized in 2010 for "intestinal infection requiring IV antibiotics"  . Dysphagia   . Elevated LFTs   . GERD (gastroesophageal reflux disease)   . H/O hiatal hernia    pt states he had an EGD at age 34 and had a hiatal hernia  . Hypoglycemia    recurrent  . PTSD (post-traumatic stress disorder)     Past Surgical History:  Procedure Laterality Date  . COLONOSCOPY  05/15/2011   Procedure: COLONOSCOPY;  Surgeon: Irene Shipper, MD;  Location: [facility] ENDOSCOPY;  Service: Endoscopy;  Laterality: N/A;  . ESOPHAGOGASTRODUODENOSCOPY     age 42  . ESOPHAGOGASTRODUODENOSCOPY  05/15/2011   Procedure: ESOPHAGOGASTRODUODENOSCOPY (EGD);  Surgeon: Irene Shipper, MD;  Location: [facility] ENDOSCOPY;  Service: Endoscopy;  Laterality: N/A;  . GIVENS CAPSULE STUDY  05/30/2011   Procedure: GIVENS CAPSULE STUDY;  Surgeon: Irene Shipper, MD;  Location: [place];  Service: Endoscopy;  Laterality: N/A;  Givens Capsule study.  . TONSILLECTOMY     Family History:  Family History  Problem Relation Age of Onset  . Diabetes type II Mother   . Emphysema Father   . Heart attack Father   . Stroke Father   . Diabetes type  II Other   . Diabetes type II Maternal Aunt    Family Psychiatric  History: See H&P Social History:  History  Alcohol Use  . 1.8 oz/week  . 3 Cans of beer per week    Comment: average six 12oz beers/day, ranges from 3-12 12oz/day     History  Drug Use No    Social History   Social History  . Marital status: Single    Spouse name: Di Kindle  . Number of children: 4  . Years of education: N/A   Occupational  History  . Currently unemployed on unemployment   . 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 Smoking status: Never Smoker  . Smokeless tobacco: Current User    Types: Chew     Comment: States he has been chewing tobacco almost daily since age 56  . Alcohol use 1.8 oz/week    3 Cans of beer per week     Comment: average six 12oz beers/day, ranges from 3-12 12oz/day  . Drug use: No  . Sexual activity: Yes    Partners: Female   Other Topics Concern  . None   Social History Narrative   Living in [place] since September 2012.   Lives with girlfriend and 4 children (unsure if all are his) at home.   Drinks well water at home.         Additional Social History:    Pain Medications: None Prescriptions: No home medications Over the Counter: Zantac History of alcohol / drug use?: Yes Longest period of sobriety (when/how long): One year Negative Consequences of Use: Personal relationships Withdrawal Symptoms:  (Denies current withdrawal symptoms) Name of Substance 1: Alcohol 1 - Age of First Use: Adolescent 1 - Amount (size/oz): 6 beers 1 - Frequency: for the past four days 1 - Duration: 4 days 1 - Last Use / Amount: 6 beers 04/07/16                  Sleep: Good  Appetite:  Good  Current Medications: Current Facility-Administered Medications  Medication Dose Route Frequency Provider Last Rate Last Dose  . acetaminophen (TYLENOL) tablet 650 mg  650 mg Oral Q6H PRN Rozetta Nunnery, NP      . alum & mag hydroxide-simeth (MAALOX/MYLANTA) 200-200-20 MG/5ML suspension 30 mL  30 mL Oral Q4H PRN Rozetta Nunnery, NP      . chlordiazePOXIDE (LIBRIUM) capsule 25 mg  25 mg Oral BID Artist Beach, MD   25 mg at 04/11/16 0813  . feeding supplement (ENSURE ENLIVE) (ENSURE ENLIVE) liquid 237 mL  237 mL Oral BID BM Myer Peer Cobos, MD   237 mL at 04/11/16 0814  . hydrOXYzine (ATARAX/VISTARIL) tablet 25 mg  25 mg Oral TID PRN Rozetta Nunnery, NP   25  mg at 04/10/16 2113  . loperamide (IMODIUM) capsule 2-4 mg  2-4 mg Oral PRN Derrill Center, NP      . magnesium hydroxide (MILK OF MAGNESIA) suspension 30 mL  30 mL Oral Daily PRN Rozetta Nunnery, NP      . mirtazapine (REMERON) tablet 15 mg  15 mg Oral QHS Artist Beach, MD   15 mg at 04/10/16 2113  . multivitamin with minerals tablet 1 tablet  1 tablet Oral Daily Derrill Center, NP   1 tablet at 04/11/16 0813  . ondansetron (ZOFRAN-ODT) disintegrating tablet 4 mg  4 mg Oral Q6H  PRN Derrill Center, NP      . thiamine (B-1) injection 100 mg  100 mg Intramuscular Once Derrill Center, NP      . thiamine (VITAMIN B-1) tablet 100 mg  100 mg Oral Daily Encarnacion Slates, NP   100 mg at 04/11/16 0813  . traZODone (DESYREL) tablet 50 mg  50 mg Oral QHS PRN Rozetta Nunnery, NP   50 mg at 04/09/16 0145    Lab Results:  Results for orders placed or performed during the hospital encounter of 04/09/16 (from the past 48 hour(s))  Hepatic function panel     Status: Abnormal   Collection Time: 04/09/16  6:25 PM  Result Value Ref Range   Total Protein 7.2 6.5 - 8.1 g/dL   Albumin 4.6 3.5 - 5.0 g/dL   AST 44 (H) 15 - 41 U/L   ALT 22 17 - 63 U/L   Alkaline Phosphatase 74 38 - 126 U/L   Total Bilirubin 0.7 0.3 - 1.2 mg/dL   Bilirubin, Direct 0.3 0.1 - 0.5 mg/dL   Indirect Bilirubin 0.4 0.3 - 0.9 mg/dL    Comment: Performed at [facility], [place] [address] [place], [place] [address]  CBC with Differential/Platelet     Status: None   Collection Time: 04/09/16  6:25 PM  Result Value Ref Range   WBC 6.4 4.0 - 10.5 K/uL   RBC 4.66 4.22 - 5.81 MIL/uL   Hemoglobin 14.2 13.0 - 17.0 g/dL   HCT 40.7 39.0 - 52.0 %   MCV 87.3 78.0 - 100.0 fL   MCH 30.5 26.0 - 34.0 pg   MCHC 34.9 30.0 - 36.0 g/dL   RDW 13.0 11.5 - 15.5 %   Platelets 236 150 - 400 K/uL   Neutrophils Relative % 75 %   Neutro Abs 4.8 1.7 - 7.7 K/uL   Lymphocytes Relative 19 %   Lymphs Abs 1.2 0.7 - 4.0 K/uL   Monocytes Relative  6 %   Monocytes Absolute 0.4 0.1 - 1.0 K/uL   Eosinophils Relative 0 %   Eosinophils Absolute 0.0 0.0 - 0.7 K/uL   Basophils Relative 0 %   Basophils Absolute 0.0 0.0 - 0.1 K/uL    Comment: Performed at [facility], [place] [address], [place], [place] [address]  Ethanol     Status: None   Collection Time: 04/09/16  6:25 PM  Result Value Ref Range   Alcohol, Ethyl (B) <5 <5 mg/dL    Comment:        LOWEST DETECTABLE LIMIT FOR SERUM ALCOHOL IS 5 mg/dL FOR MEDICAL PURPOSES ONLY Performed at [place] [address], [place], [place] [address]     Blood Alcohol level:  Lab Results  Component Value Date   ETH <5 04/09/2016   ETH <5 [id number]    Metabolic Disorder Labs: Lab Results  Component Value Date   HGBA1C 5.5 04/23/2014   MPG 111 04/23/2014   MPG 105 04/08/2014   No results found for: PROLACTIN No results found for: CHOL, TRIG, HDL, CHOLHDL, VLDL, LDLCALC  Physical Findings: AIMS: Facial and Oral Movements Muscles of Facial Expression: None, normal Lips and Perioral Area: None, normal Jaw: None, normal Tongue: None, normal,Extremity Movements Upper (arms, wrists, hands, fingers): None, normal Lower (legs, knees, ankles, toes): None, normal, Trunk Movements Neck, shoulders, hips: None, normal, Overall Severity Severity of abnormal movements (highest score from questions above): None, normal Incapacitation due to abnormal movements: None,  normal Patient's awareness of abnormal movements (rate only patient's report): No Awareness, Dental Status Current problems with teeth and/or dentures?: No Does patient usually wear dentures?: No  CIWA:  CIWA-Ar Total: 0 COWS:     Musculoskeletal: Strength & Muscle Tone: within normal limits Gait & Station: normal Patient leans: N/A  Psychiatric Specialty Exam: Physical Exam  Constitutional: He is oriented to person, place, and time. He appears well-developed and well-nourished.   HENT:  Head: Normocephalic and atraumatic.  Eyes: Conjunctivae and EOM are normal. Pupils are equal, round, and reactive to light.  Neck: Normal range of motion. Neck supple.  Cardiovascular: Normal rate and regular rhythm.   Respiratory: Effort normal and breath sounds normal.  GI: Soft. Bowel sounds are normal.  Musculoskeletal: Normal range of motion.  Neurological: He is alert and oriented to person, place, and time. He has normal reflexes.  Skin: Skin is warm and dry.  Psychiatric:  As above    ROS  Blood pressure 120/74, pulse 73, temperature 98.2 F (36.8 C), resp. rate 18, height 5\' 7"  (1.702 m), weight 54 kg (119 lb), SpO2 100 %.Body mass index is 18.64 kg/m.  General Appearance: More interactive today. Normal psychomotor activity. Good relatedness and appropriate. No evidence of withdrawals.   Eye Contact:  Good  Speech:  Clear and Coherent and Normal Rate  Volume:  Normal  Mood:  Euthymic  Affect:  Full range and appropriate  Thought Process:  Linear and goal directed  Orientation:  Full (Time, Place, and Person)  Thought Content:  No negative rumination, no thoughts of violence. No perceptual abnormality. Focused on his future  Suicidal Thoughts:  No  Homicidal Thoughts:  No  Memory:  WNL  Judgement:  Better  Insight:  Good  Psychomotor Activity:  Better  Concentration:  Concentration: Good  Recall:  Good  Fund of Knowledge:  Good  Language:  Good  Akathisia:  No  Handed:    AIMS (if indicated):     Assets:  Desire for Improvement Housing Intimacy Physical Health Resilience  ADL's:  Better  Cognition:  WNL  Sleep:  Number of Hours: 6     Treatment Plan Summary: Patient is feeling better. He is no longer in withdrawals. He is not psychotic and depression is lifting.  Plan:   1. Increase Mirtazapine to 30 mg HS 2. Continue to monitor mood, behavior and interaction with peers 3. Collateral from his wife 4. Possible discharge tomorrow if collateral  does not indicate unattended risk issues.   Artist Beach, MD 04/11/2016, 1:47 PMPatient ID: Samuel [name], male   DOB: Oct 18, 1980, 36 y.o.   MRN: [id number]

## 2016-04-11 NOTE — [facility] Group Notes (Signed)
Pt attended spiritual care group on grief and loss facilitated by chaplain Jerene Pitch   Group opened with brief discussion and psycho-social ed around grief and loss in relationships and in relation to self - identifying life patterns, circumstances, changes that cause losses. Established group norm of speaking from own life experience. Group goal of establishing open and affirming space for members to share loss and experience with grief, normalize grief experience and provide psycho social education and grief support.            Samuel [name], [name]

## 2016-04-12 DIAGNOSIS — Z9889 Other specified postprocedural states: Secondary | ICD-10-CM

## 2016-04-12 DIAGNOSIS — Z823 Family history of stroke: Secondary | ICD-10-CM

## 2016-04-12 DIAGNOSIS — F322 Major depressive disorder, single episode, severe without psychotic features: Principal | ICD-10-CM

## 2016-04-12 DIAGNOSIS — F418 Other specified anxiety disorders: Secondary | ICD-10-CM

## 2016-04-12 DIAGNOSIS — Z833 Family history of diabetes mellitus: Secondary | ICD-10-CM

## 2016-04-12 DIAGNOSIS — Z79899 Other long term (current) drug therapy: Secondary | ICD-10-CM

## 2016-04-12 DIAGNOSIS — Z8249 Family history of ischemic heart disease and other diseases of the circulatory system: Secondary | ICD-10-CM

## 2016-04-12 NOTE — Progress Notes (Signed)
CSW and Dr. Shea Evans MD met with pt and asked that he stay until tomorrow to take advantage of various groups and speakers coming today that he would benefit from. Patient agreeable to discharging tomorrow with wife. With pt consent, CSW contacted pt's wife. She agreed to pick him up tomorrow afternoon and plans to fax over contract for pt to return home and review.   Maxie Better, MSW, LCSW Clinical Social Worker 04/12/2016 12:13 PM

## 2016-04-12 NOTE — Progress Notes (Signed)
CSW met with pt individually this afternoon to give him contract that his wife drew up. Pt was asked to read and think about stipulations on this contract in order for him to return home and contact his wife to discuss. Pt aware that his wife is planning to pick him up tomorrow (WED) afternoon.   Maxie Better, MSW, LCSW Clinical Social Worker 04/12/2016 4:33 PM

## 2016-04-12 NOTE — Plan of Care (Signed)
Problem: Coping: Goal: Ability to cope will improve Outcome: Progressing Pt denies SI at this time   

## 2016-04-12 NOTE — Progress Notes (Signed)
Pt wife needs to be contacted to find out what time she can pick him up.

## 2016-04-12 NOTE — Progress Notes (Signed)
D: Pt denies SI/HI/AVH. Pt is pleasant and cooperative. Pt stated he plans to go home with his wife they plan to try to work it out.   A: Pt was offered support and encouragement. Pt was given scheduled medications. Pt was encourage to attend groups. Q 15 minute checks were done for safety.   R:Pt attends groups and interacts well with peers and staff. Pt is taking medication. Pt has no complaints at this time .Pt receptive to treatment and safety maintained on unit.

## 2016-04-12 NOTE — Progress Notes (Signed)
D: Pt continues to be very flat and depressed on the unit today. Pt also continues to be very isolative. Pt reported this morning that he was ready for discharge, and that he just wanted to go home. Pt reported that his depression was a 0, his hopelessness was a 0, and that his anxiety was a 0. Pt reported being negative SI/HI, no AH/VH noted. A: 15 min checks continued for patient safety. R: Pts safety maintained.

## 2016-04-12 NOTE — Progress Notes (Signed)
Recreation Therapy Notes  Animal-Assisted Activity (AAA) Program Checklist/Progress Notes Patient Eligibility Criteria Checklist & Daily Group note for Rec TxIntervention  Date: 02.06.2018 Time: 2:45pm Location: [address]    AAA/T Program Assumption of Risk Form signed by Patient/ or Parent Legal Guardian Yes  Patient is free of allergies or sever asthma Yes  Patient reports no fear of animals Yes  Patient reports no history of cruelty to animals Yes  Patient understands his/her participation is voluntary Yes  Patient washes hands before animal contact Yes  Patient washes hands after animal contact Yes  Behavioral Response: Did not attend.   Laureen Ochs Katelyne Galster, LRT/CTRS        Geno Sydnor L 04/12/2016 3:07 PM

## 2016-04-12 NOTE — Progress Notes (Signed)
Pt attend group. His goal was a 5. His goal was to leave to go home. He did not leave. He talk to the doctor.

## 2016-04-12 NOTE — Progress Notes (Signed)
[facility] MD Progress Note  04/12/2016 12:43 PM Samuel [name]  MRN:  [id number]  Subjective:  Samuel [name] reports, "I have been in this hospital for about 3 days now. My wife & I got into an argument. She has Bipolar disorder & her mood flares up on her. Out of one those mood flare-up, she called the cops & told them that I was suicidal. I was not suicidal then & I'm not suicidal now. I was depressed, but my depression is better. I need to be discharged today to go home, get my personal belongings & find another place to live. My room-mate has also promised me to assist me in finding a place to live after discharge". Patient's request was made aware with attending physician.  36 yo Caucasian male, married, lives with his family. Background history of Alcohol use disorder and MDD. Patient presented to the ER via the police. His wife called after they had an argument. His wife reports that patient was expressing thoughts of suicide. Patient has not been eating and has lost significant weight. He was depressed at presentation. Seen today. Says he feels better. Has noticed at his depression is lifting. He has been sleeping well. He has normal appetite and his energy is better. Patient states that he was never suicidal. Says it was taken out of context during an argument with his wife. Patient states that he knows they have practical issues they would have to iron out. Says they talked about it yesterday. He is considering moving to [place] temporarily. He has come off alcohol completely. No residual withdrawal symptoms. No evidence of mania. No evidence of psychosis. No thoughts of violence.   Nursing staff reports that he has been more interactive. He has not voiced any futility thoughts. No behavioral issues on the unit. He has been tolerating his medications well.   Patient was discussed at team today. SW & the Md met with Pilar Plate. An agreement was reached that Samuel [name] will call & talk with his wife. And if their discussion is  amicable & she agrees to pick patient up tomorrow, he will be then discharge..   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 Malnutrition of moderate degree ([place]) [E44.0] 09/17/2014  . Alcohol withdrawal ([place]) [F10.239] 09/16/2014  . ETOH abuse [F10.10]   . Depression [F32.9]   . PTSD (post-traumatic stress disorder) [F43.10]   . Intractable nausea and vomiting [R11.2] 04/08/2014  . Transaminitis [R74.0] 08/26/2013  . Hypoglycemia [E16.2] 08/26/2013  . 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 Leukocytosis [D72.829] 05/28/2012  . 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 GERD (gastroesophageal reflux disease) [K21.9] 05/13/2011  . Alcohol abuse [F10.10] 05/13/2011  . Fatty liver [K76.0] 05/13/2011   Total Time spent with patient: 15 minutes  Past Psychiatric History: See H&P  Past Medical History:  Past Medical History:  Diagnosis Date  . Alcohol abuse   . Diverticulitis of colon with bleeding 2010   Hospitalized in 2010 for "intestinal infection requiring IV antibiotics"  . Dysphagia   . Elevated LFTs   . GERD (gastroesophageal reflux disease)   . H/O hiatal hernia    pt states he had an EGD at age 48 and had a hiatal hernia  . Hypoglycemia    recurrent  . PTSD (post-traumatic stress disorder)     Past Surgical  History:  Procedure Laterality Date  . COLONOSCOPY  05/15/2011   Procedure: COLONOSCOPY;  Surgeon: Irene Shipper, MD;  Location[facility] ENDOSCOPY;  Service: Endoscopy;  Laterality: N/A;  . ESOPHAGOGASTRODUODENOSCOPY     age 71  . ESOPHAGOGASTRODUODENOSCOPY  05/15/2011   Procedure: ESOPHAGOGASTRODUODENOSCOPY (EGD);  Surgeon: Irene Shipper, MD;  Location: [facility] ENDOSCOPY;  Service: Endoscopy;  Laterality: N/A;  . GIVENS CAPSULE STUDY  05/30/2011   Procedure: GIVENS CAPSULE STUDY;  Surgeon:  Irene Shipper, MD;  Location: [place];  Service: Endoscopy;  Laterality: N/A;  Givens Capsule study.  . TONSILLECTOMY     Family History:  Family History  Problem Relation Age of Onset  . Diabetes type II Mother   . Emphysema Father   . Heart attack Father   . Stroke Father   . Diabetes type II Other   . Diabetes type II Maternal Aunt    Family Psychiatric  History: See H&P Social History:  History  Alcohol Use  . 1.8 oz/week  . 3 Cans of beer per week    Comment: average six 12oz beers/day, ranges from 3-12 12oz/day     History  Drug Use No    Social History   Social History  . Marital status: Single    Spouse name: Di Kindle  . Number of children: 4  . Years of education: N/A   Occupational History  . Currently unemployed on unemployment   . 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 Smoking status: Never Smoker  . Smokeless tobacco: Current User    Types: Chew     Comment: States he has been chewing tobacco almost daily since age 56  . Alcohol use 1.8 oz/week    3 Cans of beer per week     Comment: average six 12oz beers/day, ranges from 3-12 12oz/day  . Drug use: No  . Sexual activity: Yes    Partners: Female   Other Topics Concern  . None   Social History Narrative   Living in [place] since September 2012.   Lives with girlfriend and 4 children (unsure if all are his) at home.   Drinks well water at home.         Additional Social History:    Pain Medications: None Prescriptions: No home medications Over the Counter: Zantac History of alcohol / drug use?: Yes Longest period of sobriety (when/how long): One year Negative Consequences of Use: Personal relationships Withdrawal Symptoms:  (Denies current withdrawal symptoms) Name of Substance 1: Alcohol 1 - Age of First Use: Adolescent 1 - Amount (size/oz): 6 beers 1 - Frequency: for the past four days 1 - Duration: 4 days 1 - Last Use / Amount: 6 beers  04/07/16  Sleep: Good  Appetite:  Good  Current Medications: Current Facility-Administered Medications  Medication Dose Route Frequency Provider Last Rate Last Dose  . acetaminophen (TYLENOL) tablet 650 mg  650 mg Oral Q6H PRN Rozetta Nunnery, NP      . alum & mag hydroxide-simeth (MAALOX/MYLANTA) 200-200-20 MG/5ML suspension 30 mL  30 mL Oral Q4H PRN Rozetta Nunnery, NP      . feeding supplement (ENSURE ENLIVE) (ENSURE ENLIVE) liquid 237 mL  237 mL Oral BID BM Myer Peer Cobos, MD   237 mL at 04/11/16 1437  . hydrOXYzine (ATARAX/VISTARIL) tablet 25 mg  25 mg Oral TID PRN Rozetta Nunnery, NP   25 mg at 04/11/16 2151  .  magnesium hydroxide (MILK OF MAGNESIA) suspension 30 mL  30 mL Oral Daily PRN Rozetta Nunnery, NP      . mirtazapine (REMERON) tablet 30 mg  30 mg Oral QHS Artist Beach, MD   30 mg at 04/11/16 2151  . multivitamin with minerals tablet 1 tablet  1 tablet Oral Daily Derrill Center, NP   1 tablet at 04/12/16 0815  . thiamine (B-1) injection 100 mg  100 mg Intramuscular Once Derrill Center, NP      . thiamine (VITAMIN B-1) tablet 100 mg  100 mg Oral Daily Encarnacion Slates, NP   100 mg at 04/12/16 0815  . traZODone (DESYREL) tablet 50 mg  50 mg Oral QHS PRN Rozetta Nunnery, NP   50 mg at 04/09/16 0145    Lab Results:  No results found for this or any previous visit (from the past 48 hour(s)).  Blood Alcohol level:  Lab Results  Component Value Date   ETH <5 04/09/2016   ETH <5 39/05/90   Metabolic Disorder Labs: Lab Results  Component Value Date   HGBA1C 5.5 04/23/2014   MPG 111 04/23/2014   MPG 105 04/08/2014   No results found for: PROLACTIN No results found for: CHOL, TRIG, HDL, CHOLHDL, VLDL, LDLCALC  Physical Findings: AIMS: Facial and Oral Movements Muscles of Facial Expression: None, normal Lips and Perioral Area: None, normal Jaw: None, normal Tongue: None, normal,Extremity Movements Upper (arms, wrists, hands, fingers): None, normal Lower (legs, knees,  ankles, toes): None, normal, Trunk Movements Neck, shoulders, hips: None, normal, Overall Severity Severity of abnormal movements (highest score from questions above): None, normal Incapacitation due to abnormal movements: None, normal Patient's awareness of abnormal movements (rate only patient's report): No Awareness, Dental Status Current problems with teeth and/or dentures?: No Does patient usually wear dentures?: No  CIWA:  CIWA-Ar Total: 2 COWS:     Musculoskeletal: Strength & Muscle Tone: within normal limits Gait & Station: normal Patient leans: N/A  Psychiatric Specialty Exam: Physical Exam  Constitutional: He is oriented to person, place, and time. He appears well-developed and well-nourished.  HENT:  Head: Normocephalic and atraumatic.  Eyes: Conjunctivae and EOM are normal. Pupils are equal, round, and reactive to light.  Neck: Normal range of motion. Neck supple.  Cardiovascular: Normal rate and regular rhythm.   Respiratory: Effort normal and breath sounds normal.  GI: Soft. Bowel sounds are normal.  Musculoskeletal: Normal range of motion.  Neurological: He is alert and oriented to person, place, and time. He has normal reflexes.  Skin: Skin is warm and dry.  Psychiatric:  As above    ROS  Blood pressure 116/79, pulse 75, temperature 97.8 F (36.6 C), temperature source Oral, resp. rate 18, height 5' 7"  (1.702 m), weight 54 kg (119 lb), SpO2 100 %.Body mass index is 18.64 kg/m.  General Appearance: More interactive today. Normal psychomotor activity. Good relatedness and appropriate. No evidence of withdrawals.   Eye Contact:  Good  Speech:  Clear and Coherent and Normal Rate  Volume:  Normal  Mood:  Euthymic  Affect:  Full range and appropriate  Thought Process:  Linear and goal directed  Orientation:  Full (Time, Place, and Person)  Thought Content:  No negative rumination, no thoughts of violence. No perceptual abnormality. Focused on his future  Suicidal  Thoughts:  No  Homicidal Thoughts:  No  Memory:  WNL  Judgement:  Better  Insight:  Good  Psychomotor Activity:  Better  Concentration:  Concentration: Good  Recall:  Good  Fund of Knowledge:  Good  Language:  Good  Akathisia:  No  Handed:    AIMS (if indicated):     Assets:  Desire for Improvement Housing Intimacy Physical Health Resilience  ADL's:  Better  Cognition:  WNL  Sleep:  Number of Hours: 6.25   Treatment Plan Summary: Patient is feeling better. He is no longer in withdrawals. He is not psychotic and depression is lifting.  Plan:   1. Will continue Mirtazapine 30 mg HS for depression/insomnia. 2. Will continue to monitor mood, behavior and interaction with peers 3. Social worker to obtain collateral from from his wife 4. Possible discharge tomorrow if collateral does not indicate unattended risk issues.   Encarnacion Slates, NP, PMHNP, FNP-BC 04/12/2016, 12:43 PMPatient ID: Samuel [name], male   DOB: 11/10/1980, 36 y.o.   MRN: [id number]

## 2016-04-12 NOTE — [facility] Group Notes (Signed)
[place] LCSW Group Therapy  04/12/2016 4:32 PM  Type of Therapy:  Group Therapy  Participation Level:  Active  Participation Quality:  Attentive  Affect:  Appropriate  Cognitive:  Alert and Oriented  Insight:  Engaged  Engagement in Therapy:  Engaged  Modes of Intervention:  Confrontation, Discussion, Education, Problem-solving, Socialization and Support  Summary of Progress/Problems: [facility] Speaker came to talk about his personal journey with substance abuse and addiction. The pt processed ways by which to relate to the speaker. [place] speaker provided handouts and educational information pertaining to groups and services offered by the [facility].   Bucky Grigg N Smart LCSW 04/12/2016, 4:32 PM

## 2016-04-13 MED ORDER — TRAZODONE HCL 50 MG PO TABS: 50.0000 mg | ORAL_TABLET | Freq: Every evening | ORAL | 0 refills | Status: AC | PRN

## 2016-04-13 MED ORDER — MIRTAZAPINE 30 MG PO TABS: 30.0000 mg | ORAL_TABLET | Freq: Every day | ORAL | 0 refills | Status: AC

## 2016-04-13 MED ORDER — HYDROXYZINE HCL 25 MG PO TABS: 25.0000 mg | ORAL_TABLET | Freq: Three times a day (TID) | ORAL | 0 refills | Status: AC | PRN

## 2016-04-13 NOTE — Progress Notes (Signed)
  [facility] Adult Case Management Discharge Plan :  Will you be returning to the same living situation after discharge:  Yes,  home with wife At discharge, do you have transportation home?: Yes,  wife will pick him up Do you have the ability to pay for your medications: Yes,  mental health  Release of information consent forms completed and submitted to medical records by CSW.  Patient to Follow up at: Follow-up Information    [facility] Follow up on 04/14/2016.   Why:  Appt on this date at 10:30AM for hospital follow-up. Thank you.  Contact information: [place] [id number] [id number]           Next level of care provider has access to [place] and Suicide Prevention discussed: Yes,  SPE completed with pt's wife. SPI pamphlet and Mobile Crisis information provided.  Have you used any form of tobacco in the last 30 days? (Cigarettes, Smokeless Tobacco, Cigars, and/or Pipes): Yes  Has patient been referred to the Quitline?: Patient refused referral  Patient has been referred for addiction treatment: Yes  Delmy Holdren N Smart LCSW 04/13/2016, 9:07 AM

## 2016-04-13 NOTE — Tx Team (Signed)
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 acetaminophen (TYLENOL) tablet 650 mg  650 mg Oral Q6H PRN Rozetta Nunnery, NP      . alum & mag hydroxide-simeth (MAALOX/MYLANTA) 200-200-20 MG/5ML suspension 30 mL  30 mL Oral Q4H PRN Rozetta Nunnery, NP      . feeding supplement (ENSURE ENLIVE) (ENSURE ENLIVE) liquid 237 mL  237 mL Oral BID BM Myer Peer Cobos, MD   237 mL at 04/11/16 1437  . hydrOXYzine (ATARAX/VISTARIL) tablet 25 mg  25 mg Oral TID PRN Rozetta Nunnery, NP   25 mg at 04/12/16 2114  . magnesium hydroxide (MILK OF MAGNESIA) suspension 30 mL  30 mL Oral Daily PRN Rozetta Nunnery, NP      . mirtazapine (REMERON) tablet 30 mg  30 mg Oral QHS Artist Beach, MD   30 mg at 04/12/16 2114  . multivitamin with minerals tablet 1 tablet  1 tablet Oral Daily Derrill Center, NP   1 tablet at 04/13/16 0804  . thiamine (B-1) injection 100 mg  100 mg Intramuscular Once Derrill Center, NP      . thiamine (VITAMIN B-1) tablet 100 mg  100 mg Oral Daily Encarnacion Slates, NP   100 mg at 04/13/16 0804  . traZODone (DESYREL) tablet 50 mg  50 mg Oral QHS PRN Rozetta Nunnery, NP   50 mg at 04/09/16 0145   PTA Medications: Prescriptions Prior to Admission  Medication Sig Dispense Refill Last Dose  . traZODone (DESYREL) 100 MG tablet Take 100 mg by mouth at bedtime as needed for sleep.   Past Month at Unknown time  . ranitidine (ZANTAC) 150 MG tablet Take 150 mg by mouth daily as needed for heartburn.   10/02/2015 at Unknown time    Patient Stressors: Financial difficulties Marital or family conflict Occupational concerns Substance abuse  Patient Strengths: Capable of  independent living Curator fund of knowledge Motivation for treatment/growth Physical Health  Treatment Modalities: Medication Management, Group therapy, Case management,  1 to 1 session with clinician, Psychoeducation, Recreational therapy.   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[place]) Long Term Goal(s): Improvement in symptoms so as ready for discharge Improvement in symptoms so as ready for discharge   Short Term Goals: Ability to identify changes in lifestyle to reduce recurrence of condition will improve Ability to disclose and discuss suicidal ideas Ability to demonstrate self-control will improve Compliance with prescribed medications will improve Ability to verbalize feelings will improve Ability to identify and develop effective coping behaviors will improve Compliance with prescribed medications will improve Ability to identify triggers associated with substance abuse/mental health issues will improve  Medication Management: Evaluate patient's response, side effects, and tolerance of medication regimen.  Therapeutic Interventions: 1 to 1 sessions, Unit Group sessions and Medication administration.  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[place])  Long Term Goal(s): Improvement in symptoms so as ready for discharge Improvement in symptoms so as ready for discharge   Short Term Goals: Ability to identify changes in lifestyle to reduce recurrence of condition will improve Ability to disclose and discuss suicidal ideas Ability  to demonstrate self-control will improve Compliance with prescribed medications will improve Ability to verbalize feelings will improve Ability to identify and develop effective coping behaviors will improve Compliance with prescribed medications will improve Ability to identify triggers associated with  substance abuse/mental health issues will improve     Medication Management: Evaluate patient's response, side effects, and tolerance of medication regimen.  Therapeutic Interventions: 1 to 1 sessions, Unit Group sessions and Medication administration.  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 RN will report any adverse and/or side effects to prescribing provider.  Therapeutic Interventions: 1 on 1 counseling sessions, Psychoeducation, Medication administration, Evaluate responses to treatment, Monitor vital signs and CBGs as ordered, Perform/monitor CIWA, COWS, AIMS and Fall Risk screenings as ordered, Perform wound care treatments as ordered.  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  Short Term Goals: Engage patient in aftercare planning with referrals and resources, Facilitate patient progression through stages of change regarding substance use diagnoses and concerns and Identify triggers associated with mental health/substance abuse issues  Therapeutic Interventions: Assess for all discharge needs, 1 to 1 time with Social worker, Explore available resources and support systems, Assess for adequacy in community support network, Educate family and  significant other(s) on suicide prevention, Complete Psychosocial Assessment, Interpersonal group therapy.  Evaluation of Outcomes: Met   Progress in Treatment: Attending groups: Yes. Participating in groups: Yes. Taking medication as prescribed: Yes. Toleration medication: Yes. Family/Significant other contact made: Yes, pt declined to consent to family contact. SPE completed with wife  Patient understands diagnosis: Yes. Discussing patient identified problems/goals with staff: Yes. Medical problems stabilized or resolved: Yes. Denies suicidal/homicidal ideation: No. Issues/concerns per patient self-inventory: Yes. Other: n/a  New problem(s) identified: No, Describe:  n/a  New Short Term/Long Term Goal(s): detox; medication stabilization; development of comprehensive mental wellness/sobriety plan.   Discharge Plan or Barriers: Pt plans to return to [facility] and will likely go to [facility] for follow-up. CSW assessing for additional referrals.   Reason for Continuation of Hospitalization: none  Estimated Length of Stay: discharge today  Attendees: Patient: 04/13/2016 9:12 AM  Physician: Dr. Daron Offer MD 04/13/2016 9:12 AM  Nursing: Carlynn Purl RN 04/13/2016 9:12 AM  RN Care Manager: Lars Pinks CM 04/13/2016 9:12 AM  Social Worker: Press photographer LCSW;  04/13/2016 9:12 AM  Recreational Therapist: Rhunette Croft 04/13/2016 9:12 AM  Other: Lindell Spar NP; May Augustin NP 04/13/2016 9:12 AM  Other:  04/13/2016 9:12 AM  Other: 04/13/2016 9:12 AM    Scribe for Treatment Team: Kimber Relic Smart, LCSW 04/13/2016 9:12 AM

## 2016-04-13 NOTE — Progress Notes (Signed)
Pt discharged home with his wife and dad. Pt was ambulatory, stable and appreciative at that time. All papers and prescriptions were given and valuables returned. Verbal understanding expressed. Denies SI/HI and A/VH. Pt given opportunity to express concerns and ask questions.
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
## 2016-04-13 NOTE — Discharge Summary (Signed)
Physician Discharge Summary Note  Patient:  Samuel [name] is an 36 y.o., male MRN:  [id number] DOB:  05/15/1980 Patient phone:  There is no home phone number on file.  Patient address:   [place] [address],  Total Time spent with patient: Greater than 30 minutes  Date of Admission:  04/09/2016 Date of Discharge: 04-13-16  Reason for Admission: Worsening symptoms of depression triggering suicidal ideations.  Principal Problem: Severe major depression [facility])  Discharge Diagnoses: Patient Active Problem List   Diagnosis Date Noted  . Severe major depression ([place]) [F32.2] 04/09/2016  . 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 Malnutrition of moderate degree ([place]) [E44.0] 09/17/2014  . Alcohol withdrawal ([place]) [F10.239] 09/16/2014  . ETOH abuse [F10.10]   . Depression [F32.9]   . PTSD (post-traumatic stress disorder) [F43.10]   . Intractable nausea and vomiting [R11.2] 04/08/2014  . Transaminitis [R74.0] 08/26/2013  . Hypoglycemia [E16.2] 08/26/2013  . 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 Leukocytosis [D72.829] 05/28/2012  . 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 GERD (gastroesophageal reflux disease) [K21.9] 05/13/2011  . Alcohol abuse [F10.10] 05/13/2011  . Fatty liver [K76.0] 05/13/2011   Past Psychiatric History: Alcoholism, chronic, MDD  Past Medical History:  Past Medical History:  Diagnosis Date  . Alcohol abuse   . Diverticulitis of colon with bleeding 2010   Hospitalized in 2010 for "intestinal infection requiring IV antibiotics"  . Dysphagia   . Elevated LFTs   . GERD (gastroesophageal reflux disease)   . H/O hiatal hernia    pt states he had an EGD at age 74 and had a hiatal hernia  . Hypoglycemia    recurrent  . PTSD (post-traumatic stress disorder)     Past Surgical History:  Procedure Laterality Date  . COLONOSCOPY  05/15/2011   Procedure: COLONOSCOPY;  Surgeon: Samuel Shipper, MD;  Location[facility] ENDOSCOPY;  Service:  Endoscopy;  Laterality: N/A;  . ESOPHAGOGASTRODUODENOSCOPY     age 43  . ESOPHAGOGASTRODUODENOSCOPY  05/15/2011   Procedure: ESOPHAGOGASTRODUODENOSCOPY (EGD);  Surgeon: Samuel Shipper, MD;  Location: [facility] ENDOSCOPY;  Service: Endoscopy;  Laterality: N/A;  . GIVENS CAPSULE STUDY  05/30/2011   Procedure: GIVENS CAPSULE STUDY;  Surgeon: Samuel Shipper, MD;  Location: [facility];  Service: Endoscopy;  Laterality: N/A;  Givens Capsule study.  . TONSILLECTOMY     Family History:  Family History  Problem Relation Age of Onset  . Diabetes type II Mother   . Emphysema Father   . Heart attack Father   . Stroke Father   . Diabetes type II Other   . Diabetes type II Maternal Aunt    Family Psychiatric  History: See H&P  Social History:  History  Alcohol Use  . 1.8 oz/week  . 3 Cans of beer per week    Comment: average six 12oz beers/day, ranges from 3-12 12oz/day     History  Drug Use No    Social History   Social History  . Marital status: Single    Spouse name: Samuel [name]  . Number of children: 4  . Years of education: N/A   Occupational History  . Currently unemployed on unemployment   . 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 Smoking status: Never Smoker  . Smokeless tobacco: Current User    Types: Chew     Comment: States he has been chewing tobacco almost daily since age  13  . Alcohol use 1.8 oz/week    3 Cans of beer per week     Comment: average six 12oz beers/day, ranges from 3-12 12oz/day  . Drug use: No  . Sexual activity: Yes    Partners: Female   Other Topics Concern  . None   Social History Narrative   Living in [place] since September 2012.   Lives with girlfriend and 4 children (unsure if all are his) at home.   Drinks well water at home.         Hospital Course: [name] a 36 y.o.malethat reports increased depression associated with his wife threatening to divorce him and asking him to leave her home due to his  alcoholism. Patient report that he recently relapse. Patient reports that he last time that he drank was yesterday. Patient denies withdrawal symptoms. Patient reports that he has been clean for one year. Patient denies SI/HI/Psychosis.Patient reports that he was talking about death to his sister but he did not mean to imply that he wanted to die. Patient reports that he has been with his wife for five years and he has been married for two years. Patient reports additional stressors to include not being able to stay employed.Patient reports that his current job is not paying him his correct amoun tot money. Collateral information obtained by the patient wife reports that he has been telling her and his father that he wants to kill himself. Patient is also not taking his psychiatric medication".  [name] was admitted to the hospital for worsening symptoms of depression & crisis management due to suicidal threats. Apparently, he relapsed on alcohol & his wife threatened to divorce him. His UDS report on admission was positive for Benzodiazepine. Although, reported having been drinking alcohol prior to admission, his BAL on admission was <5. He was in need of mood stabilization & benzodiazepine detox. He received Librium capsules 25 mg bid to combat the Benzodiazepine withdrawal symptoms.   After his admission assessment, [name] symptoms were identified. The medication regimen targeting those symptoms were initiated. He was medicated & discharged on; Mirtazapine 30 mg for depression/insomnia, Hydroxyzine 25 mg prn for anxiety & Trazodone 50 mg for insomnia. He presented no other pre-existing medical problems that required treatment. [name] was enrolled & participated in the group counseling sessions being offered & held on this unit, he learned coping skills..  During the course of his hospitalization, [name] was monitored by observation & his daily report of symptom reduction noted. His  emotional & mental status were monitored by daily self-inventory reports completed by him & the clinical staff. He was evaluated by the treatment team for mood stability & plans for continued recovery after discharge. Suheyb's motivation was an integral factor in his mood stability. He was offered further treatment options upon discharge on an outpatient basis as noted below.   Upon discharge, Keagan was both mentally & medically stable. He denies any suicidal/homicidal ideations, auditory/visual/tactile hallucinations, delusional thoughts or paranoia. He received from the pharmacy, a 7 days worth, supply samples of his [facility] discharge medications. He left [facility] with all belongings in no distress. Transportation per wife.  Physical Findings: AIMS: Facial and Oral Movements Muscles of Facial Expression: None, normal Lips and Perioral Area: None, normal Jaw: None, normal Tongue: None, normal,Extremity Movements Upper (arms, wrists, hands, fingers): None, normal Lower (legs, knees, ankles, toes): None, normal, Trunk Movements Neck, shoulders, hips: None, normal, Overall Severity Severity of abnormal movements (highest score from questions above): None,  normal Incapacitation due to abnormal movements: None, normal Patient's awareness of abnormal movements (rate only patient's report): No Awareness, Dental Status Current problems with teeth and/or dentures?: No Does patient usually wear dentures?: No  CIWA:  CIWA-Ar Total: 2 COWS:     Musculoskeletal: Strength & Muscle Tone: within normal limits Gait & Station: normal Patient leans: N/A  Psychiatric Specialty Exam: Physical Exam  Constitutional: He appears well-developed.  HENT:  Head: Normocephalic.  Eyes: Pupils are equal, round, and reactive to light.  Neck: Normal range of motion.  Cardiovascular: Normal rate.   Respiratory: Effort normal.  GI: Soft.  Genitourinary:  Genitourinary Comments: Denies any issues in this area.   Musculoskeletal: Normal range of motion.  Neurological: He is alert.  Skin: Skin is warm.    Review of Systems  Constitutional: Negative.   HENT: Negative.   Eyes: Negative.   Respiratory: Negative.   Cardiovascular: Negative.   Gastrointestinal: Negative.   Genitourinary: Negative.   Musculoskeletal: Negative.   Skin: Negative.   Neurological: Negative.   Endo/Heme/Allergies: Negative.   Psychiatric/Behavioral: Positive for depression (Stable) and substance abuse (Hx. Alcoholism, chronic). Negative for hallucinations, memory loss and suicidal ideas. The patient has insomnia (Stable). The patient is not nervous/anxious.     Blood pressure 120/82, pulse 62, temperature 97.7 F (36.5 C), temperature source Oral, resp. rate 18, height 5\' 7"  (1.702 m), weight 54 kg (119 lb), SpO2 100 %.Body mass index is 18.64 kg/m.  See md's SRA   Have you used any form of tobacco in the last 30 days? (Cigarettes, Smokeless Tobacco, Cigars, and/or Pipes): Yes  Has this patient used any form of tobacco in the last 30 days? (Cigarettes, Smokeless Tobacco, Cigars, and/or Pipes)No  Blood Alcohol level:  Lab Results  Component Value Date   ETH <5 04/09/2016   ETH <5 [id number]   Metabolic Disorder Labs:  Lab Results  Component Value Date   HGBA1C 5.5 04/23/2014   MPG 111 04/23/2014   MPG 105 04/08/2014   No results found for: PROLACTIN No results found for: CHOL, TRIG, HDL, CHOLHDL, VLDL, LDLCALC  See Psychiatric Specialty Exam and Suicide Risk Assessment completed by Attending Physician prior to discharge.  Discharge destination:  Home  Is patient on multiple antipsychotic therapies at discharge:  No   Has Patient had three or more failed trials of antipsychotic monotherapy by history:  No  Recommended Plan for Multiple Antipsychotic Therapies: NA  Allergies as of 04/13/2016   No Known Allergies     Medication List    STOP taking these medications   ranitidine 150 MG  tablet Commonly known as:  ZANTAC     TAKE these medications     Indication  hydrOXYzine 25 MG tablet Commonly known as:  ATARAX/VISTARIL Take 1 tablet (25 mg total) by mouth 3 (three) times daily as needed for anxiety.  Indication:  Anxiety Neurosis   mirtazapine 30 MG tablet Commonly known as:  REMERON Take 1 tablet (30 mg total) by mouth at bedtime. For depression/insomnia  Indication:  Trouble Sleeping, Major Depressive Disorder   traZODone 50 MG tablet Commonly known as:  DESYREL Take 1 tablet (50 mg total) by mouth at bedtime as needed for sleep. What changed:  medication strength  how much to take  Indication:  [place] Follow up on 04/14/2016.   Why:  Appt on this date at 10:30AM for hospital follow-up. Thank you.  Contact information: [place] [id number] [phone]          Follow-up recommendations: Activity:  As tolerated Diet: As recommended by your primary care doctor. Keep all scheduled follow-up appointments as recommended.  Comments: Patient is instructed prior to discharge to: Take all medications as prescribed by his/her mental healthcare provider. Report any adverse effects and or reactions from the medicines to his/her outpatient provider promptly. Patient has been instructed & cautioned: To not engage in alcohol and or illegal drug use while on prescription medicines. In the event of worsening symptoms, patient is instructed to call the crisis hotline, 911 and or go to the nearest ED for appropriate evaluation and treatment of symptoms. To follow-up with his/her primary care provider for your other medical issues, concerns and or health care needs.   Signed: Encarnacion Slates, NP, PMHNP, FNP-BC 04/13/2016, 10:39 AM

## 2016-05-03 ENCOUNTER — Encounter: Payer: Self-pay | Admitting: Internal Medicine
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
## 2017-04-28 ENCOUNTER — Other Ambulatory Visit: Payer: Self-pay

## 2017-04-28 ENCOUNTER — Emergency Department (HOSPITAL_COMMUNITY)
Admission: EM | Admit: 2017-04-28 | Discharge: 2017-04-28 | Disposition: A | Discharge status: Home / Self Care | Payer: Self-pay | Attending: Emergency Medicine | Admitting: Emergency Medicine

## 2017-04-28 ENCOUNTER — Encounter (HOSPITAL_COMMUNITY): Payer: Self-pay | Admitting: Emergency Medicine

## 2017-04-28 DIAGNOSIS — F1010 Alcohol abuse, uncomplicated: Secondary | ICD-10-CM | POA: Insufficient documentation

## 2017-04-28 DIAGNOSIS — R531 Weakness: Secondary | ICD-10-CM | POA: Insufficient documentation

## 2017-04-28 DIAGNOSIS — E860 Dehydration: Secondary | ICD-10-CM | POA: Insufficient documentation

## 2017-04-28 DIAGNOSIS — F17290 Nicotine dependence, other tobacco product, uncomplicated: Secondary | ICD-10-CM | POA: Insufficient documentation

## 2017-04-28 DIAGNOSIS — E872 Acidosis: Secondary | ICD-10-CM | POA: Insufficient documentation

## 2017-04-28 DIAGNOSIS — F4312 Post-traumatic stress disorder, chronic: Secondary | ICD-10-CM | POA: Insufficient documentation

## 2017-04-28 DIAGNOSIS — E8729 Other acidosis: Secondary | ICD-10-CM

## 2017-04-28 DIAGNOSIS — R110 Nausea: Secondary | ICD-10-CM | POA: Insufficient documentation

## 2017-04-28 LAB — URINALYSIS, ROUTINE W REFLEX MICROSCOPIC
BILIRUBIN URINE: NEGATIVE
GLUCOSE, UA: 50 mg/dL — AB
KETONES UR: 80 mg/dL — AB
LEUKOCYTES UA: NEGATIVE
NITRITE: NEGATIVE
PH: 6 (ref 5.0–8.0)
Protein, ur: 30 mg/dL — AB
SPECIFIC GRAVITY, URINE: 1.027 (ref 1.005–1.030)

## 2017-04-28 LAB — COMPREHENSIVE METABOLIC PANEL
ALT: 53 U/L (ref 17–63)
AST: 66 U/L — AB (ref 15–41)
Albumin: 4.8 g/dL (ref 3.5–5.0)
Alkaline Phosphatase: 61 U/L (ref 38–126)
Anion gap: 16 — ABNORMAL HIGH (ref 5–15)
BUN: 11 mg/dL (ref 6–20)
CHLORIDE: 91 mmol/L — AB (ref 101–111)
CO2: 28 mmol/L (ref 22–32)
CREATININE: 0.76 mg/dL (ref 0.61–1.24)
Calcium: 9.3 mg/dL (ref 8.9–10.3)
GFR calc Af Amer: >60 mL/min (ref >60)
Glucose, Bld: 72 mg/dL (ref 65–99)
Potassium: 4.1 mmol/L (ref 3.5–5.1)
Sodium: 135 mmol/L (ref 135–145)
TOTAL PROTEIN: 7.4 g/dL (ref 6.5–8.1)
Total Bilirubin: 1.4 mg/dL — ABNORMAL HIGH (ref 0.3–1.2)

## 2017-04-28 LAB — CBC
HCT: 42.8 % (ref 39.0–52.0)
Hemoglobin: 15.1 g/dL (ref 13.0–17.0)
MCH: 30.8 pg (ref 26.0–34.0)
MCHC: 35.3 g/dL (ref 30.0–36.0)
MCV: 87.3 fL (ref 78.0–100.0)
PLATELETS: 144 10*3/uL — AB (ref 150–400)
RBC: 4.9 MIL/uL (ref 4.22–5.81)
RDW: 13.7 % (ref 11.5–15.5)
WBC: 7.9 10*3/uL (ref 4.0–10.5)

## 2017-04-28 LAB — PHOSPHORUS: PHOSPHORUS: 3.1 mg/dL (ref 2.5–4.6)

## 2017-04-28 LAB — LIPASE, BLOOD: LIPASE: 56 U/L — AB (ref 11–51)

## 2017-04-28 LAB — MAGNESIUM: Magnesium: 2.2 mg/dL (ref 1.7–2.4)

## 2017-04-28 LAB — ETHANOL: Alcohol, Ethyl (B): <10 mg/dL (ref <10)

## 2017-04-28 MED ORDER — LORAZEPAM 2 MG/ML IJ SOLN
1.0000 mg | Freq: Once | INTRAMUSCULAR | Status: AC
  Administered 2017-04-28: 1 mg via INTRAVENOUS
  Filled 2017-04-28: qty 1

## 2017-04-28 MED ORDER — CHLORDIAZEPOXIDE HCL 25 MG PO CAPS: ORAL_CAPSULE | ORAL | 0 refills | Status: AC

## 2017-04-28 MED ORDER — DEXTROSE-NACL 5-0.9 % IV SOLN
Freq: Once | INTRAVENOUS | Status: AC
  Administered 2017-04-28: 16:00:00 via INTRAVENOUS

## 2017-04-28 MED ORDER — SODIUM CHLORIDE 0.9 % IV SOLN
1.0000 mg | Freq: Once | INTRAVENOUS | Status: AC
  Administered 2017-04-28: 1 mg via INTRAVENOUS
  Filled 2017-04-28: qty 0.2

## 2017-04-28 MED ORDER — THIAMINE HCL 100 MG/ML IJ SOLN
100.0000 mg | Freq: Once | INTRAMUSCULAR | Status: AC
  Administered 2017-04-28: 100 mg via INTRAVENOUS
  Filled 2017-04-28: qty 2

## 2017-04-28 NOTE — ED Triage Notes (Signed)
Pt ongoing diarrhea and nausea for 2 weeks.

## 2017-04-28 NOTE — Discharge Instructions (Addendum)
Please follow with your primary care doctor in the next 2 days for a check-up. They must obtain records for further management.   Do not hesitate to return to the Emergency Department for any new, worsening or concerning symptoms.    AA: [place] Site: [email or web address] Answering Service: [phone] Main: [phone] Site: [email or web address]

## 2017-04-28 NOTE — ED Provider Notes (Signed)
[facility] DEPT Provider Note   CSN: [id number] Arrival date & time: 04/28/17  1047     History   Chief Complaint Chief Complaint  Patient presents with  . Diarrhea     HPI   Blood pressure (!) 148/95, pulse 89, temperature 98 F (36.7 C), temperature source Oral, resp. rate 18, height 5\' 9"  (1.753 m), weight 53.5 kg (118 lb), SpO2 96 %.  Samuel [name] is a 37 y.o. male test medical history significant for alcohol abuse, PTSD complaining of feeling like he is "dehydrated."  This patient "fell off the wagon" 2 weeks ago after having personal stress in the home.  He states his last alcohol intake was yesterday.  He does not have any history of DTs including no history of seizure or hallucination feel kind of shaky today.  Triage note states that he had been having diarrhea for 2 weeks however he had diarrhea for 3 days 2 weeks ago which spontaneously resolved.  It was nonbloody, non-melanotic.  He had an episode of emesis which was also nonbloody over the last several days.  He is tolerating p.o.'s but he feels generally weak.  He denies any focal abdominal pain, chest pain, shortness of breath.  Suicidal ideation Seidel ideation, auditory or visual hallucinations, he was on depression medication in the remote past but he has not been on it recently.  He did feel that he would benefit from depression medication.    Past Medical History:  Diagnosis Date  . Alcohol abuse   . Diverticulitis of colon with bleeding 2010   Hospitalized in 2010 for "intestinal infection requiring IV antibiotics"  . Dysphagia   . Elevated LFTs   . GERD (gastroesophageal reflux disease)   . H/O hiatal hernia    pt states he had an EGD at age 16 and had a hiatal hernia  . Hypoglycemia    recurrent  . PTSD (post-traumatic stress disorder)     Patient Active Problem List   Diagnosis Date Noted  . 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 Depression with anxiety   .  Malnutrition of moderate degree ([place]) 09/17/2014  . Alcohol withdrawal ([place]) 09/16/2014  . ETOH abuse   . Depression   . PTSD (post-traumatic stress disorder)   . Intractable nausea and vomiting 04/08/2014  . Transaminitis 08/26/2013  . Hypoglycemia 08/26/2013  . 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 Leukocytosis 05/28/2012  . 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 GERD (gastroesophageal reflux disease) 05/13/2011  . Alcohol abuse 05/13/2011  . Fatty liver 05/13/2011    Past Surgical History:  Procedure Laterality Date  . COLONOSCOPY  05/15/2011   Procedure: COLONOSCOPY;  Surgeon: Irene Shipper, MD;  Location: [facility] ENDOSCOPY;  Service: Endoscopy;  Laterality: N/A;  . ESOPHAGOGASTRODUODENOSCOPY     age 24  . ESOPHAGOGASTRODUODENOSCOPY  05/15/2011   Procedure: ESOPHAGOGASTRODUODENOSCOPY (EGD);  Surgeon: Irene Shipper, MD;  Location: [facility] ENDOSCOPY;  Service: Endoscopy;  Laterality: N/A;  . GIVENS CAPSULE STUDY  05/30/2011   Procedure: GIVENS CAPSULE STUDY;  Surgeon: Irene Shipper, MD;  Location: [facility];  Service: Endoscopy;  Laterality: N/A;  Givens Capsule study.  . TONSILLECTOMY         Home Medications    Prior to Admission medications   Medication Sig Start Date End Date Taking? Authorizing Provider  ranitidine (ZANTAC) 150 MG tablet Take 150 mg by mouth 2 (two) times daily as needed for heartburn.   Yes [provider]  chlordiazePOXIDE (LIBRIUM) 25 MG capsule 50mg  PO TID x 1D, then 25-50mg  PO BID X 1D, then 25-50mg  PO QD X 1D 04/28/17   Starleen Trussell, Elmyra Ricks, PA-C  hydrOXYzine (ATARAX/VISTARIL) 25 MG tablet Take 1 tablet (25 mg total) by mouth 3 (three) times daily as needed for anxiety. Patient not taking: Reported on 04/28/2017 04/13/16   Lindell Spar I, NP  mirtazapine (REMERON) 30 MG tablet Take 1 tablet (30 mg total) by mouth at bedtime. For depression/insomnia Patient not taking: Reported on 04/28/2017 04/13/16   Lindell Spar I, NP  traZODone (DESYREL) 50  MG tablet Take 1 tablet (50 mg total) by mouth at bedtime as needed for sleep. Patient not taking: Reported on 04/28/2017 04/13/16   Lindell Spar I, NP    Family History Family History  Problem Relation Age of Onset  . Diabetes type II Mother   . Emphysema Father   . Heart attack Father   . Stroke Father   . Diabetes type II Other   . Diabetes type II Maternal Aunt     Social History Social History   Tobacco Use  . Smoking status: Never Smoker  . Smokeless tobacco: Current User    Types: Chew  . Tobacco comment: States he has been chewing tobacco almost daily since age 25  Substance Use Topics  . Alcohol use: Yes    Alcohol/week: 1.8 oz    Types: 3 Cans of beer per week    Comment: average six 12oz beers/day, ranges from 3-12 12oz/day  . Drug use: No     Allergies   Patient has no known allergies.   Review of Systems Review of Systems  A complete review of systems was obtained and all systems are negative except as noted in the HPI and PMH.   Physical Exam Updated Vital Signs BP 134/86 (BP Location: Right Arm)   Pulse 95   Temp 98 F (36.7 C) (Oral)   Resp 16   Ht 5\' 9"  (1.753 m)   Wt 53.5 kg (118 lb)   SpO2 95%   BMI 17.43 kg/m   Physical Exam  Constitutional: He is oriented to person, place, and time. He appears well-developed. No distress.  Poorly nourished  HENT:  Head: Normocephalic and atraumatic.  Dry MM  Eyes: Conjunctivae and EOM are normal. Pupils are equal, round, and reactive to light.  Neck: Normal range of motion.  Cardiovascular: Normal rate, regular rhythm and intact distal pulses. Exam reveals no friction rub.  Pulmonary/Chest: Effort normal and breath sounds normal.  Abdominal: Soft. There is no tenderness.  Musculoskeletal: Normal range of motion.  Neurological: He is alert and oriented to person, place, and time.  Positive tongue fasciculations, no asterixis   Skin: He is not diaphoretic.  Psychiatric: He has a normal mood and  affect.  Nursing note and vitals reviewed.    ED Treatments / Results  Labs (all labs ordered are listed, but only abnormal results are displayed) Labs Reviewed  LIPASE, BLOOD - Abnormal; Notable for the following components:      Result Value   Lipase 56 (*)    All other components within normal limits  COMPREHENSIVE METABOLIC PANEL - Abnormal; Notable for the following components:   Chloride 91 (*)    AST 66 (*)    Total Bilirubin 1.4 (*)    Anion gap 16 (*)    All other components within normal limits  CBC - Abnormal; Notable for the following components:   Platelets 144 (*)  All other components within normal limits  URINALYSIS, ROUTINE W REFLEX MICROSCOPIC - Abnormal; Notable for the following components:   Color, Urine AMBER (*)    Glucose, UA 50 (*)    Hgb urine dipstick MODERATE (*)    Ketones, ur 80 (*)    Protein, ur 30 (*)    Bacteria, UA FEW (*)    Squamous Epithelial / LPF 0-5 (*)    All other components within normal limits  MAGNESIUM  PHOSPHORUS  ETHANOL    EKG  EKG Interpretation None       Radiology No results found.  Procedures Procedures (including critical care time)  Medications Ordered in ED Medications  thiamine (B-1) injection 100 mg (100 mg Intravenous Given 0/27/25 3664)  folic acid 1 mg in sodium chloride 0.9 % 50 mL IVPB (0 mg Intravenous Stopped 04/28/17 1757)  dextrose 5 %-0.9 % sodium chloride infusion ( Intravenous Stopped 04/28/17 1806)  LORazepam (ATIVAN) injection 1 mg (1 mg Intravenous Given 04/28/17 1804)     Initial Impression / Assessment and Plan / ED Course  I have reviewed the triage vital signs and the nursing notes.  Pertinent labs & imaging results that were available during my care of the patient were reviewed by me and considered in my medical decision making (see chart for details).     Vitals:   04/28/17 1754 04/28/17 1800 04/28/17 1836 04/28/17 1838  BP: (!) 139/91 134/86  134/86  Pulse: 89 88 95 95    Resp: 16 13 17 16   Temp:    98 F (36.7 C)  TempSrc:    Oral  SpO2: 97% 96% 97% 95%  Weight:      Height:        Medications  thiamine (B-1) injection 100 mg (100 mg Intravenous Given 06/07/45 4259)  folic acid 1 mg in sodium chloride 0.9 % 50 mL IVPB (0 mg Intravenous Stopped 04/28/17 1757)  dextrose 5 %-0.9 % sodium chloride infusion ( Intravenous Stopped 04/28/17 1806)  LORazepam (ATIVAN) injection 1 mg (1 mg Intravenous Given 04/28/17 1804)    Samuel [name] is 37 y.o. male presenting with generalized weakness, he feels like he is dehydrated.  He has been drinking heavily over the course of the last 2 weeks.  He was clean for 6 months before that.  Patient with reassuring vital signs, he does not appear to be withdrawal.  He has no history of DTs.  He does state that he wants to stop drinking.  Labs are consistent with alcoholic ketoacidosis.  Patient treated and feels much better.  Outpatient resource guide given and Librium taper given, he understands he can return to the ED at any time.  Evaluation does not show pathology that would require ongoing emergent intervention or inpatient treatment. Pt is hemodynamically stable and mentating appropriately. Discussed findings and plan with patient/guardian, who agrees with care plan. All questions answered. Return precautions discussed and outpatient follow up given.      Final Clinical Impressions(s) / ED Diagnoses   Final diagnoses:  Dehydration  Alcoholic ketoacidosis    ED Discharge Orders        Ordered    chlordiazePOXIDE (LIBRIUM) 25 MG capsule     04/28/17 1556       Gerber Penza, Charna Elizabeth 04/28/17 1928    Malvin Johns, MD 04/28/17 2355

## 2018-09-04 ENCOUNTER — Observation Stay
Admission: EM | Admit: 2018-09-04 | Discharge: 2018-09-05 | Disposition: A | Discharge status: Home / Self Care | Payer: 59 | Attending: Internal Medicine | Admitting: Internal Medicine

## 2018-09-04 ENCOUNTER — Other Ambulatory Visit: Payer: Self-pay

## 2018-09-04 ENCOUNTER — Emergency Department (HOSPITAL_COMMUNITY): Payer: MEDICAID | Admitting: Radiology

## 2018-09-04 ENCOUNTER — Encounter (HOSPITAL_COMMUNITY): Payer: Self-pay

## 2018-09-04 ENCOUNTER — Emergency Department (HOSPITAL_COMMUNITY): Payer: MEDICAID

## 2018-09-04 ENCOUNTER — Observation Stay (HOSPITAL_COMMUNITY): Payer: Self-pay | Admitting: Hospitalist

## 2018-09-04 DIAGNOSIS — Z681 Body mass index (BMI) 19 or less, adult: Secondary | ICD-10-CM | POA: Insufficient documentation

## 2018-09-04 DIAGNOSIS — R42 Dizziness and giddiness: Secondary | ICD-10-CM

## 2018-09-04 DIAGNOSIS — E872 Acidosis, unspecified: Secondary | ICD-10-CM

## 2018-09-04 DIAGNOSIS — E162 Hypoglycemia, unspecified: Secondary | ICD-10-CM | POA: Insufficient documentation

## 2018-09-04 DIAGNOSIS — E8729 Other acidosis: Secondary | ICD-10-CM | POA: Diagnosis present

## 2018-09-04 DIAGNOSIS — F3289 Other specified depressive episodes: Secondary | ICD-10-CM | POA: Insufficient documentation

## 2018-09-04 DIAGNOSIS — E875 Hyperkalemia: Secondary | ICD-10-CM | POA: Insufficient documentation

## 2018-09-04 DIAGNOSIS — E43 Unspecified severe protein-calorie malnutrition: Secondary | ICD-10-CM | POA: Insufficient documentation

## 2018-09-04 DIAGNOSIS — Z720 Tobacco use: Secondary | ICD-10-CM | POA: Insufficient documentation

## 2018-09-04 DIAGNOSIS — R1110 Vomiting, unspecified: Secondary | ICD-10-CM

## 2018-09-04 DIAGNOSIS — R112 Nausea with vomiting, unspecified: Secondary | ICD-10-CM | POA: Insufficient documentation

## 2018-09-04 DIAGNOSIS — F4320 Adjustment disorder, unspecified: Secondary | ICD-10-CM | POA: Insufficient documentation

## 2018-09-04 LAB — VENOUS BLOOD GAS
%FIO2 (VENOUS): 21 %
BASE DEFICIT: 10.6 mmol/L — ABNORMAL HIGH (ref ?–2.0)
BASE DEFICIT: 10 mmol/L — ABNORMAL HIGH (ref ?–2.0)
O2 SATURATION (VENOUS): 35.8 % — ABNORMAL LOW (ref 95.0–98.0)
PCO2 (VENOUS): 44 mmHg (ref 38.00–50.00)
PH (VENOUS): 7.21 — CL (ref 7.32–7.45)
PO2 (VENOUS): 35 mmHg (ref 30.0–50.0)

## 2018-09-04 LAB — CBC WITH DIFF
BASOPHIL #: 0 10*3/uL (ref 0.00–0.20)
BASOPHIL %: 0 %
EOSINOPHIL #: 0 10*3/uL (ref 0.00–0.50)
HGB: 13.9 g/dL (ref 13.4–17.3)
LYMPHOCYTE #: 1.1 10*3/uL (ref 0.80–3.20)
LYMPHOCYTE %: 7 %
MCH: 30.9 pg (ref 27.9–33.1)
MCV: 91.7 fL (ref 82.4–95.0)
MONOCYTE #: 0.4 10*3/uL (ref 0.20–0.80)
MPV: 7.1 fL (ref 6.0–10.2)
NEUTROPHIL #: 13.2 10*3/uL — ABNORMAL HIGH (ref 1.60–5.50)
NEUTROPHIL %: 90 %
PLATELETS: 238 10*3/uL (ref 140–440)
RBC: 4.51 10*6/uL (ref 4.40–5.68)
RDW: 13.9 % (ref 10.9–15.1)
WBC: 14.7 10*3/uL — ABNORMAL HIGH (ref 3.3–9.3)

## 2018-09-04 LAB — URINALYSIS, MACRO/MICRO
BILIRUBIN: NEGATIVE mg/dL
LEUKOCYTES: NEGATIVE WBCs/uL
NITRITE: NEGATIVE
PH: 5.5 (ref 5.0–7.0)
SPECIFIC GRAVITY: 1.022 (ref 1.010–1.025)

## 2018-09-04 LAB — BASIC METABOLIC PANEL
ANION GAP: 16 mmol/L
BUN/CREA RATIO: 16
BUN/CREA RATIO: 15
BUN: 15 mg/dL (ref 10–25)
BUN: 13 mg/dL (ref 10–25)
CALCIUM: 9.7 mg/dL (ref 8.8–10.3)
CALCIUM: 8 mg/dL — ABNORMAL LOW (ref 8.8–10.3)
CHLORIDE: 96 mmol/L — ABNORMAL LOW (ref 98–111)
CHLORIDE: 102 mmol/L (ref 98–111)
CO2 TOTAL: 17 mmol/L — ABNORMAL LOW (ref 21–35)
CREATININE: 0.94 mg/dL (ref <=1.30)
ESTIMATED GFR: >60 mL/min/{1.73_m2}
ESTIMATED GFR: >60 mL/min/{1.73_m2}
GLUCOSE: 64 mg/dL — ABNORMAL LOW (ref 70–110)
POTASSIUM: 5.6 mmol/L — ABNORMAL HIGH (ref 3.5–5.0)
POTASSIUM: 5 mmol/L (ref 3.5–5.0)
SODIUM: 135 mmol/L (ref 135–145)

## 2018-09-04 LAB — ECG 12 LEAD - ED USE
Calculated P Axis: 70 deg
I 40 Axis: 77 deg
QRS Axis: 63 deg
QRS Duration: 79 ms
QT Interval: 364 ms
QTC Calculation: 412 ms
ST Axis: 68 deg
T 40 Axis: 53 deg

## 2018-09-04 LAB — SALICYLATE ACID LEVEL: SALICYLATE LEVEL: <4 mg/dL (ref <30)

## 2018-09-04 LAB — POC BLOOD GLUCOSE (RESULTS): GLUCOSE, POC: 76 mg/dL (ref 70–110)

## 2018-09-04 LAB — TROPONIN-I: TROPONIN I: 0.01 ng/mL (ref <=0.04)

## 2018-09-04 LAB — ACETAMINOPHEN LEVEL: ACETAMINOPHEN LEVEL: <10 ug/mL — ABNORMAL LOW (ref 10–30)

## 2018-09-04 MED ORDER — SODIUM CHLORIDE 0.9 % IV BOLUS
1000.0000 mL | INJECTION | Status: AC
Start: 2018-09-04 — End: 2018-09-04
  Administered 2018-09-04: 1000 mL via INTRAVENOUS
  Administered 2018-09-04: 22:00:00 0 mL via INTRAVENOUS

## 2018-09-04 MED ORDER — SODIUM CHLORIDE 0.9 % INJECTION SOLUTION
10.00 mL | INTRAMUSCULAR | Status: DC
Start: 2018-09-04 — End: 2018-09-05

## 2018-09-04 MED ORDER — SODIUM CHLORIDE 0.9 % IV BOLUS
1000.0000 mL | INJECTION | Status: AC
Start: 2018-09-04 — End: 2018-09-04
  Administered 2018-09-04: 20:00:00 1000 mL via INTRAVENOUS
  Administered 2018-09-04: 20:00:00 0 mL via INTRAVENOUS

## 2018-09-04 MED ORDER — IOVERSOL 320 MG IODINE/ML INTRAVENOUS SOLUTION
95.00 mL | INTRAVENOUS | Status: AC
Start: 2018-09-04 — End: 2018-09-04
  Administered 2018-09-04: 95 mL via INTRAVENOUS

## 2018-09-04 MED ORDER — ONDANSETRON HCL (PF) 4 MG/2 ML INJECTION SOLUTION
4.0000 mg | INTRAMUSCULAR | Status: AC
Start: 2018-09-04 — End: 2018-09-04
  Administered 2018-09-04: 4 mg via INTRAVENOUS
  Filled 2018-09-04: qty 2

## 2018-09-04 NOTE — ED Provider Notes (Signed)
Emergency Department  Provider Note    Name: [name] [name]  Age and Gender: 38 y.o. male  PCP: No Pcp    Triage Note:   Dizziness (Pt arrived to the Ed with c/o lightheaded dizziness and vomiting this am. Finger stick 76)      HPI:  [name] [name] is a 38 y.o. male  who presents to the ED today for acute, persistent nausea, vomiting, lightheadedness, and generalized weakness.    Patient reports he was feeling unwell immediately after waking up this morning. States he went to the bathroom and developed sudden-onset nausea and diaphoresis, that subsequently followed with multiple episodes of vomiting.    Throughout the day, his symptoms continued to mildly improve. Although notes that he now has a dry mouth and feels dehydrated secondary to his multiple episodes of vomiting.    Patient additionally mentions that he has been having recurrent low blood sugars and substantial weight loss as of late.    No recent sick contacts.    Denies fevers, chills, chest pain, congestion, shortness of breath, cough, diarrhea, constipation, and all other associated sx and conditions at this time.      Review of Systems:  Review of Systems   Constitutional: Positive for diaphoresis, malaise/fatigue and weight loss.        Positive for dry mouth and low blood sugars.    HENT: Negative.    Eyes: Negative.    Respiratory: Negative.    Cardiovascular: Negative.    Gastrointestinal: Positive for nausea and vomiting. Negative for constipation and diarrhea.   Genitourinary: Negative.    Musculoskeletal: Negative.    Skin: Negative.    Neurological: Positive for weakness (generalized).        Positive for lightheadedness.       Below information reviewed with patient where pertinent:  No past medical history on file.        No current outpatient medications on file.       No Known Allergies    No past surgical history pertinent negatives on file.        Social History     Socioeconomic History   . Marital status: Single     Spouse  name: Not on file   . Number of children: Not on file   . Years of education: Not on file   . Highest education level: Not on file   Occupational History   . Not on file   Social Needs   . Financial resource strain: Not on file   . Food insecurity     Worry: Not on file     Inability: Not on file   . Transportation needs     Medical: Not on file     Non-medical: Not on file   Tobacco Use   . Smoking status: Not on file   Substance and Sexual Activity   . Alcohol use: Not on file   . Drug use: Not on file   . Sexual activity: Not on file   Lifestyle   . Physical activity     Days per week: Not on file     Minutes per session: Not on file   . Stress: Not on file   Relationships   . Social Product manager on phone: Not on file     Gets together: Not on file     Attends religious service: Not on file     Active member of club or organization: Not  on file     Attends meetings of clubs or organizations: Not on file     Relationship status: Not on file   . Intimate partner violence     Fear of current or ex partner: Not on file     Emotionally abused: Not on file     Physically abused: Not on file     Forced sexual activity: Not on file   Other Topics Concern   . Not on file   Social History Narrative   . Not on file       Objective:  Nursing notes reviewed    ED Triage Vitals [09/04/18 1707]   BP (Non-Invasive) (!) 152/73   Heart Rate (!) 122   Respiratory Rate 18   Temperature 37.2 C (99 F)   SpO2 96 %   Weight 49.3 kg (108 lb 9.6 oz)   Height 1.727 m (_0 )     Filed Vitals:    09/04/18 1707 09/04/18 1830 09/04/18 1930 09/04/18 2000   BP: (!) 152/73 127/86 120/76 115/80   Pulse: (!) 122  87 89   Resp: _1 Temp: 37.2 C (99 F)      SpO2: 96%  100% 100%       Physical Exam  Physical Exam   Constitutional: He is oriented to person, place, and time. He appears unhealthy. He appears cachectic. He appears distressed.   No acute distress.    HENT:   Head: Normocephalic and atraumatic.   Eyes: Pupils are  equal, round, and reactive to light. Conjunctivae and EOM are normal.   Neck: Normal range of motion. Neck supple.   Cardiovascular: Regular rhythm, normal heart sounds and intact distal pulses. Tachycardia present.   Pulmonary/Chest: Effort normal and breath sounds normal. No respiratory distress.   Abdominal: Soft. Bowel sounds are normal.   Musculoskeletal: Normal range of motion.   Neurological: He is alert and oriented to person, place, and time. GCS score is 15.   Skin: Skin is warm and dry. There is pallor.   Psychiatric: Mood, memory, affect and judgment normal.       Labs:   Labs Reviewed   BASIC METABOLIC PANEL - Abnormal; Notable for the following components:       Result Value    POTASSIUM 5.6 (*)     CHLORIDE 96 (*)     CO2 TOTAL 18 (*)     GLUCOSE 68 (*)     All other components within normal limits    Narrative:     Estimated Glomerular Filtration Rate (eGFR) calculated using the CKD-EPI (2009) equation, intended for patients 8 years of age and older. If race and/or gender is not documented or "unknown," there will be no eGFR calculation.   THYROID STIMULATING HORMONE WITH FREE T4 REFLEX - Abnormal; Notable for the following components:    TSH 0.272 (*)     All other components within normal limits   CBC WITH DIFF - Abnormal; Notable for the following components:    WBC 14.7 (*)     NEUTROPHIL # 13.20 (*)     All other components within normal limits   URINALYSIS, MACRO/MICRO - Abnormal; Notable for the following components:    KETONES Large (*)     BLOOD Small (*)     RBCS 0-2 (*)     WBCS 0-5 (*)     All other components within normal limits   VENOUS BLOOD GAS - Abnormal; Notable  for the following components:    BICARBONATE (VENOUS) 15.5 (*)     PH (VENOUS) 7.23 (*)     BASE DEFICIT 10.6 (*)     O2 SATURATION (VENOUS) 58.5 (*)     All other components within normal limits   TROPONIN-I - Normal   THYROXINE, FREE (FREE T4) - Normal   POC BLOOD GLUCOSE (RESULTS) - Normal   URINE CULTURE,ROUTINE      CBC/DIFF    Narrative:     The following orders were created for panel order CBC/DIFF.  Procedure                               Abnormality         Status                     ---------                               -----------         ------                     CBC WITH ZCHY[[id number]]                Abnormal            Final result                 Please view results for these tests on the individual orders.   URINALYSIS WITH REFLEX MICROSCOPIC AND CULTURE IF POSITIVE    Narrative:     The following orders were created for panel order URINALYSIS WITH REFLEX MICROSCOPIC AND CULTURE IF POSITIVE.  Procedure                               Abnormality         Status                     ---------                               -----------         ------                     URINALYSIS, MACRO/MICRO[[id number]]      Abnormal            Final result                 Please view results for these tests on the individual orders.       Imaging:  Results for orders placed or performed during the hospital encounter of 09/04/18   XR AP MOBILE CHEST     Status: None    Narrative    [name] [name]    PROCEDURE DESCRIPTION: XR AP MOBILE CHEST    CLINICAL INDICATION: nausea    TECHNIQUE: 1 views / 1 images submitted.    Heart size is normal. There is no focal consolidation or pulmonary edema.  No evidence of pleural effusion or pneumothorax.      Impression    No acute cardiopulmonary abnormality.           Radiologist location ID: [id number]     CT  ABDOMEN PELVIS W IV CONTRAST     Status: None    Narrative    [name] [name]    PROCEDURE DESCRIPTION: CT ABDOMEN PELVIS W IV CONTRAST    CLINICAL INDICATION: nausea and vomiting    No priors available for comparison.    FINDINGS: There is no evidence of pneumonia the visualized lung bases. A  few small pleural-based nodules at the bilateral bases measure up to  approximately 5 mm.    Fatty liver is noted. Gallbladder is mildly distended without evidence of  cholecystitis. The spleen is  normal. No evidence of pancreatitis.  Pancreatic duct is nondistended.    Adrenal glands are normal. Renal cortical thickness and enhancement are  normal. There is no hydronephrosis. No urinary tract calculus is detected.  Bladder is mildly distended.    Large and small bowel loops are of normal caliber. Stomach is mildly  distended. There is mild submucosal fat deposition accounting for wall  thickening of the ascending colon. The appendix is of normal size.    There is no free air or evidence of intra-abdominal abscess.    The portal vein is patent. Abdominal aorta is of normal size.      Impression    1. No acute abnormality within the abdomen or pelvis.  2. Mild fatty liver.  3. Small bilateral lung base nodules. Consider dedicated chest CT to  establish a baseline for follow-up.          The CT exam was performed using one or more the following a dose reduction  techniques: Automated exposure control, adjustment of the mA and/or kV  according to the patient's size, or use of iterative reconstruction  technique.        Radiologist location ID: [place] and imaging reviewed.    ECG:  I have review the EKG, my interpretation is as follows: Sinus Rhythm    Orders:  Orders Placed This Encounter   . URINE CULTURE,ROUTINE   . XR AP MOBILE CHEST   . CT ABDOMEN PELVIS W IV CONTRAST   . BASIC METABOLIC PANEL   . CBC/DIFF   . TSH W/ FREE T4 REFLEX   . TROPONIN-I   . URINALYSIS WITH REFLEX MICROSCOPIC AND CULTURE IF POSITIVE   . CBC WITH DIFF   . URINALYSIS, MACRO/MICRO   . VENOUS BLOOD GAS   . THYROXINE, FREE (FREE T4)   . ECG 12 LEAD - ED USE   . NS bolus infusion 1,000 mL   . ondansetron (ZOFRAN) 2 mg/mL injection   . ioversol (OPTIRAY 320) infusion   . NS 10 mL injection       Clinical Impression:   No diagnosis found.    Course and MDM:  Patient seen and examined.  [name] [name] is a 38 y.o. male who presented with nausea and vomiting.     The differential included, but was not limited to, viral  gastroenteritis, ACS, small-bowel obstruction, intra-abdominal pathology, volvulus.      Patient's history and physical were concerning for viral gastroenteritis versus infectious etiology.  We obtained lab work which was remarkable for an increased potassium of 5.6, increased white blood cell count of 14.7, decreased pH of 7.23, decreased bicarb of 15.5, decreased TSH of 0.27.  Patient's chest x-ray showed no acute cardiopulmonary process.  Due to patient's recent history of significant weight loss we also obtained a CT abdomen pelvis with contrast which showed no acute abnormality of  the abdomen or pelvis, mild fatty liver, small bilateral lung base nodules, consider dedicated chest CT scan follow-up.    At the end of my shift, care of [name] [name] was checked out to Dr. Nicki [name] following a discussion of the patient's course. See their ED Resident Course note for further details of patient care while in the Emergency Department.       Disposition: Data Unavailable    Follow up:   No follow-up provider specified.    I am scribing for, and in the presence of, Dr. Jaci Standard for services provided on 09/04/2018.  Jaquelyn Bitter, Scribe  [place], [place] 09/04/2018, 19:43    I personally performed the services described in this documentation, as scribed  in my presence, and it is both accurate  and complete.    Jaci Standard, MD    Jaci Standard, MD 09/04/2018, 18:17   PGY-1 Emergency Medicine  [facility]  Pager # [phone]    Parts of this patient's chart were completed in a retrospective fashion due to simultaneous direct patient care activities in the Emergency Department.     This note was written using MModal Fluency dictation software; due to this, some minor grammatical errors may be present.

## 2018-09-04 NOTE — ED Attending Handoff Note (Signed)
No Known Allergies    Vitals:    09/04/18 2100 09/04/18 2130 09/04/18 2200 09/04/18 2230   BP: 119/64 110/76 117/70 119/75   Pulse: 82 90 82 86   Resp: _0 Temp:       SpO2: 99% 100% 98% 97%   Weight:       Height:       BMI:               HPI:  In brief, patient is a 38 y.o. malewho presents to the emergency department due to general malaise. Body aches, losing weight, associated nausea, some dizziness today. Denies chest pain or shortness of breath.    Pertinent Exam Findings:  See prior attending note    Pertinent Imaging/Lab results:  Labs Reviewed   BASIC METABOLIC PANEL - Abnormal; Notable for the following components:       Result Value    POTASSIUM 5.6 (*)     CHLORIDE 96 (*)     CO2 TOTAL 18 (*)     GLUCOSE 68 (*)     All other components within normal limits    Narrative:     Estimated Glomerular Filtration Rate (eGFR) calculated using the CKD-EPI (2009) equation, intended for patients 49 years of age and older. If race and/or gender is not documented or "unknown," there will be no eGFR calculation.   THYROID STIMULATING HORMONE WITH FREE T4 REFLEX - Abnormal; Notable for the following components:    TSH 0.272 (*)     All other components within normal limits   CBC WITH DIFF - Abnormal; Notable for the following components:    WBC 14.7 (*)     NEUTROPHIL # 13.20 (*)     All other components within normal limits   URINALYSIS, MACRO/MICRO - Abnormal; Notable for the following components:    KETONES Large (*)     BLOOD Small (*)     RBCS 0-2 (*)     WBCS 0-5 (*)     All other components within normal limits   VENOUS BLOOD GAS - Abnormal; Notable for the following components:    BICARBONATE (VENOUS) 15.5 (*)     PH (VENOUS) 7.23 (*)     BASE DEFICIT 10.6 (*)     O2 SATURATION (VENOUS) 58.5 (*)     All other components within normal limits   ACETAMINOPHEN LEVEL - Abnormal; Notable for the following components:    ACETAMINOPHEN LEVEL <10 (*)     All other components within normal limits   VENOUS  BLOOD GAS - Abnormal; Notable for the following components:    BICARBONATE (VENOUS) 15.2 (*)     PH (VENOUS) 7.21 (*)     PO2 (VENOUS) 23.0 (*)     BASE DEFICIT 10.0 (*)     O2 SATURATION (VENOUS) 35.8 (*)     All other components within normal limits   BASIC METABOLIC PANEL - Abnormal; Notable for the following components:    CO2 TOTAL 17 (*)     CALCIUM 8.0 (*)     GLUCOSE 64 (*)     All other components within normal limits    Narrative:     Estimated Glomerular Filtration Rate (eGFR) calculated using the CKD-EPI (2009) equation, intended for patients 35 years of age and older. If race and/or gender is not documented or "unknown," there will be no eGFR calculation.   TROPONIN-I - Normal   THYROXINE, FREE (FREE T4) - Normal  SALICYLATE ACID LEVEL - Normal   POC BLOOD GLUCOSE (RESULTS) - Normal   URINE CULTURE,ROUTINE   CBC/DIFF    Narrative:     The following orders were created for panel order CBC/DIFF.  Procedure                               Abnormality         Status                     ---------                               -----------         ------                     CBC WITH [facility][[id number]]                Abnormal            Final result                 Please view results for these tests on the individual orders.   URINALYSIS WITH REFLEX MICROSCOPIC AND CULTURE IF POSITIVE    Narrative:     The following orders were created for panel order URINALYSIS WITH REFLEX MICROSCOPIC AND CULTURE IF POSITIVE.  Procedure                               Abnormality         Status                     ---------                               -----------         ------                     URINALYSIS, MACRO/MICRO[[id number]]      Abnormal            Final result                 Please view results for these tests on the individual orders.   HEPATIC FUNCTION PANEL   ETHANOL, SERUM     Results for orders placed or performed during the hospital encounter of 09/04/18 (from the past 24 hour(s))   XR AP MOBILE CHEST     Status:  None    Narrative    [name] [name]    PROCEDURE DESCRIPTION: XR AP MOBILE CHEST    CLINICAL INDICATION: nausea    TECHNIQUE: 1 views / 1 images submitted.    Heart size is normal. There is no focal consolidation or pulmonary edema.  No evidence of pleural effusion or pneumothorax.      Impression    No acute cardiopulmonary abnormality.           Radiologist location ID: [id number]     CT ABDOMEN PELVIS W IV CONTRAST     Status: None    Narrative    [name] [name]    PROCEDURE DESCRIPTION: CT ABDOMEN PELVIS W IV CONTRAST    CLINICAL INDICATION: nausea and vomiting    No priors available for  comparison.    FINDINGS: There is no evidence of pneumonia the visualized lung bases. A  few small pleural-based nodules at the bilateral bases measure up to  approximately 5 mm.    Fatty liver is noted. Gallbladder is mildly distended without evidence of  cholecystitis. The spleen is normal. No evidence of pancreatitis.  Pancreatic duct is nondistended.    Adrenal glands are normal. Renal cortical thickness and enhancement are  normal. There is no hydronephrosis. No urinary tract calculus is detected.  Bladder is mildly distended.    Large and small bowel loops are of normal caliber. Stomach is mildly  distended. There is mild submucosal fat deposition accounting for wall  thickening of the ascending colon. The appendix is of normal size.    There is no free air or evidence of intra-abdominal abscess.    The portal vein is patent. Abdominal aorta is of normal size.      Impression    1. No acute abnormality within the abdomen or pelvis.  2. Mild fatty liver.  3. Small bilateral lung base nodules. Consider dedicated chest CT to  establish a baseline for follow-up.          The CT exam was performed using one or more the following a dose reduction  techniques: Automated exposure control, adjustment of the mA and/or kV  according to the patient's size, or use of iterative reconstruction  technique.        Radiologist location ID:  [id number]           Pending Studies:  Repeat VBG, tylenol, and BMP    Consults:  None    After a thorough discussion of the patient including presentation, ED course, and review of above information I have assumed care of [name] [name] from Dr. Nicki Reaper at 00:19    Truitt Merle, MD    Course:  Orders Placed This Encounter   . URINE CULTURE,ROUTINE   . XR AP MOBILE CHEST   . CT ABDOMEN PELVIS W IV CONTRAST   . BASIC METABOLIC PANEL   . CBC/DIFF   . TSH W/ FREE T4 REFLEX   . TROPONIN-I   . URINALYSIS WITH REFLEX MICROSCOPIC AND CULTURE IF POSITIVE   . CBC WITH DIFF   . URINALYSIS, MACRO/MICRO   . VENOUS BLOOD GAS   . THYROXINE, FREE (FREE T4)   . ACETAMINOPHEN LEVEL   . SALICYLATE ACID LEVEL   . CANCELED: BASIC METABOLIC PANEL   . VENOUS BLOOD GAS   . BASIC METABOLIC PANEL   . HEPATIC FUNCTION PANEL   . ETHANOL, SERUM   . ECG 12 LEAD - ED USE   . NS bolus infusion 1,000 mL   . ondansetron (ZOFRAN) 2 mg/mL injection   . ioversol (OPTIRAY 320) infusion   . NS 10 mL injection   . NS bolus infusion 1,000 mL       Workup shows metabolic acidosis of unknown origin. Getting fluids and repeat labs.    Repeat lab work reveals an improved potassium of 5.0 following IV fluids.  Concerning though is the patient's acidosis has worsened since receiving fluids.  Patient states that his nausea vomiting is improved therefore he will receive some orange juice for his hypoglycemia.  I discussed admission with the patient.  He is agreeable to stay.  He does report a history of alcoholism in the past but states that he has discontinued alcohol use until about 3 days ago.  He did report that he  drinks approximately 18 beers over the past 3 days but his last drink was greater than 24 hours ago.  Patient does not currently appear intoxicated.  He does report a history of some elevation in his liver enzymes but states that this generally resolved spontaneously.  Will add as serum ethanol as well as a in hepatic function panel to his workup.   I discussed the case with Dr. Kathaleen Bury of the hospitalist service.  He is agreeable to admit the patient for further monitoring and treatment of his metabolic acidosis of unknown origin.  Patient family were given opportunity ask questions.  They expressed understanding were agreeable to admission.    Disposition:  Admitted    Encounter Diagnoses   Name Primary?   . Vomiting, intractability of vomiting not specified, presence of nausea not specified, unspecified vomiting type Yes   . Metabolic acidosis        Truitt Merle, MD  09/04/2018, 22:31

## 2018-09-04 NOTE — ED Attending Note (Signed)
Note begun by:  Etheleen Sia, MD  09/04/2018, 18:46    I was physically present and directly supervised this patient's care.  Patient seen and examined.  Resident / Aron Baba / NP history and exam reviewed.   Key elements in addition to and/or correction of that documentation are as follows:    HPI :    38 y.o. male presents with chief complaint of dizziness and fatigue. Woke up this morning with generalized body aches. Has had 3-4 episodes of vomiting and chills. Also having episodes of sweating. No diarrhea. No fever. No sick contact exposure. No chest pain or shortness of breath. Patient states he has lost a lot of weight recently.     PE :   VS on presentation: Blood pressure 127/86, pulse (!) 122, temperature 37.2 C (99 F), resp. rate 18, height 1.727 m ('5\' 8"' ), weight 49.3 kg (108 lb 9.6 oz), SpO2 96 %.     General - awake, alert, oriented, very thin  HEENT - dry mucous membranes, no evidence of trauma  Heart - regular rate and rhythm, normal perfusion  Lungs - clear to ascultation bilaterally, no respiratory distress  Abdomen - soft, non-tender, no distension  Extremities - FROM x4, no trauma or deformity      Data/Test :    EKG :  Sinus rhythm, no acute ischemic changes  Images Review by me : as per resident note  Image Reports Review by me :   CT ABDOMEN PELVIS W IV CONTRAST   Final Result   1. No acute abnormality within the abdomen or pelvis.   2. Mild fatty liver.   3. Small bilateral lung base nodules. Consider dedicated chest CT to   establish a baseline for follow-up.               The CT exam was performed using one or more the following a dose reduction   techniques: Automated exposure control, adjustment of the mA and/or kV   according to the patient's size, or use of iterative reconstruction   technique.            Radiologist location ID: [id number]         XR AP MOBILE CHEST   Final Result   No acute cardiopulmonary abnormality.                Radiologist location ID: [id number]                 Labs :   Labs Reviewed   BASIC METABOLIC PANEL - Abnormal; Notable for the following components:       Result Value    POTASSIUM 5.6 (*)     CHLORIDE 96 (*)     CO2 TOTAL 18 (*)     GLUCOSE 68 (*)     All other components within normal limits    Narrative:     Estimated Glomerular Filtration Rate (eGFR) calculated using the CKD-EPI (2009) equation, intended for patients 22 years of age and older. If race and/or gender is not documented or "unknown," there will be no eGFR calculation.   THYROID STIMULATING HORMONE WITH FREE T4 REFLEX - Abnormal; Notable for the following components:    TSH 0.272 (*)     All other components within normal limits   CBC WITH DIFF - Abnormal; Notable for the following components:    WBC 14.7 (*)     NEUTROPHIL # 13.20 (*)     All other components within  normal limits   URINALYSIS, MACRO/MICRO - Abnormal; Notable for the following components:    KETONES Large (*)     BLOOD Small (*)     RBCS 0-2 (*)     WBCS 0-5 (*)     All other components within normal limits   VENOUS BLOOD GAS - Abnormal; Notable for the following components:    BICARBONATE (VENOUS) 15.5 (*)     PH (VENOUS) 7.23 (*)     BASE DEFICIT 10.6 (*)     O2 SATURATION (VENOUS) 58.5 (*)     All other components within normal limits   ACETAMINOPHEN LEVEL - Abnormal; Notable for the following components:    ACETAMINOPHEN LEVEL <10 (*)     All other components within normal limits   VENOUS BLOOD GAS - Abnormal; Notable for the following components:    BICARBONATE (VENOUS) 15.2 (*)     PH (VENOUS) 7.21 (*)     PO2 (VENOUS) 23.0 (*)     BASE DEFICIT 10.0 (*)     O2 SATURATION (VENOUS) 35.8 (*)     All other components within normal limits   BASIC METABOLIC PANEL - Abnormal; Notable for the following components:    CO2 TOTAL 17 (*)     CALCIUM 8.0 (*)     GLUCOSE 64 (*)     All other components within normal limits    Narrative:     Estimated Glomerular Filtration Rate (eGFR) calculated using the CKD-EPI (2009) equation, intended  for patients 66 years of age and older. If race and/or gender is not documented or "unknown," there will be no eGFR calculation.   TROPONIN-I - Normal   THYROXINE, FREE (FREE T4) - Normal   SALICYLATE ACID LEVEL - Normal   POC BLOOD GLUCOSE (RESULTS) - Normal   URINE CULTURE,ROUTINE   CBC/DIFF    Narrative:     The following orders were created for panel order CBC/DIFF.  Procedure                               Abnormality         Status                     ---------                               -----------         ------                     CBC WITH HYQM[[id number]]                Abnormal            Final result                 Please view results for these tests on the individual orders.   URINALYSIS WITH REFLEX MICROSCOPIC AND CULTURE IF POSITIVE    Narrative:     The following orders were created for panel order URINALYSIS WITH REFLEX MICROSCOPIC AND CULTURE IF POSITIVE.  Procedure                               Abnormality         Status                     ---------                               -----------         ------  URINALYSIS, MACRO/MICRO[[id number]]      Abnormal            Final result                 Please view results for these tests on the individual orders.     * note - Tylenol level, repeat basic metabolic panel, repeat VBG pending at time of patient care transfer      Clinical Impression :  Pending        ED Course :  Patient seen evaluated remained in satisfactory condition throughout course.  Patient presents for her surroundings and generalized malaise with several weeks of significant weight loss and a few episodes of nausea vomiting today.  Patient's initial laboratory testing shows evidence of metabolic acidosis with significant anion gap.  No evidence of DKA or obvious source and identification.  Tests complete BG including aspirin and levels were ordered, patient is given initial L normal saline.  Following this patient is improved, we repeat basic metabolic and venous  gas to determine stability and possible improvement.  Patient care was transferred to Dr. Jon Billings labs, reassessment, and disposition pending            Dispo :   Pending    CRITICAL CARE : None

## 2018-09-05 ENCOUNTER — Other Ambulatory Visit: Payer: Self-pay

## 2018-09-05 ENCOUNTER — Encounter (HOSPITAL_COMMUNITY): Payer: Self-pay

## 2018-09-05 DIAGNOSIS — E872 Acidosis: Secondary | ICD-10-CM | POA: Diagnosis present

## 2018-09-05 DIAGNOSIS — F329 Major depressive disorder, single episode, unspecified: Secondary | ICD-10-CM

## 2018-09-05 DIAGNOSIS — E8729 Other acidosis: Secondary | ICD-10-CM | POA: Diagnosis present

## 2018-09-05 DIAGNOSIS — F1010 Alcohol abuse, uncomplicated: Secondary | ICD-10-CM

## 2018-09-05 DIAGNOSIS — E162 Hypoglycemia, unspecified: Secondary | ICD-10-CM | POA: Diagnosis present

## 2018-09-05 DIAGNOSIS — E875 Hyperkalemia: Secondary | ICD-10-CM

## 2018-09-05 DIAGNOSIS — R112 Nausea with vomiting, unspecified: Secondary | ICD-10-CM | POA: Diagnosis present

## 2018-09-05 LAB — HEPATIC FUNCTION PANEL
ALBUMIN: 4.2 g/dL (ref 3.2–4.6)
ALKALINE PHOSPHATASE: 58 U/L (ref 20–130)
ALT (SGPT): 8 U/L (ref <=52)
AST (SGOT): 19 U/L (ref <=35)
BILIRUBIN DIRECT: 0.2 mg/dL (ref <=0.3)
BILIRUBIN TOTAL: 1 mg/dL (ref 0.3–1.2)
PROTEIN TOTAL: 5.5 g/dL — ABNORMAL LOW (ref 6.0–8.3)

## 2018-09-05 LAB — BASIC METABOLIC PANEL
BUN/CREA RATIO: 14
BUN: 10 mg/dL (ref 10–25)
CALCIUM: 8.7 mg/dL — ABNORMAL LOW (ref 8.8–10.3)
CHLORIDE: 102 mmol/L (ref 98–111)
CO2 TOTAL: 27 mmol/L (ref 21–35)
ESTIMATED GFR: >60 mL/min/{1.73_m2}
GLUCOSE: 169 mg/dL — ABNORMAL HIGH (ref 70–110)
SODIUM: 135 mmol/L (ref 135–145)

## 2018-09-05 LAB — PHOSPHORUS: PHOSPHORUS: 1.1 mg/dL — CL (ref 2.5–4.5)

## 2018-09-05 LAB — CORTISOL, PLASMA OR SERUM: CORTISOL: 21.7 ug/dL

## 2018-09-05 MED ORDER — FAMOTIDINE 20 MG TABLET
20.00 mg | ORAL_TABLET | Freq: Two times a day (BID) | ORAL | Status: DC
Start: 2018-09-05 — End: 2018-09-05
  Filled 2018-09-05: qty 1

## 2018-09-05 MED ORDER — ALUMINUM-MAG HYDROXIDE-SIMETHICONE 200 MG-200 MG-20 MG/5 ML ORAL SUSP
20.0000 mL | ORAL | Status: DC | PRN
Start: 2018-09-05 — End: 2018-09-05

## 2018-09-05 MED ORDER — SODIUM CHLORIDE 0.9 % (FLUSH) INJECTION SYRINGE
3.0000 mL | INJECTION | Freq: Three times a day (TID) | INTRAMUSCULAR | Status: DC
Start: 2018-09-05 — End: 2018-09-05
  Administered 2018-09-05 (×2): 0 mL

## 2018-09-05 MED ORDER — METOCLOPRAMIDE 5 MG/ML INJECTION SOLUTION
10.0000 mg | Freq: Four times a day (QID) | INTRAMUSCULAR | Status: DC | PRN
Start: 2018-09-05 — End: 2018-09-05

## 2018-09-05 MED ORDER — MAGNESIUM SULFATE 500 MG/ML (50 %) INJECTION SOLUTION
INTRAMUSCULAR | Status: DC
Start: 2018-09-05 — End: 2018-09-05
  Filled 2018-09-05 (×2): qty 1000

## 2018-09-05 MED ORDER — FAMOTIDINE (PF) 20 MG/2 ML INTRAVENOUS SOLUTION
20.00 mg | Freq: Two times a day (BID) | INTRAVENOUS | Status: DC
Start: 2018-09-05 — End: 2018-09-05
  Administered 2018-09-05: 20 mg via INTRAVENOUS
  Filled 2018-09-05 (×4): qty 2

## 2018-09-05 MED ORDER — SODIUM CHLORIDE 0.9 % (FLUSH) INJECTION SYRINGE
3.0000 mL | INJECTION | INTRAMUSCULAR | Status: DC | PRN
Start: 2018-09-05 — End: 2018-09-05

## 2018-09-05 MED ORDER — ACETAMINOPHEN 325 MG TABLET
650.0000 mg | ORAL_TABLET | Freq: Four times a day (QID) | ORAL | Status: DC | PRN
Start: 2018-09-05 — End: 2018-09-05

## 2018-09-05 MED ORDER — MULTIVITAMIN,TX-MINERALS TABLET
1.0000 | ORAL_TABLET | Freq: Every day | ORAL | Status: DC
Start: 2018-09-05 — End: 2018-09-05
  Administered 2018-09-05: 1 via ORAL
  Filled 2018-09-05 (×2): qty 1

## 2018-09-05 NOTE — Pharmacy (Signed)
[facility] / [place]  Pharmacist Protocol Order: IV to PO Therapy Conversion   09/05/2018      Varelas, Pilar Plate  Date of Birth:  [birth year]    The Medical Executive Committee at [facility] has granted pharmacists the ability to change therapy from IV to PO for medications which have good oral absorption in patients who can tolerate oral therapy.  The following order has been changed. The switch to oral therapy doesn't indicate that the medical severity or intensity of the patient has decreased.     Original Order: Famotidine 20mg  IV Q12H    New Order: Famotidine 20mg  PO Q12H      Please contact the pharmacy if you have any questions.     Josephine Cables, [facility]  09/05/2018  12:19

## 2018-09-05 NOTE — Nurses Notes (Signed)
Patient declined psych/therapist consult.  Roylene Reason, RN

## 2018-09-05 NOTE — H&P (Addendum)
[name]  Hospitalist Admission H&P    [name], [name], 38 y.o. male  Date of Admission:  09/04/2018  Date of Birth:  03/18/1980    Information Obtained from: patient  Chief Complaint:  Nausea and vomiting    HPI: [name] [name] is a 38 y.o., White male who presents with 1 day duration of nausea vomiting.  Patient woke up this morning and felt diaphoretic and nauseous.  He went to the bathroom and vomited.  His vomit was clear, nonbloody, nonbilious.  He had no abdominal pain, fevers or chills.  He admits to binge drinking about 18-20 drinks over the last 3 days.  He recently moved about 3 weeks ago from [place] to get away after undergoing stressors from a recent divorce.  He also states that he recently lost his job and his house and appears depressed.  He has a history of heavy alcohol use but states that he has been not drinking for several months until 3 days ago.  He attributes this to social stressors.  He was seen in the emergency room and lab work revealed metabolic acidosis.  He was admitted for further evaluation.  He denies diarrhea, fevers or chills or recent hospitalization, antibiotic use.  He denies over-the-counter NSAID use.    Past medical history-excess alcohol use      Past Surgical History:   Procedure Laterality Date   . HX TONSIL AND ADENOIDECTOMY  1987         Medications Prior to Admission     None        No Known Allergies  Social History     Socioeconomic History   . Marital status: Single     Spouse name: Not on file   . Number of children: Not on file   . Years of education: Not on file   . Highest education level: Not on file   Tobacco Use   . Smoking status: Never Smoker   . Smokeless tobacco: Current User     Types: Chew   Substance and Sexual Activity   . Alcohol use: Yes     Alcohol/week: 15.0 standard drinks     Types: 15 Cans of beer per week     Comment: Over 3 day period    . Drug use: Yes     Types: Marijuana     Comment: Couple days ago      Past Family History:    No significant family history reported      ROS: Other than ROS in the HPI, all other systems were negative.    EXAM:  Temperature: 37.2 C (99 F)  Heart Rate: 82  BP (Non-Invasive): 133/69  Respiratory Rate: 16  SpO2: 100 %  Constitutional: cachectic  Eyes: Conjunctiva clear., Pupils equal and round. , Sclera non-icteric.   ENT: Mouth mucous membranes dry.   Neck: no thyromegaly and supple, symmetrical, trachea midline  Respiratory: Clear to auscultation bilaterally.   Cardiovascular: regular rate and rhythm  Gastrointestinal: Soft, non-tender, Bowel sounds normal, non-distended  Genitourinary: Deferred  Musculoskeletal: Head atraumatic and normocephalic and AROM  Integumentary:  Skin warm and dry, No rashes and No lesions  Neurologic: Grossly normal  Lymphatic/Immunologic/Hematologic: No lymphadenopathy  Psychiatric: Affect Depressed      LABS:    Lab Results Today:    Results for orders placed or performed during the hospital encounter of 09/04/18 (from the past 24 hour(s))   POC BLOOD GLUCOSE (RESULTS)   Result Value Ref Range  GLUCOSE, POC 76 70 - 110 mg/dl   ECG 12 LEAD - ED USE   Result Value Ref Range    Heart Rate 77 BPM    PR Interval 124 ms    QRS Duration 79 ms    QT Interval 364 ms    QTC Calculation 412 ms    Calculated P Axis 70 deg    QRS Axis 63 deg    Calculated T Axis 67 deg    I 40 Axis 77 deg    T 40 Axis 53 deg    ST Axis 68 deg    EKG Severity - NORMAL ECG -    BASIC METABOLIC PANEL   Result Value Ref Range    SODIUM 135 135 - 145 mmol/L    POTASSIUM 5.6 (H) 3.5 - 5.0 mmol/L    CHLORIDE 96 (L) 98 - 111 mmol/L    CO2 TOTAL 18 (L) 21 - 35 mmol/L    ANION GAP 21 mmol/L    CALCIUM 9.7 8.8 - 10.3 mg/dL    GLUCOSE 68 (L) 70 - 110 mg/dL    BUN 15 10 - 25 mg/dL    CREATININE 0.94 <=1.30 mg/dL    BUN/CREA RATIO 16     ESTIMATED GFR >60 Avg: 99 mL/min/1.10m2   TSH W/ FREE T4 REFLEX   Result Value Ref Range    TSH 0.272 (L) 0.450 - 5.330 uIU/mL   TROPONIN-I   Result Value Ref Range    TROPONIN I  0.01 <=0.04 ng/mL   CBC WITH DIFF   Result Value Ref Range    WBC 14.7 (H) 3.3 - 9.3 x10^3/uL    RBC 4.51 4.40 - 5.68 x10^6/uL    HGB 13.9 13.4 - 17.3 g/dL    HCT 41.4 38.9 - 50.5 %    MCV 91.7 82.4 - 95.0 fL    MCH 30.9 27.9 - 33.1 pg    MCHC 33.7 32.8 - 36.0 g/dL    RDW 13.9 10.9 - 15.1 %    PLATELETS 238 140 - 440 x10^3/uL    MPV 7.1 6.0 - 10.2 fL    NEUTROPHIL % 90 %    LYMPHOCYTE % 7 %    MONOCYTE % 3 %    EOSINOPHIL % 0 %    BASOPHIL % 0 %    NEUTROPHIL # 13.20 (H) 1.60 - 5.50 x10^3/uL    LYMPHOCYTE # 1.10 0.80 - 3.20 x10^3/uL    MONOCYTE # 0.40 0.20 - 0.80 x10^3/uL    EOSINOPHIL # 0.00 0.00 - 0.50 x10^3/uL    BASOPHIL # 0.00 0.00 - 0.20 x10^3/uL   URINALYSIS, MACRO/MICRO   Result Value Ref Range    COLOR Yellow Yellow, Straw, Colorless    APPEARANCE Cloudy Clear, Cloudy    SPECIFIC GRAVITY 1.022 1.010 - 1.025    PH 5.5 5.0 - 7.0    LEUKOCYTES Negative Negative WBCs/uL    NITRITE Negative Negative    PROTEIN Negative Negative, Trace mg/dL    GLUCOSE Negative Negative mg/dL    KETONES Large (A) Negative mg/dL    UROBILINOGEN 0.2  Normal, 0.2 , 1.0 mg/dL    BILIRUBIN Negative Negative mg/dL    BLOOD Small (A) Negative mg/dL    RBCS 0-2 (A) None /hpf    WBCS 0-5 (A) None /hpf    BACTERIA None None /hpf    SQUAMOUS EPITHELIAL None None, Rare, Occasional /hpf    HYALINE CASTS Occasional None, Rare, Few, Occasional, Many /lpf  THYROXINE, FREE (FREE T4)   Result Value Ref Range    THYROXINE (T4), FREE 0.74 0.61 - 1.12 ng/dL   ACETAMINOPHEN LEVEL   Result Value Ref Range    ACETAMINOPHEN LEVEL <10 (L) 10 - 30 ug/mL   VENOUS BLOOD GAS   Result Value Ref Range    %FIO2 (VENOUS) 21.0 %    BICARBONATE (VENOUS) 15.5 (L) 23.0 - 33.0 mmol/L    PCO2 (VENOUS) 39.00 38.00 - 50.00 mm/Hg    PH (VENOUS) 7.23 (LL) 7.32 - 7.45    PO2 (VENOUS) 35.0 30.0 - 50.0 mm/Hg    BASE DEFICIT 10.6 (H) -2.0 - 3.0 mmol/L    O2 SATURATION (VENOUS) 58.5 (L) 16.3 - 84.5 %   SALICYLATE ACID LEVEL   Result Value Ref Range    SALICYLATE LEVEL <4 <36  mg/dL   VENOUS BLOOD GAS   Result Value Ref Range    %FIO2 (VENOUS) 21.0 %    BICARBONATE (VENOUS) 15.2 (L) 23.0 - 33.0 mmol/L    PCO2 (VENOUS) 44.00 38.00 - 50.00 mm/Hg    PH (VENOUS) 7.21 (LL) 7.32 - 7.45    PO2 (VENOUS) 23.0 (L) 30.0 - 50.0 mm/Hg    BASE DEFICIT 10.0 (H) -2.0 - 3.0 mmol/L    O2 SATURATION (VENOUS) 35.8 (L) 95.0 - 98.0 %   BASIC METABOLIC PANEL   Result Value Ref Range    SODIUM 135 135 - 145 mmol/L    POTASSIUM 5.0 3.5 - 5.0 mmol/L    CHLORIDE 102 98 - 111 mmol/L    CO2 TOTAL 17 (L) 21 - 35 mmol/L    ANION GAP 16 mmol/L    CALCIUM 8.0 (L) 8.8 - 10.3 mg/dL    GLUCOSE 64 (L) 70 - 110 mg/dL    BUN 13 10 - 25 mg/dL    CREATININE 0.84 <=1.30 mg/dL    BUN/CREA RATIO 15     ESTIMATED GFR >60 Avg: 99 mL/min/1.76m2   HEPATIC FUNCTION PANEL   Result Value Ref Range    ALBUMIN 4.2 3.2 - 4.6 g/dL    ALKALINE PHOSPHATASE 58 20 - 130 U/L    ALT (SGPT) 8 <=52 U/L    AST (SGOT) 19 <=35 U/L    BILIRUBIN TOTAL 1.0 0.3 - 1.2 mg/dL    BILIRUBIN DIRECT 0.2 <=0.3 mg/dL    PROTEIN TOTAL 5.5 (L) 6.0 - 8.3 g/dL   ETHANOL, SERUM   Result Value Ref Range    ETHANOL <10 0 - 10 mg/dL       Radiology Results:   Results for orders placed or performed during the hospital encounter of 09/04/18 (from the past 24 hour(s))   BASIC METABOLIC PANEL   Result Value Ref Range    SODIUM 135 135 - 145 mmol/L    POTASSIUM 5.6 (H) 3.5 - 5.0 mmol/L    CHLORIDE 96 (L) 98 - 111 mmol/L    CO2 TOTAL 18 (L) 21 - 35 mmol/L    ANION GAP 21 mmol/L    CALCIUM 9.7 8.8 - 10.3 mg/dL    GLUCOSE 68 (L) 70 - 110 mg/dL    BUN 15 10 - 25 mg/dL    CREATININE 0.94 <=1.30 mg/dL    BUN/CREA RATIO 16     ESTIMATED GFR >60 Avg: 99 mL/min/1.744m    Narrative    Estimated Glomerular Filtration Rate (eGFR) calculated using the CKD-EPI (2009) equation, intended for patients 185ears of age and older. If race and/or gender is not documented or "  unknown," there will be no eGFR calculation.   CBC/DIFF    Narrative    The following orders were created for panel order  CBC/DIFF.  Procedure                               Abnormality         Status                     ---------                               -----------         ------                     CBC WITH JWJX[[id number]]                Abnormal            Final result                 Please view results for these tests on the individual orders.   TSH W/ FREE T4 REFLEX   Result Value Ref Range    TSH 0.272 (L) 0.450 - 5.330 uIU/mL   TROPONIN-I   Result Value Ref Range    TROPONIN I 0.01 <=0.04 ng/mL   URINALYSIS WITH REFLEX MICROSCOPIC AND CULTURE IF POSITIVE    Narrative    The following orders were created for panel order URINALYSIS WITH REFLEX MICROSCOPIC AND CULTURE IF POSITIVE.  Procedure                               Abnormality         Status                     ---------                               -----------         ------                     URINALYSIS, MACRO/MICRO[[id number]]      Abnormal            Final result                 Please view results for these tests on the individual orders.   CBC WITH DIFF   Result Value Ref Range    WBC 14.7 (H) 3.3 - 9.3 x10^3/uL    RBC 4.51 4.40 - 5.68 x10^6/uL    HGB 13.9 13.4 - 17.3 g/dL    HCT 41.4 38.9 - 50.5 %    MCV 91.7 82.4 - 95.0 fL    MCH 30.9 27.9 - 33.1 pg    MCHC 33.7 32.8 - 36.0 g/dL    RDW 13.9 10.9 - 15.1 %    PLATELETS 238 140 - 440 x10^3/uL    MPV 7.1 6.0 - 10.2 fL    NEUTROPHIL % 90 %    LYMPHOCYTE % 7 %    MONOCYTE % 3 %    EOSINOPHIL % 0 %    BASOPHIL % 0 %    NEUTROPHIL # 13.20 (H) 1.60 - 5.50 x10^3/uL  LYMPHOCYTE # 1.10 0.80 - 3.20 x10^3/uL    MONOCYTE # 0.40 0.20 - 0.80 x10^3/uL    EOSINOPHIL # 0.00 0.00 - 0.50 x10^3/uL    BASOPHIL # 0.00 0.00 - 0.20 x10^3/uL   URINALYSIS, MACRO/MICRO   Result Value Ref Range    COLOR Yellow Yellow, Straw, Colorless    APPEARANCE Cloudy Clear, Cloudy    SPECIFIC GRAVITY 1.022 1.010 - 1.025    PH 5.5 5.0 - 7.0    LEUKOCYTES Negative Negative WBCs/uL    NITRITE Negative Negative    PROTEIN Negative Negative, Trace mg/dL     GLUCOSE Negative Negative mg/dL    KETONES Large (A) Negative mg/dL    UROBILINOGEN 0.2  Normal, 0.2 , 1.0 mg/dL    BILIRUBIN Negative Negative mg/dL    BLOOD Small (A) Negative mg/dL    RBCS 0-2 (A) None /hpf    WBCS 0-5 (A) None /hpf    BACTERIA None None /hpf    SQUAMOUS EPITHELIAL None None, Rare, Occasional /hpf    HYALINE CASTS Occasional None, Rare, Few, Occasional, Many /lpf   VENOUS BLOOD GAS   Result Value Ref Range    %FIO2 (VENOUS) 21.0 %    BICARBONATE (VENOUS) 15.5 (L) 23.0 - 33.0 mmol/L    PCO2 (VENOUS) 39.00 38.00 - 50.00 mm/Hg    PH (VENOUS) 7.23 (LL) 7.32 - 7.45    PO2 (VENOUS) 35.0 30.0 - 50.0 mm/Hg    BASE DEFICIT 10.6 (H) -2.0 - 3.0 mmol/L    O2 SATURATION (VENOUS) 58.5 (L) 95.0 - 98.0 %   THYROXINE, FREE (FREE T4)   Result Value Ref Range    THYROXINE (T4), FREE 0.74 0.61 - 1.12 ng/dL   ACETAMINOPHEN LEVEL   Result Value Ref Range    ACETAMINOPHEN LEVEL <10 (L) 10 - 30 ug/mL   SALICYLATE ACID LEVEL   Result Value Ref Range    SALICYLATE LEVEL <4 <44 mg/dL   VENOUS BLOOD GAS   Result Value Ref Range    %FIO2 (VENOUS) 21.0 %    BICARBONATE (VENOUS) 15.2 (L) 23.0 - 33.0 mmol/L    PCO2 (VENOUS) 44.00 38.00 - 50.00 mm/Hg    PH (VENOUS) 7.21 (LL) 7.32 - 7.45    PO2 (VENOUS) 23.0 (L) 30.0 - 50.0 mm/Hg    BASE DEFICIT 10.0 (H) -2.0 - 3.0 mmol/L    O2 SATURATION (VENOUS) 35.8 (L) 95.0 - 98.0 %   BASIC METABOLIC PANEL   Result Value Ref Range    SODIUM 135 135 - 145 mmol/L    POTASSIUM 5.0 3.5 - 5.0 mmol/L    CHLORIDE 102 98 - 111 mmol/L    CO2 TOTAL 17 (L) 21 - 35 mmol/L    ANION GAP 16 mmol/L    CALCIUM 8.0 (L) 8.8 - 10.3 mg/dL    GLUCOSE 64 (L) 70 - 110 mg/dL    BUN 13 10 - 25 mg/dL    CREATININE 0.84 <=1.30 mg/dL    BUN/CREA RATIO 15     ESTIMATED GFR >60 Avg: 99 mL/min/1.35m2    Narrative    Estimated Glomerular Filtration Rate (eGFR) calculated using the CKD-EPI (2009) equation, intended for patients 160years of age and older. If race and/or gender is not documented or "unknown," there will be no  eGFR calculation.   HEPATIC FUNCTION PANEL   Result Value Ref Range    ALBUMIN 4.2 3.2 - 4.6 g/dL    ALKALINE PHOSPHATASE 58 20 - 130 U/L    ALT (  SGPT) 8 <=52 U/L    AST (SGOT) 19 <=35 U/L    BILIRUBIN TOTAL 1.0 0.3 - 1.2 mg/dL    BILIRUBIN DIRECT 0.2 <=0.3 mg/dL    PROTEIN TOTAL 5.5 (L) 6.0 - 8.3 g/dL   ETHANOL, SERUM   Result Value Ref Range    ETHANOL <10 0 - 10 mg/dL   POC BLOOD GLUCOSE (RESULTS)   Result Value Ref Range    GLUCOSE, POC 76 70 - 110 mg/dl           There is no immunization history on file for this patient.    DNR Status:  No Order      ASSESSMENT:    Active Hospital Problems    Diagnosis   . Primary Problem: Alcoholic ketoacidosis   . Hypoglycemia   . Nausea and vomiting     DVT/PE Prophylaxis: Heparin  Disposition Planning: Home discharge     PLAN:    1. 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  Re-evaluate labs after 12 hours.  -regular diet  -has no history of alcohol withdrawal, low concern for alcohol dependence    2. Depression; acute adjustment disorder  -refer for outpatient counseling    3. Vomiting-use PPI and Phenergan.    4. Hyperkalemia  -likely due to acidosis  -check a.m. cortisol rule out hypoaldosteronism    Dorathy Kinsman, MD

## 2018-09-05 NOTE — Ancillary Notes (Signed)
Medical Nutrition Therapy Note    Name: [name] [name]   Date of Service: 09/05/2018  MRN: [id number]  Length of Stay: 0 days  Admit date: 09/04/2018    Reason for Note: Nursing Nutritional Risk notification:  weight loss in the last 3 months or less and BMI < 20 for an Adult patient    Diet order: MNT PROTOCOL FOR DIETITIAN  DIET REGULAR  DIETARY ORAL SUPPLEMENTS Supplement: Ensure Plus Strawberry; BREAKFAST/LUNCH/DINNER; 1 Can      I: Visited with [name] [name] this am.  He reports a poor appetite x 2 weeks.  He states that his weight was up to 130 lbs back in March/April, but that when his appetite declines due to stress/anxiety, he loses weight quickly.  He states he had been trying to gain weight by making himself eat 3 meals per day, but he often skips meals.  He will occasionally drink Ensure and states he had just bought a case of Ensure but he left it in his very hot car and is now afraid to drink it.  He reports increased amounts of stress over the last few months.  He states he drinks a lot of water, he does not take vitamins, he denies issues with chewing or swallowing and denies problems with constipation.     Height: 172.7 cm (5\' 8" )  Weight: 50.6 kg (111 lb 9.6 oz)  Body mass index is 16.97 kg/m.   UBW: 59.1 kg   %UBW:  86 %   Weight change: has decreased 8.5 kg or 14 % in  2-3 months     Wt Readings from Last 5 Encounters:   09/05/18 50.6 kg (111 lb 9.6 oz)     No noted edema, No noted wounds per nursing records.     Labs/meds reviewed    P:   1. Ordered Ensure strawberry TID with meals  2. Agree with banana bag; order oral vitamins when appropriate: folate 1000 mcg, daily multivitamin, 100 mgs thiamine per day  3. Consider checking current iron panel and supplement prn  4. Agree with Regular diet; encourage PO intake  5. Discussed ways to increase calorie intake and encourage weight gain  Will continue to monitor per screening protocol.      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      Ranelle Oyster, RDLD  09/05/2018  09:47  [facility] Clinical Dietitian  Pager # [phone]

## 2018-09-05 NOTE — Discharge Summary (Addendum)
DISCHARGE SUMMARY      PATIENT NAME:  [name] [name], [name] [name]  MRN:  [id number]  DOB:  1981-02-21    ADMISSION DATE:  09/04/2018  DISCHARGE DATE:  09/05/2018    ATTENDING PHYSICIAN:Wray, Garnette Scheuermann, DO  SERVICE: [facility] HOSPITALIST 5  PRIMARY CARE PHYSICIAN: No Pcp     Reason for Admission     Diagnosis    Alcoholic ketoacidosis [13086]          DISCHARGE DIAGNOSIS:     Principal Problem:  Alcoholic ketoacidosis    Active Hospital Problems    Diagnosis Date Noted   . Principle Problem: Alcoholic ketoacidosis [V78.4] 09/05/2018   . Hypoglycemia [E16.2] 09/05/2018   . Nausea and vomiting [R11.2] 09/05/2018      Resolved Hospital Problems   No resolved problems to display.     There are no active non-hospital problems to display for this patient.     No Known Allergies     INVESTIGATIONS:  Labs:  CBC  (Last 24 hours)    Date/Time WBC HGB HCT MCV PLATELETS    09/04/18 1907 14.7 (H)    13.9    41.4    91.7    238           BMP  (Last 24 hours)    Date/Time Na K Cl CO2 BUN CREAT Calcium Glucose    09/05/18 1609 135    4.1    102    27    10    0.70    8.7 (L)    169 (H)       09/04/18 2237 135    5.0    102    17 (L)    13    0.84    8.0 (L)    64 (L)       09/04/18 1907 135    5.6 (H)    96 (L)    18 (L)    15    0.94    9.7    68 (L)         Hepatic Function  (Last 24 hours)    Date/Time Albumin Total PTN Total Bili Direct Bili AST/SGOT ALT/SGPT Alk Phos    09/04/18 2237 4.2    5.5 (L)    1.0    0._0 58           Recent Labs     09/05/18  1609   MAGNESIUM 2.0     No results for input(s): INR, APTT in the last 24 hours.  TROPONIN I   Date Value Ref Range Status   09/04/2018 0.01 <=0.04 ng/mL Final         Imaging:  Results for orders placed or performed during the hospital encounter of 09/04/18 (from the past 24 hour(s))   XR AP MOBILE CHEST     Status: None    Narrative    Lamoine Truszkowski    PROCEDURE DESCRIPTION: XR AP MOBILE CHEST    CLINICAL INDICATION: nausea    TECHNIQUE: 1 views / 1 images submitted.    Heart size is  normal. There is no focal consolidation or pulmonary edema.  No evidence of pleural effusion or pneumothorax.      Impression    No acute cardiopulmonary abnormality.           Radiologist location ID: [id number]  Status: None    Narrative    Aaran Shake    PROCEDURE DESCRIPTION: CT ABDOMEN PELVIS W IV CONTRAST    CLINICAL INDICATION: nausea and vomiting    No priors available for comparison.    FINDINGS: There is no evidence of pneumonia the visualized lung bases. A  few small pleural-based nodules at the bilateral bases measure up to  approximately 5 mm.    Fatty liver is noted. Gallbladder is mildly distended without evidence of  cholecystitis. The spleen is normal. No evidence of pancreatitis.  Pancreatic duct is nondistended.    Adrenal glands are normal. Renal cortical thickness and enhancement are  normal. There is no hydronephrosis. No urinary tract calculus is detected.  Bladder is mildly distended.    Large and small bowel loops are of normal caliber. Stomach is mildly  distended. There is mild submucosal fat deposition accounting for wall  thickening of the ascending colon. The appendix is of normal size.    There is no free air or evidence of intra-abdominal abscess.    The portal vein is patent. Abdominal aorta is of normal size.      Impression    1. No acute abnormality within the abdomen or pelvis.  2. Mild fatty liver.  3. Small bilateral lung base nodules. Consider dedicated chest CT to  establish a baseline for follow-up.          The CT exam was performed using one or more the following a dose reduction  techniques: Automated exposure control, adjustment of the mA and/or kV  according to the patient's size, or use of iterative reconstruction  technique.        Radiologist location ID: [id number]           DISCHARGE MEDICATIONS:     Current Discharge Medication List      You have not been prescribed any medications.       Discharge med list refreshed?   YES        DISCHARGE INSTRUCTIONS:  Follow-up Information     Pcp, No.           Schedule an appointment as soon as possible for a visit with Newell Coral, MD.    Specialty:  PSYCHIATRY  Contact information:  [place] [id number]  [phone]                    DISCHARGE INSTRUCTION - DIET     Diet: RESUME HOME DIET      DISCHARGE INSTRUCTION - ACTIVITY     Activity: AS TOLERATED           [place] COURSE:  This is a 38 y.o., male with no prior medical history.  Presented to emergency department with nausea and vomiting.  Admits to binge drinking 18-20 drinks over the last 3 days.  Lab work emergency department revealed metabolic acidosis and hyperkalemia.  Denied fever, diarrhea, chills or over-the-counter NSAID use.  Urine pH was not suggestive of renal tubular acidosis, patient was referred for admission for likely alcoholic ketoacidosis and treated with lactated Ringer's.  Repeat labs this afternoon were within normal limits and patient is feeling better.  Will be discharged home with referral for outpatient psychiatry referral.  Was offered inpatient psychiatric consult but patient declined.    PHYSICAL EXAM: Nursing note and vitals reviewed.   Constitutional: Patient is in no acute distress.    Lungs: Clear to  auscultation bilaterally without wheezes, rales or rhonchi.  Cardiovascular: Regular rate and rhythm. No murmur, rub, or gallop. Pulses strong and equal bilaterally.  Abdomen: Normal bowel sounds. Soft, nontender, nondistended.   MSK/Extremities: Moves all extremities.   Skin: No lesions noted.  No rashes.  Neuro: CNs 2-12 grossly intact. No facial droop noted.    Psych: Alert andoriented to person, place, and time. Normal affect      CONDITION ON DISCHARGE: Improved and stable.      DISCHARGE DISPOSITION:  Home discharge              Jonah Blue, FNP-BC  09/05/2018, 16:58    Copies sent to Care Team       Relationship Specialty  Notifications Start End    Pcp, No PCP - General   09/04/18           Referring providers can utilize [email or web address] access their referred [facility] patient's information.

## 2018-09-05 NOTE — Nurses Notes (Signed)
Pt discharged to home. Patient given discharge instructions, information on new medications and informed of follow up appointments. IV removed, cath tip intact. Pt denies any pain at this time. Patient to leave floor via wheelchair. Kynnedi Zweig, RN

## 2018-09-05 NOTE — Nurses Notes (Signed)
I have reviewed this patient's orders and plan of care.  Currently this patient meets requirements for a low to mid level of nursing care.  The patient will continue to be monitored and re-evaluated for any changes. Kamaiyah Uselton, RN

## 2018-09-05 NOTE — Progress Notes (Signed)
[facility]  Nutrition Note    Patient Name: [name] [name]  MRN: [id number] / CSN: [id number]  Age: 38 y.o.  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      Recommendations:  1. Ordered Ensure strawberry TID with meals  2. Agree with banana bag; order oral vitamins when appropriate: folate 1000 mcg, daily multivitamin, 100 mgs thiamine per day  3. Consider checking current iron panel and supplement prn  4. Agree with Regular diet; encourage PO intake  5. Discussed ways to increase calorie intake and encourage weight gain  Will continue to monitor per screening protocol.      Harrie Jeans, RD, LD 09/05/2018, 09:48  [facility] Clinical Dietitian  Phone X [phone]  Pager [phone]

## 2018-09-05 NOTE — Discharge Instructions (Signed)
Remember to keep all follow up appointments.  Take medications as prescribed.  Return for any new or worsening symptoms

## 2018-09-05 NOTE — Care Plan (Signed)
Pt is alert & oriented, able to make needs known. Free from falls this shift. IV fluids were started and tolerated well. Pt ate, no nausea or vomiting. Rested well with no complaints noted. Will continue to monitor. Barton Fanny, LPN

## 2018-09-07 LAB — URINE CULTURE,ROUTINE: URINE CULTURE: NO GROWTH

## 2018-09-20 ENCOUNTER — Encounter (HOSPITAL_COMMUNITY): Payer: Self-pay

## 2018-09-20 ENCOUNTER — Inpatient Hospital Stay
Admission: RE | Admit: 2018-09-20 | Discharge: 2018-09-29 | DRG: 896 | Disposition: A | Discharge status: Home / Self Care | Payer: No Typology Code available for payment source | Attending: PSYCHIATRY & NEUROLOGY, PSYCHIATRY | Admitting: PSYCHIATRY & NEUROLOGY, PSYCHIATRY

## 2018-09-20 ENCOUNTER — Inpatient Hospital Stay (HOSPITAL_COMMUNITY): Payer: MEDICAID | Admitting: PSYCHIATRY & NEUROLOGY, PSYCHIATRY

## 2018-09-20 ENCOUNTER — Other Ambulatory Visit: Payer: Self-pay

## 2018-09-20 DIAGNOSIS — Z681 Body mass index (BMI) 19 or less, adult: Secondary | ICD-10-CM

## 2018-09-20 DIAGNOSIS — Z20828 Contact with and (suspected) exposure to other viral communicable diseases: Secondary | ICD-10-CM | POA: Diagnosis present

## 2018-09-20 DIAGNOSIS — F4310 Post-traumatic stress disorder, unspecified: Secondary | ICD-10-CM | POA: Diagnosis present

## 2018-09-20 DIAGNOSIS — F17220 Nicotine dependence, chewing tobacco, uncomplicated: Secondary | ICD-10-CM | POA: Diagnosis present

## 2018-09-20 DIAGNOSIS — E43 Unspecified severe protein-calorie malnutrition: Secondary | ICD-10-CM | POA: Diagnosis present

## 2018-09-20 DIAGNOSIS — F1910 Other psychoactive substance abuse, uncomplicated: Secondary | ICD-10-CM | POA: Diagnosis present

## 2018-09-20 DIAGNOSIS — I4510 Unspecified right bundle-branch block: Secondary | ICD-10-CM

## 2018-09-20 DIAGNOSIS — F419 Anxiety disorder, unspecified: Secondary | ICD-10-CM

## 2018-09-20 DIAGNOSIS — F1010 Alcohol abuse, uncomplicated: Principal | ICD-10-CM | POA: Diagnosis present

## 2018-09-20 DIAGNOSIS — R45851 Suicidal ideations: Secondary | ICD-10-CM

## 2018-09-20 DIAGNOSIS — F332 Major depressive disorder, recurrent severe without psychotic features: Secondary | ICD-10-CM | POA: Diagnosis present

## 2018-09-20 DIAGNOSIS — Y908 Blood alcohol level of 240 mg/100 ml or more: Secondary | ICD-10-CM | POA: Diagnosis present

## 2018-09-20 LAB — URINALYSIS, MACRO/MICRO
BILIRUBIN: NEGATIVE mg/dL
GLUCOSE: NEGATIVE mg/dL
KETONES: NEGATIVE mg/dL
LEUKOCYTES: NEGATIVE WBCs/uL
NITRITE: NEGATIVE
PH: 5.5 (ref 5.0–7.0)
PROTEIN: NEGATIVE mg/dL
SPECIFIC GRAVITY: 1.009 — ABNORMAL LOW (ref 1.010–1.025)
UROBILINOGEN: 0.2 mg/dL

## 2018-09-20 LAB — CBC WITH DIFF
BASOPHIL #: 0 10*3/uL (ref 0.00–0.20)
BASOPHIL %: 1 %
EOSINOPHIL #: 0 10*3/uL (ref 0.00–0.50)
EOSINOPHIL %: 0 %
HCT: 38 % — ABNORMAL LOW (ref 38.9–50.5)
HGB: 13.2 g/dL — ABNORMAL LOW (ref 13.4–17.3)
LYMPHOCYTE #: 2.1 10*3/uL (ref 0.80–3.20)
LYMPHOCYTE %: 37 %
MCH: 31.7 pg (ref 27.9–33.1)
MCHC: 34.7 g/dL (ref 32.8–36.0)
MCV: 91.2 fL (ref 82.4–95.0)
MONOCYTE #: 0.3 10*3/uL (ref 0.20–0.80)
MONOCYTE %: 6 %
MPV: 7.1 fL (ref 6.0–10.2)
NEUTROPHIL #: 3.2 10*3/uL (ref 1.60–5.50)
NEUTROPHIL %: 56 %
PLATELETS: 314 10*3/uL (ref 140–440)
RBC: 4.17 10*6/uL — ABNORMAL LOW (ref 4.40–5.68)
RDW: 14.1 % (ref 10.9–15.1)
WBC: 5.7 10*3/uL (ref 3.3–9.3)

## 2018-09-20 LAB — HEPATIC FUNCTION PANEL
ALBUMIN: 4.7 g/dL — ABNORMAL HIGH (ref 3.2–4.6)
ALKALINE PHOSPHATASE: 61 U/L (ref 20–130)
ALT (SGPT): 9 U/L (ref <=52)
AST (SGOT): 15 U/L (ref <=35)
BILIRUBIN DIRECT: 0.1 mg/dL (ref <=0.3)
BILIRUBIN TOTAL: 0.6 mg/dL (ref 0.3–1.2)
PROTEIN TOTAL: 6.7 g/dL (ref 6.0–8.3)

## 2018-09-20 LAB — BASIC METABOLIC PANEL
ANION GAP: 15 mmol/L
BUN/CREA RATIO: 8
BUN: 6 mg/dL — ABNORMAL LOW (ref 10–25)
CALCIUM: 9.1 mg/dL (ref 8.8–10.3)
CHLORIDE: 103 mmol/L (ref 98–111)
CO2 TOTAL: 26 mmol/L (ref 21–35)
CREATININE: 0.78 mg/dL (ref <=1.30)
ESTIMATED GFR: >60 mL/min/{1.73_m2}
GLUCOSE: 104 mg/dL (ref 70–110)
POTASSIUM: 3.8 mmol/L (ref 3.5–5.0)
SODIUM: 144 mmol/L (ref 135–145)

## 2018-09-20 LAB — URINE DRUG SCREEN
AMPHETAMINES URINE: NEGATIVE
BARBITURATES URINE: NEGATIVE
BENZODIAZEPINES URINE: NEGATIVE
BUPRENORPHINE URINE: NEGATIVE
CANNABINOIDS URINE: NEGATIVE
COCAINE METABOLITES URINE: NEGATIVE
OPIATES URINE: NEGATIVE
PCP URINE: NEGATIVE

## 2018-09-20 LAB — ETHANOL, SERUM/PLASMA: ETHANOL: 275 mg/dL — ABNORMAL HIGH (ref 0–10)

## 2018-09-20 LAB — THYROID STIMULATING HORMONE WITH FREE T4 REFLEX: TSH: 1.076 u[IU]/mL (ref 0.450–5.330)

## 2018-09-20 MED ORDER — LORAZEPAM 1 MG TABLET
2.00 mg | ORAL_TABLET | ORAL | Status: AC
Start: 2018-09-20 — End: 2018-09-20
  Administered 2018-09-20: 2 mg via ORAL
  Filled 2018-09-20: qty 2

## 2018-09-20 MED ORDER — THIAMINE HCL (VITAMIN B1) 100 MG/ML INJECTION SOLUTION
INTRAMUSCULAR | Status: AC
Start: 2018-09-20 — End: 2018-09-21
  Administered 2018-09-21: 02:00:00 0 via INTRAVENOUS
  Filled 2018-09-20: qty 1000

## 2018-09-20 MED ORDER — SODIUM CHLORIDE 0.9 % IV BOLUS
1000.0000 mL | INJECTION | Status: DC
Start: 2018-09-20 — End: 2018-09-20
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
## 2018-09-20 NOTE — ED Nurses Note (Signed)
Pt has c/c of anxiety. Patient denies SI/HI and is calm and cooperative. Pt states he "is not from around here" and has been experiencing a panic-like feeling for the last few days. Patient oriented to room and given call bell. No acute distress noted. Katherene Ponto, RN  09/20/2018, 19:14

## 2018-09-20 NOTE — ED Triage Notes (Signed)
Pt ambulated in with EMS c/o Anxiety

## 2018-09-21 DIAGNOSIS — F1910 Other psychoactive substance abuse, uncomplicated: Secondary | ICD-10-CM | POA: Diagnosis present

## 2018-09-21 DIAGNOSIS — F332 Major depressive disorder, recurrent severe without psychotic features: Secondary | ICD-10-CM

## 2018-09-21 DIAGNOSIS — F17220 Nicotine dependence, chewing tobacco, uncomplicated: Secondary | ICD-10-CM

## 2018-09-21 DIAGNOSIS — F1020 Alcohol dependence, uncomplicated: Secondary | ICD-10-CM

## 2018-09-21 LAB — ETHANOL, SERUM/PLASMA
ETHANOL: 174 mg/dL — ABNORMAL HIGH (ref 0–10)
ETHANOL: 114 mg/dL — ABNORMAL HIGH (ref 0–10)
ETHANOL: 92 mg/dL — ABNORMAL HIGH (ref 0–10)

## 2018-09-21 LAB — ECG 12 LEAD - ED USE
Calculated P Axis: 74 deg
Calculated T Axis: 67 deg
Heart Rate: 83 {beats}/min
I 40 Axis: 63 deg
PR Interval: 133 ms
QRS Axis: 67 deg
QRS Duration: 76 ms
QT Interval: 370 ms
QTC Calculation: 435 ms
ST Axis: 73 deg
T 40 Axis: 66 deg

## 2018-09-21 LAB — COVID-19 ~~LOC~~ MOLECULAR LAB TESTING: SARS-CoV-2: NOT DETECTED

## 2018-09-21 MED ORDER — OLANZAPINE 5 MG DISINTEGRATING TABLET
5.0000 mg | ORAL_TABLET | ORAL | Status: DC | PRN
Start: 2018-09-21 — End: 2018-09-29

## 2018-09-21 MED ORDER — DIAZEPAM 5 MG TABLET
5.0000 mg | ORAL_TABLET | Freq: Four times a day (QID) | ORAL | Status: AC
Start: 2018-09-23 — End: 2018-09-24
  Administered 2018-09-23 – 2018-09-24 (×4): 5 mg via ORAL
  Filled 2018-09-21 (×4): qty 1

## 2018-09-21 MED ORDER — SODIUM CHLORIDE 0.9 % IV BOLUS
1000.0000 mL | INJECTION | Status: AC
Start: 2018-09-21 — End: 2018-09-21
  Administered 2018-09-21: 03:00:00 0 mL via INTRAVENOUS
  Administered 2018-09-21: 02:00:00 1000 mL via INTRAVENOUS

## 2018-09-21 MED ORDER — DIAZEPAM 5 MG TABLET
5.0000 mg | ORAL_TABLET | Freq: Two times a day (BID) | ORAL | Status: AC
Start: 2018-09-25 — End: 2018-09-26
  Administered 2018-09-25 – 2018-09-26 (×2): 5 mg via ORAL
  Filled 2018-09-21 (×2): qty 1

## 2018-09-21 MED ORDER — FOLIC ACID 1 MG TABLET
1.0000 mg | ORAL_TABLET | Freq: Every day | ORAL | Status: DC
Start: 2018-09-21 — End: 2018-09-29
  Administered 2018-09-21 – 2018-09-29 (×9): 1 mg via ORAL
  Filled 2018-09-21 (×10): qty 1

## 2018-09-21 MED ORDER — DIAZEPAM 5 MG TABLET
5.0000 mg | ORAL_TABLET | Freq: Every evening | ORAL | Status: AC
Start: 2018-09-26 — End: 2018-09-26
  Administered 2018-09-26: 5 mg via ORAL
  Filled 2018-09-21: qty 1

## 2018-09-21 MED ORDER — LORAZEPAM 2 MG/ML INJECTION SOLUTION
2.0000 mg | INTRAMUSCULAR | Status: DC | PRN
Start: 2018-09-21 — End: 2018-09-29

## 2018-09-21 MED ORDER — HALOPERIDOL LACTATE 5 MG/ML INJECTION SOLUTION
5.0000 mg | Freq: Three times a day (TID) | INTRAMUSCULAR | Status: DC | PRN
Start: 2018-09-21 — End: 2018-09-29

## 2018-09-21 MED ORDER — NICOTINE (POLACRILEX) 2 MG GUM
4.0000 mg | CHEWING_GUM | BUCCAL | Status: DC | PRN
Start: 2018-09-21 — End: 2018-09-29
  Administered 2018-09-21 – 2018-09-29 (×11): 4 mg via ORAL
  Filled 2018-09-21 (×2): qty 4
  Filled 2018-09-21 (×8): qty 2

## 2018-09-21 MED ORDER — LORAZEPAM 2 MG TABLET
2.0000 mg | ORAL_TABLET | Freq: Three times a day (TID) | ORAL | Status: DC | PRN
Start: 2018-09-21 — End: 2018-09-29
  Administered 2018-09-27: 17:00:00 2 mg via ORAL

## 2018-09-21 MED ORDER — IBUPROFEN 600 MG TABLET
600.0000 mg | ORAL_TABLET | Freq: Four times a day (QID) | ORAL | Status: DC | PRN
Start: 2018-09-21 — End: 2018-09-29

## 2018-09-21 MED ORDER — ACETAMINOPHEN 325 MG TABLET
650.0000 mg | ORAL_TABLET | ORAL | Status: DC | PRN
Start: 2018-09-21 — End: 2018-09-29

## 2018-09-21 MED ORDER — HALOPERIDOL 5 MG TABLET
5.0000 mg | ORAL_TABLET | Freq: Three times a day (TID) | ORAL | Status: DC | PRN
Start: 2018-09-21 — End: 2018-09-29
  Administered 2018-09-27: 5 mg via ORAL
  Filled 2018-09-21: qty 1

## 2018-09-21 MED ORDER — NICOTINE 21 MG/24 HR DAILY TRANSDERMAL PATCH
21.0000 mg | MEDICATED_PATCH | Freq: Every day | TRANSDERMAL | Status: DC
Start: 2018-09-21 — End: 2018-09-29
  Administered 2018-09-21 – 2018-09-22 (×2): 0 mg via TRANSDERMAL
  Administered 2018-09-23: 21:00:00 via TRANSDERMAL
  Administered 2018-09-23 – 2018-09-24 (×2): 21 mg via TRANSDERMAL
  Administered 2018-09-24: 22:00:00 via TRANSDERMAL
  Administered 2018-09-25: 21 mg via TRANSDERMAL
  Administered 2018-09-25: 21:00:00 via TRANSDERMAL
  Administered 2018-09-26: 21 mg via TRANSDERMAL
  Administered 2018-09-26: 20:00:00 via TRANSDERMAL
  Administered 2018-09-27: 21 mg via TRANSDERMAL
  Administered 2018-09-27: 21:00:00 via TRANSDERMAL
  Administered 2018-09-28 – 2018-09-29 (×2): 21 mg via TRANSDERMAL
  Filled 2018-09-21 (×10): qty 1

## 2018-09-21 MED ORDER — OLANZAPINE 5 MG TABLET
5.00 mg | ORAL_TABLET | Freq: Every evening | ORAL | Status: DC
Start: 2018-09-21 — End: 2018-09-29
  Administered 2018-09-21 – 2018-09-28 (×8): 5 mg via ORAL
  Filled 2018-09-21 (×11): qty 1

## 2018-09-21 MED ORDER — DIPHENHYDRAMINE 50 MG/ML INJECTION SOLUTION
50.0000 mg | Freq: Three times a day (TID) | INTRAMUSCULAR | Status: DC | PRN
Start: 2018-09-21 — End: 2018-09-29

## 2018-09-21 MED ORDER — MULTIVITAMIN,TX-MINERALS TABLET
1.0000 | ORAL_TABLET | Freq: Every day | ORAL | Status: DC
Start: 2018-09-21 — End: 2018-09-29
  Administered 2018-09-21 – 2018-09-29 (×9): 1 via ORAL
  Filled 2018-09-21 (×10): qty 1

## 2018-09-21 MED ORDER — DIPHENHYDRAMINE 50 MG CAPSULE
50.0000 mg | ORAL_CAPSULE | Freq: Three times a day (TID) | ORAL | Status: DC | PRN
Start: 2018-09-21 — End: 2018-09-29
  Administered 2018-09-27: 50 mg via ORAL
  Filled 2018-09-21: qty 1

## 2018-09-21 MED ORDER — QUETIAPINE 25 MG TABLET
50.0000 mg | ORAL_TABLET | Freq: Every evening | ORAL | Status: DC | PRN
Start: 2018-09-21 — End: 2018-09-29

## 2018-09-21 MED ORDER — LORAZEPAM 2 MG TABLET
2.0000 mg | ORAL_TABLET | ORAL | Status: DC | PRN
Start: 2018-09-21 — End: 2018-09-29
  Administered 2018-09-23 – 2018-09-26 (×5): 2 mg via ORAL
  Filled 2018-09-21 (×6): qty 1

## 2018-09-21 MED ORDER — DIAZEPAM 10 MG TABLET
10.0000 mg | ORAL_TABLET | Freq: Three times a day (TID) | ORAL | Status: AC
Start: 2018-09-22 — End: 2018-09-23
  Administered 2018-09-22 – 2018-09-23 (×3): 10 mg via ORAL
  Filled 2018-09-21 (×3): qty 1

## 2018-09-21 MED ORDER — LOPERAMIDE 2 MG CAPSULE
4.0000 mg | ORAL_CAPSULE | Freq: Once | ORAL | Status: DC | PRN
Start: 2018-09-21 — End: 2018-09-29

## 2018-09-21 MED ORDER — THIAMINE HCL (VITAMIN B1) 100 MG TABLET
100.0000 mg | ORAL_TABLET | Freq: Every day | ORAL | Status: DC
Start: 2018-09-21 — End: 2018-09-29
  Administered 2018-09-21 – 2018-09-29 (×9): 100 mg via ORAL
  Filled 2018-09-21 (×11): qty 1

## 2018-09-21 MED ORDER — MAGNESIUM HYDROXIDE 400 MG/5 ML ORAL SUSPENSION
30.0000 mL | ORAL | Status: DC | PRN
Start: 2018-09-21 — End: 2018-09-29

## 2018-09-21 MED ORDER — DIAZEPAM 10 MG TABLET
10.0000 mg | ORAL_TABLET | Freq: Four times a day (QID) | ORAL | Status: AC
Start: 2018-09-21 — End: 2018-09-22
  Administered 2018-09-21 – 2018-09-22 (×4): 10 mg via ORAL
  Filled 2018-09-21 (×4): qty 1

## 2018-09-21 MED ORDER — ALUMINUM-MAG HYDROXIDE-SIMETHICONE 200 MG-200 MG-20 MG/5 ML ORAL SUSP
30.0000 mL | ORAL | Status: DC | PRN
Start: 2018-09-21 — End: 2018-09-29

## 2018-09-21 MED ORDER — CLONIDINE HCL 0.1 MG TABLET
0.1000 mg | ORAL_TABLET | ORAL | Status: DC | PRN
Start: 2018-09-21 — End: 2018-09-29
  Administered 2018-09-24: 0.1 mg via ORAL
  Filled 2018-09-21: qty 1

## 2018-09-21 MED ORDER — DIAZEPAM 5 MG TABLET
5.0000 mg | ORAL_TABLET | Freq: Three times a day (TID) | ORAL | Status: AC
Start: 2018-09-24 — End: 2018-09-25
  Administered 2018-09-24 – 2018-09-25 (×3): 5 mg via ORAL
  Filled 2018-09-21 (×3): qty 1

## 2018-09-21 MED ORDER — LOPERAMIDE 2 MG CAPSULE
2.0000 mg | ORAL_CAPSULE | ORAL | Status: DC | PRN
Start: 2018-09-21 — End: 2018-09-29

## 2018-09-21 MED ORDER — LORAZEPAM 2 MG/ML INJECTION SOLUTION
2.0000 mg | Freq: Three times a day (TID) | INTRAMUSCULAR | Status: DC | PRN
Start: 2018-09-21 — End: 2018-09-29

## 2018-09-21 NOTE — ED Nurses Note (Signed)
Patient resting with eyes closed. Respirations even and unlabored. No s/s of distress . Will continue to monitor Hoover Brunette, RN  09/21/2018, 01:44

## 2018-09-21 NOTE — ED Nurses Note (Addendum)
Pt resting comfortably in bed. Does not verbalize any needs. Aware that we are awaiting etoh level to result. 7C consents faxed.

## 2018-09-21 NOTE — ED Nurses Note (Signed)
Patient resting in bed with eyes closed. Respirations even an unlabored. No s/s of distress. Will continue to monitor Hoover Brunette, RN  09/21/2018, 00:37

## 2018-09-21 NOTE — Care Plan (Signed)
[name] [name] has spent a quiet day at times sitting in the dining room with peers or resting in his bed.  Mood is quiet, cooperative; affect mildly restricted but responsive.  No SI, HI, or A/V Hallucinations have been voiced; no c/o voiced. He states that he feels tired and has needed "to rest". He has been compliant with unit activities and groups.  Continue to monitor and maintain safety checks.

## 2018-09-21 NOTE — Ancillary Notes (Signed)
Medical Nutrition Therapy Assessment  Protein-Calorie Malnutrition      SUBJECTIVE : Pt admitted for suicidal ideations and severe depression with alcohol use. Pt reported improving PO intake the last two weeks but said prior it had been poor for three months. Pt said three months ago he weighed 128 lbs and loses weight easily. Pt said he has been depressed and really stressed out and not eating as well. Pt said in the past he has used Ensure supplements to help him gain the weight back and requested strawberry TID with meals. Pt agreed to a fruit snack. Pt said he has digestive issues that have been ongoing since he was a baby. Pt said he thinks it might be IBS and he has diarrhea and constipation. Spoke with pt about IBS and diets that may help and encouraged pt to see a GI MD. Discussed how some pt's find relief if they avoid gluten, dairy and processed foods and sugars. Informed pt the first goal is weight gain and then he can try eliminating certain foods to see if that helps with digestion. Also discussed stress-relieving activities. Pt verbalized understanding and was thankful for the visit.     OBJECTIVE:   Pt is on Folvite, MVI.     Current Diet Order/Nutrition Support:  DIET REGULAR  MNT PROTOCOL FOR DIETITIAN     Height Used for Calculations: 172.7 cm (5' 7.99")  Weight Used For Calculations: 49.4 kg (109 lb)  BMI (kg/m2): 16.61     Ideal Body Weight (IBW) (kg): 70.87  % Ideal Body Weight: 69.76           Physical Assessment:   Pt exhibits characteristics of protein-calorie malnutrition including 19 lb/14.8% wt loss x 3 mo, moderate muscle mass loss, decreased PO intake for 3 mo, low BMI 16.58 kg. Pt with severe depression.     Estimated Needs:    Energy Calorie Requirements: 1715-1960 per day (35-40kcals/49kg)  Protein Requirements (gms/day): 49-64 g per day (1.0-1.3g/49kg)  Fluid Requirements: 1715 mL per day (55mLs/49kg)     Plan/Interventions :   1) Continue general healthy diet.   2) Add Ensure Plus  TID with meals.   3) Gave food recommendations for GI distress and encouraged pt to see GI MD.   4) Monitor wts/PO intake/labs. RD available as needed.    Nutrition Diagnosis:   Unspecified severe protein-calorie malnutrition related to: Social/Environmental as evidenced by: Energy Intake: Less than or equal to 50% of EEE in greater than or equal to 3 months, Weight Loss: greater than 7.5% in 3 months, Muscle Mass Loss: Mild, Moderate,  Other Factors: (Low BMI 16.58).    Othelia Pulling, [place]  09/21/2018, 17:42  [facility] Clinical Dietitian  Phone Ext. [phone]  Pager [phone]
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
## 2018-09-21 NOTE — Progress Notes (Signed)
[facility]  Nutrition Note    Patient Name: [name] [name]  MRN: [id number] / CSN: [id number]  Age: 38 y.o.  Date of Birth: 01/19/1981  Room/Bed: 7504/A  Attending Provider: Newell Coral, MD  Admit Date: 09/20/2018    RD Height: 172.7 cm (5' 7.99")  RD Weight: 49.4 kg (109 lb)    Upon nutrition assessment, the patient has evidence of the following:   Pt exhibits characteristics of protein-calorie malnutrition including 19 lb/14.8% wt loss x 3 mo, moderate muscle mass loss, decreased PO intake for 3 mo, low BMI 16.58 kg. Pt with severe depression.     Unspecified severe protein-calorie malnutrition    Related to: Social/Environmental     As evidenced by: Energy Intake: Less than or equal to 50% of EEE in greater than or equal to 3 months, Weight Loss: greater than 7.5% in 3 months, Muscle Mass Loss: Mild, Moderate,  Other Factors: (Low BMI 16.58) .    Plan of Care:   1) Continue general healthy diet.   2) Add Ensure Plus TID with meals.   3) Gave food recommendations for GI distress and encouraged pt to see GI MD.   4) Monitor wts/PO intake/labs. RD available as needed.    Othelia Pulling, [place]  09/21/2018, 17:45  [facility] Clinical Dietitian  Phone Ext. [phone]  Pager [phone]
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
## 2018-09-22 NOTE — Progress Notes (Signed)
_    [name] [name] NOTE    [name] [name]   Medical Record #: [id number]  Date of Birth: [birth year]  Date of Service: 09/22/2018      SUBJECTIVE:  Patient seen and examined. Treatment was discussed with staff. Patient reported no complaints.    OBJECTIVE:   Orientation: A and O X 3   Appearance:   pleasant and cooperative   Eye Contact:  good   Attention:  Adequate for conversation   Speech:  Regular rate and volume   Motor:  No movement abnormalities   Mood:  Happy and optimistic   Affect:  constricted   Thought Process:  Linear and goal directed   Thought Content:  No SI or HI. No delusions, preoccupations or phobias   Perception:  No auditory or visual hallucinations   Cognition:  With in normal limits   Insight:  fair   Judgement:  Fair    ASSESSMENT/PRINCIPAL PROBLEM AXIS I:  Active Hospital Problems    Diagnosis   . Substance abuse (CMS HCC)       PLAN: Continue supportive care; group and individual therapeutic interventions.     Medication changes: none  Discharge Planning: same        Netta Corrigan, MD    [facility]

## 2018-09-22 NOTE — Care Plan (Signed)
[place] NOTE    Marquee Fuchs   Medical Record #: [id number]  Date of Birth: 1980-10-29  Date of Service: 09/22/2018  Total time: 60 min.    CHIEF COMPLAINT:  Chief Complaint   Patient presents with    Panic Attack        SUBJECTIVE:  Completed psychosocial history assessment, BPRS, and reviewed safety plan. See psychosocial note and flowsheet for information, safety plan can be found on paper chart once completed. Will continue to monitor patient closely for thoughts of suicide, homicide and side effects of treatment.    ASSESSMENT:      ICD-10-CM    1. Anxiety F41.9    2. Suicidal ideations R45.851          PLAN: Will continue to monitor and support.     INTERDISCIPLINARY TREATMENT TEAM MEETING  PARTICIPANTS:  Medical Staff:   Nursing Staff: Marijean Bravo RN.  Therapy Staff: Orpah Melter MSW, Licensed Independent Holiday representative.  Case Management: Darrol Poke, RN.    Care plan reviewed with all participants above present.     DISPOSITION:   Discharge plan: Tentative plan is for patient to be discharged to friend's home on tbd. Patient will follow with tbd. See post discharge instructions for details.    Case management needs: aftercare/discharge planning       Markham Jordan, MSW, LICSW  Inpatient Therapist    09/22/2018  17:36    [facility]  [address]  [place], [place] [address]  [phone]

## 2018-09-22 NOTE — Care Plan (Signed)
Care plan updated.

## 2018-09-22 NOTE — Care Plan (Signed)
In back lounge watching TV with peers at start of shift- not observed interacting with peers. Offers little conversation to staff. Affect restricted. Mood depressed. Stated he had drank 12 beers over 2 day period prior to coming into hospital. Reported his ETOH consumption is "not often." Stated he drank 3x over past 6 months. Denies nervousness, shakiness; not tremulous. Vital signs WDL. Has appeared to be sleeping throughout night inbetween interruptions by staff for scheduled med administration. Close observation safety checks maintained.

## 2018-09-22 NOTE — Care Plan (Signed)
[name] [name] has been in his room for most of the day. He was compliant with medications and unit routine. Mood anxious. Affect blunted. He does not report any harmful thoughts to self or others at this time. He contracts for safety. Close observation maintained.

## 2018-09-22 NOTE — Group Note (Signed)
Group topic:  AGENDA GROUP    Date of group:  09/22/2018  Start time of group:  1130  End time of group:  1215               Summary of group discussion: Group discussed goals for their treatment, current issues, and provided feedback to one another regarding their concerns.       [name] [name]  is a 39 y.o. male participating in a agenda group.    Patient observations:  Reserved, discussed his reasons for admission    Patient goals:  Goal to restart MH treatment after being without for several months.    [name], CT  09/22/2018, 13:37

## 2018-09-23 LAB — URINE CULTURE,ROUTINE: URINE CULTURE: NO GROWTH
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
## 2018-09-23 NOTE — Care Plan (Signed)
[name] [name] has been quiet today. He was up on the unit for meals and for group therapy. He was compliant with medications and unit routine. He remains on a Valium taper and ETOH detox protocol. He does not report any harmful thoughts to self or others at this time. He contracts for safety. Close observation maintained.

## 2018-09-23 NOTE — Group Note (Signed)
Group topic:  AGENDA GROUP    Date of group:  09/23/2018  Start time of group:  1100  End time of group:  1200               Summary of group discussion:  Group discussed goals about changing negative thinking, supporting others, and maintaining positive relationships.       Samik Bogucki  is a 37 y.o. male participating in a agenda group.    Patient observations:  Quiet, shared experiences with other patients and provided feedback    Patient goals:  Goal to continue to improve    [name], CT  09/23/2018, 13:00

## 2018-09-23 NOTE — Care Plan (Signed)
Care plan updated.

## 2018-09-23 NOTE — Group Note (Signed)
Group topic:  PROCESS THERAPY    Date of group:  09/23/2018  Start time of group:  1610  End time of group:  1705               Summary of group discussion: Music therapy, patients chose meaningful songs, listened and discussed over the course of the session.       [name] [name]  is a 38 y.o. male participating in a process group.    Patient's mental status/affect:    Patient's behavior:    Patient's response:          [name], CT  09/23/2018, 17:30

## 2018-09-23 NOTE — Care Plan (Signed)
[name] [name] was awake, alert and oriented during initial nursing rounds.  He isolated himself during this shift.  He was compliant with HS medications.  Flat affect, mood depressed.  [name] [name] rested quietly throughout the night. Celine Ahr, RN

## 2018-09-23 NOTE — Progress Notes (Signed)
_    [place] NOTE    [name] [name]   Medical Record #: [id number]  Date of Birth: 1980/05/08  Date of Service: 09/23/2018      SUBJECTIVE:  Patient seen and examined. Treatment was discussed with staff. Patient slept well.    OBJECTIVE:   Orientation: A and O X 3   Appearance:   pleasant and cooperative   Eye Contact:  good   Attention:  Adequate for conversation   Speech:  Regular rate and volume   Motor:  No movement abnormalities   Mood:  Feels better and optimistic   Affect:  constricted   Thought Process:  Linear and goal directed   Thought Content:  No SI or HI. No delusions, preoccupations or phobias   Perception:  No auditory or visual hallucinations   Cognition:  With in normal limits   Insight:  fair   Judgement:  Fair    ASSESSMENT/PRINCIPAL PROBLEM AXIS I:  Active Hospital Problems    Diagnosis   . Substance abuse (CMS HCC)       PLAN: Continue supportive care; group and individual therapeutic interventions.     Medication changes: none\    Discharge Planning: same        Netta Corrigan, MD    [place] [facility]

## 2018-09-23 NOTE — Care Plan (Signed)
[place] NOTE    [name]   Medical Record #: [id number]  Date of Birth: Jan 06, 1981  Date of Service: 09/23/2018  Total time: 0 min.    CHIEF COMPLAINT:  Chief Complaint   Patient presents with   . Panic Attack        DATA:   [name] [name] did not participate or request individual therapy on this date, was active in the milieu, and attended 2 groups. Will continue to monitor patient closely for thoughts of suicide, homicide and side effects of treatment.    ASSESSMENT:      ICD-10-CM    1. Anxiety F41.9    2. Suicidal ideations R45.851        PLAN: Will continue to monitor and support.       Interdisciplinary treatment team meeting documentation    PARTICIPANTS:  Medical Staff:   Nursing Staff: Marijean Bravo RN.  Therapy Staff: Orpah Melter MSW, Licensed Independent Holiday representative.    Care plan reviewed with all participants above present.     DISPOSITION:   Discharge plan: Tentative plan is for patient to be discharged to friend's home on tbd. Patient will follow with tbd. See post discharge instructions for details.    Case management needs: aftercare/discharge planning       [name] [name], MSW, LICSW  Inpatient Therapist    09/23/2018  17:36    [facility]  [address]  [place], [place] [address]  [phone]

## 2018-09-24 ENCOUNTER — Other Ambulatory Visit: Payer: Self-pay

## 2018-09-24 NOTE — Care Plan (Signed)
Problem: Adult Behavioral Health Plan of Care  Goal: Plan of Care Review  Outcome: Ongoing (see interventions/notes)  Flowsheets (Taken 09/24/2018 1615)  Patient Agreement with Plan of Care: agrees  Plan of Care Reviewed With: patient  Note: Met this date with patient to complete plan of care review.  Patient agrees to work on goals/objectives associated with plan of care. Will continue to monitor/assess, next review will be on 09/28/2018.       Problem: Adult Behavioral Health Plan of Care  Goal: Patient-Specific Goal (Individualization)  Outcome: Ongoing (see interventions/notes)  Flowsheets (Taken 09/22/2018 1734 by Markham Jordan I, CT)  Patient Personal Strengths:   family/social support   positive educational history   self-awareness   self-reliant   positive vocational history   realistic evaluation of current/future capabilities   resilient   resourceful   motivated for treatment   independent living skills   expressive of needs  Patient Vulnerabilities:   Chronic mental illness that interferes with level of adaptive function   Unresolved trauma history   Poor social supports   Poor coping skills   Homeless     Problem: Adult [place] of Care  Goal: Rounds/Family Conference  Outcome: Ongoing (see interventions/notes)  Flowsheets (Taken 09/24/2018 1615)  Participants:   attending physician   case manager   clinical therapist   RN  Note: [place]  Interdisciplinary treatment team meeting documentation    PATIENT: [place] #: [id number]  DATE OF BIRTH:[birth year]  DATE OF SERVICE:09/24/2018  TIME OF SERVICE: 16:17    PARTICIPANTS:  Medical Staff: Newell Coral MD, Psychiatrist.  Nursing Staff: Raeanne Gathers RN.  Therapy Staff: Magnus Ivan MS, Licensed Professional Counselor and Orpah Melter MSW, Licensed Independent Holiday representative.  Case Management: Darrol Poke, RN.    Care plan reviewed with all participants present. All  attendees in agreement, unless noted below.    DISPOSITION:   Discharge plan: Tentative plan is for patient to be discharged to tbd on tbd. Patient will follow with tbd. See post discharge instructions for details.    Case management needs: tbd      Magnus Ivan MS, [facility], CRC  Lead Therapist  [facility], [facility]  [address]  [place], [place] [address]  [phone]

## 2018-09-24 NOTE — Care Plan (Signed)
Jusitn was awake, alert and oriented.  Restricted affect, mood depressed.  He denied any c/o harmful thoughts.  He was compliant with HS medications.  He denied any sensory hallucinations. Celine Ahr, RN
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
## 2018-09-24 NOTE — Care Plan (Signed)
Up on unit this shift. Attending meals. Compliant with medication. Socializing appropriately with select peers. Medicated for increased BP. Flat affect. No thoughts of harm to self or others voiced. Safety checks maintained for patient safety.

## 2018-09-24 NOTE — Group Note (Signed)
Group topic:  PROCESS THERAPY    Date of group:  09/24/2018  Start time of group:  1110  End time of group:  1210               Summary of group discussion: Group continued music therapy group from the previous evening.       [name] [name]  is a 38 y.o. male participating in a process group.    Patient's mental status/affect:  Restricted    Patient's behavior:  Calm, withdrawn    Patient's response:  Quiet through group, enjoyed activity        [name], CT  09/24/2018, 12:54

## 2018-09-25 NOTE — Care Plan (Signed)
Up on unit during evening. Watched TV with peers- minimal interaction observed. Affect flat. Mood anxious & admitted feeling anxious. Vital signs WDL. Has expressed no thoughts of self harm or harm toward others. No evidence hallucinatory processes. Prn Ativan given for anxiety. Compliant with scheduled meds. Sleeping throughout night when observed during close observation safety checks.

## 2018-09-25 NOTE — Progress Notes (Signed)
_    [name] NOTE    [name] [name]   Medical Record #: [id number]  Date of Birth: Nov 16, 1980  Date of Service: 09/25/2018      SUBJECTIVE:  Patient seen and examined. Treatment was discussed with staff. Patient states that the medication as well as the counseling seems to be helping him.  He is hoping to have a plan to join some outpatient counseling.  Is not interested in going into long-term rehab.    OBJECTIVE:   Orientation: A and O X 3   Appearance:   pleasant and cooperative   Eye Contact:  good   Attention:  Adequate for conversation   Speech:  Regular rate and volume   Motor:  No movement abnormalities   Mood:  Feels better and optimistic   Affect:  constricted   Thought Process:  Linear and goal directed   Thought Content:  No SI or HI. 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     Active Hospital Problems    Diagnosis   . Substance abuse (CMS HCC)       PLAN: Continue supportive care; group and individual therapeutic interventions.   Continue with the detox  Medication changes: none\    Discharge Planning: 09/27/2018    [name] [name], [place] [facility]

## 2018-09-25 NOTE — Care Plan (Signed)
Up on unit this shift. Attending meals. Monitoring for s/s of w/d. Valium taper continues. Nonsocial with peers. Anxious. No thoughts of harm to self or others voiced. Safety checks maintained for patient safety.

## 2018-09-25 NOTE — Care Plan (Signed)
Problem: Adult Behavioral Health Plan of Care  Goal: Patient-Specific Goal (Individualization)  Outcome: Ongoing (see interventions/notes)  Flowsheets (Taken 09/22/2018 1734 by [name], CT)  Patient Personal Strengths:   family/social support   positive educational history   self-awareness   self-reliant   positive vocational history   realistic evaluation of current/future capabilities   resilient   resourceful   motivated for treatment   independent living skills   expressive of needs  Patient Vulnerabilities:   Chronic mental illness that interferes with level of adaptive function   Unresolved trauma history   Poor social supports   Poor coping skills   Homeless     Problem: Adult Behavioral Health Plan of Care  Goal: Rounds/Family Conference  Outcome: Ongoing (see interventions/notes)  Flowsheets (Taken 09/25/2018 1213)  Participants:   clinical therapist   RN  Note: [place]  Interdisciplinary treatment team meeting documentation    PATIENT: [name] [name] RECORD #: [id number]  DATE OF BIRTH:1980/10/30  DATE OF SERVICE:09/25/2018  TIME OF SERVICE: 12:14    PARTICIPANTS:    Nursing Staff: Raeanne Gathers RN.  Therapy Staff: Magnus Ivan MS, Licensed Professional Counselor.    Care plan reviewed with all participants present. All attendees in agreement, unless noted below.    DISPOSITION:   Discharge plan: Tentative plan is for patient to be discharged to friends home on tbd. Patient will follow with tbd. See post discharge instructions for details.    Case management needs: tbd      Magnus Ivan MS, [facility], CRC  Lead Therapist  [facility], [facility]  [address]  [place], [place] [address]  [phone]

## 2018-09-25 NOTE — Group Note (Signed)
Group topic:  SKILLS GROUP    Date of group:  09/25/2018  Start time of group:  1610  End time of group:  1710               Summary of group discussion: Group reviewed REBT principles and used ABC format to reorganize thoughts/reactions regarding volunteered dilemmas.      [name] [name]  is a 38 y.o. male participating in a skills group.    Patient observations:      Patient goals:      [name], CT  09/25/2018, 17:24

## 2018-09-26 NOTE — Progress Notes (Signed)
_    [name] [name]    [name] [name]   Medical Record #: [id number]  Date of Birth: 1980-08-05  Date of Service: 09/26/2018      SUBJECTIVE:  Patient seen and examined. Treatment was discussed with staff. Patient states that he is willing to consider the long-term rehab program now.   OBJECTIVE:   Orientation: A and O X 3   Appearance:   pleasant and cooperative   Eye Contact:  good   Attention:  Adequate for conversation   Speech:  Regular rate and volume   Motor:  No movement abnormalities   Mood:  Feels better and optimistic   Affect:  constricted   Thought Process:  Linear and goal directed   Thought Content:  No SI or HI. 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     Active Hospital Problems    Diagnosis   . Substance abuse (CMS HCC)       PLAN: Continue supportive care; group and individual therapeutic interventions.  Will find out all the details about the acceptance into her rehab program since the patient's insurance is from [place].  Medication changes: none\    Discharge Planning: 09/27/2018    [name] [name], [facility]

## 2018-09-26 NOTE — Care Plan (Signed)
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  Therapy Staff: Magnus Ivan MS, Licensed Professional Counselor.    Care plan reviewed with all participants present. All attendees in agreement, unless noted below.    DISPOSITION:   Discharge plan: Tentative plan is for patient to be discharged to tbd on tbd. Patient will follow with tbd. See post discharge instructions for details.    Case management needs: tbd      Magnus Ivan MS, [facility], CRC  Lead Therapist  [facility], [facility]  [address]  [place], [place] [address]  [phone]         Problem: Adult Treatment Plan  Goal: Patient will meet individually daily with Clinical Therapist.  Outcome: Ongoing (see interventions/notes)  Flowsheets (Taken 09/26/2018 1542)  Patient met individually  daily with Clinical Therapist.: Yes  Note: [place] Record #: [id number]  Date of Birth: [birth year]  Date of Service: 09/26/2018      CHIEF COMPLAINT:  Chief Complaint   Patient presents with    Panic Attack        SUBJECTIVE:  Patient reported that he is thinking about possibly going to residential rehab now.  After discussing this with [facility] CES, she provided him with a list of places to call to check about availability as he has [employer].  Will continue to monitor/assess, as yesterday he had a plan to go live with a friend in [place].      OBJECTIVE:  Orientation: person, place, time and situation  Appearance: casually dressed  Mood: anxious and depressed.    Affect: congruent to mood.    Thought process: goal directed/coherent  Insight: fair  Judgment: fair  Current Suicidal Thoughts: No    ASSESSMENT:      ICD-10-CM    1. Anxiety F41.9    2. Suicidal ideations R45.851          PLAN: Obtained update from patient. Will continue to monitor/assess. Will begin working on safety plan on 09/27/2018.     Homework: provided safety plan to work on .     Magnus Ivan MS, [facility], [facility]  Inpatient Therapist    09/26/2018  15:43    [facility]  [address]  [place], [place] [address]  [phone]

## 2018-09-26 NOTE — Care Plan (Signed)
Patient was seen in the lounge during initial nursing rounds. He has been active in the milieu. He reports that he has [place]. He requests rehab, and the case manager reports that he will have to get a rehab center in [place]. He has also said that he wants to be discharged to [place] or [place]. Safety rounds continue.

## 2018-09-26 NOTE — Care Plan (Signed)
Up on unit during evening. Talked on hall phone. Not observed interacting with peers. Reported feeling "a lot better." Pleasant. Affect restricted. Mood anxious. Has expressed no thoughts of self harm or harm toward others. BP 110/77, Pulse 109/minute. Med compliant. Has appeared to be sleeping throughout night except when interrupted for scheduled Valium. Close observation safety checks maintained.

## 2018-09-27 NOTE — Care Plan (Signed)
[place] was awake, alert and oriented during initial nursing rounds.  Quiet mood, and blunted affect.  Babyboy denied any c/o harmful thoughts.  He rested quietly throughout the night. Celine Ahr, RN

## 2018-09-27 NOTE — Care Plan (Addendum)
Patient was seen in the lounge with peers this morning. He was watching television with peers. His affect continues to be blunted. He has been pleasant and calm. His heart rate tends to be fast during the day with the first reading of the day being within defined parameters. He is scheduled to be discharged 7/24.  He reports that he found and contacted a dual diagnosis facility in [place]. He reports that the facility is to fax referral information to this facility. He reports that the facility has informed that there is room in facility for him and they are accepting patients.Will continue to monitor. Safety rounds continue.

## 2018-09-27 NOTE — Care Plan (Signed)
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  Therapy Staff: Magnus Ivan MS, Licensed Professional Counselor.    Care plan reviewed with all participants present. All attendees in agreement, unless noted below.    DISPOSITION:   Discharge plan: Tentative plan is for patient to be discharged to tbd on tbd. Patient will follow with tbd. See post discharge instructions for details.    Case management needs: referral to rehab in [place], [place], [place]  [address]  [place], [place] [address]  [phone]         Problem: Adult Treatment Plan  Goal: Patient will meet individually daily with Clinical Therapist.  Outcome: Ongoing (see interventions/notes)  Flowsheets (Taken 09/27/2018  1222)  Patient met individually daily with Clinical Therapist.: Yes  Note: [name] [name] Record #: [id number]  Date of Birth: [birth year]  Date of Service: 09/27/2018    Total time: 59    CHIEF COMPLAINT:  Chief Complaint   Patient presents with    Panic Attack        SUBJECTIVE:  Patient reported that he is feeling better, stating that he has spoken to a potential rehab in [place], [place] and will need to have a referral sent there.  Spoke with [facility] CES who reported that she will send the referral for him as he stated that it is important for him to address both his alcohol and his PTSD/Anxiety issues in a dual unit setting, as only focusing on alcohol would be a mistake.  He has been taking an active/attentive role with his treatment.  Will continue to monitor/assess.     OBJECTIVE:  Orientation: person, place, time and situation  Appearance: casually dressed  Mood: anxious and depressed.    Affect: congruent to mood.    Thought process: goal directed/coherent  Insight: fair  Judgment: fair  Current Suicidal Thoughts: No    ASSESSMENT:      ICD-10-CM    1. Anxiety F41.9    2. Suicidal ideations R45.851          PLAN: Obtained update from patient. Will continue to monitor/assess.     Homework: work on Public affairs consultant  MS, [facility], [facility]  Inpatient Therapist    09/27/2018  12:24    [facility]  [address]  [place], [place] [address]  [phone]

## 2018-09-27 NOTE — Ancillary Notes (Signed)
Medical Nutrition Therapy Assessment  Protein-Calorie Malnutrition      SUBJECTIVE : Pt admitted for suicidal ideations and severe depression with alcohol use. Spoke with nurse, Crystal, and requested a new weight for pt. She reported new wt of 112 lbs (wt gain of 3 lbs) and said pt is drinking his supplements. PO Intake per flowsheets is good. Will continue with current plan of care and monitor pt.     OBJECTIVE:   Pt is on Folvite, MVI.     Current Diet Order/Nutrition Support:  DIET REGULAR  MNT PROTOCOL FOR DIETITIAN  DIETARY ORAL SUPPLEMENTS Supplement: Ensure Plus Strawberry; BREAKFAST/LUNCH/DINNER; 1 Can     Height Used for Calculations: 172.7 cm (5' 7.99")  Weight Used For Calculations: 50.8 kg (112 lb)  BMI (kg/m2): 16.61     Ideal Body Weight (IBW) (kg): 70.87  % Ideal Body Weight: 69.76            09/27/18 1050 50.8 kg (112 lb) -- 2.8 % JM   09/21/18 0906 49.4 kg (109 lb) -- 0 % MD   09/21/18 0754 49.4 kg (109 lb) -- -2.3 % EL     Physical Assessment:   Pt exhibits characteristics of protein-calorie malnutrition including 19 lb/14.8% wt loss x 3 mo, moderate muscle mass loss, decreased PO intake for 3 mo, low BMI 16.58 kg. Pt with severe depression.     Estimated Needs:    Energy Calorie Requirements: 1715-1960 per day (35-40kcals/49kg)  Protein Requirements (gms/day): 49-64 g per day (1.0-1.3g/49kg)  Fluid Requirements: 1715 mL per day (68mLs/49kg)     Plan/Interventions :   1) Continue general healthy diet.   2) Continue Ensure Plus TID with meals.   3) Monitor wts/PO intake/labs. RD available as needed.    Nutrition Diagnosis:   Unspecified severe protein-calorie malnutrition related to: Social/Environmental as evidenced by: Energy Intake: Less than or equal to 50% of EEE in greater than or equal to 3 months, Weight Loss: greater than 7.5% in 3 months, Muscle Mass Loss: Mild, Moderate,  Other Factors: (Low BMI 16.58).    Othelia Pulling, [place]  09/27/2018, 11:19  [facility] Clinical Dietitian  Phone Ext.  [phone]  Pager [phone]

## 2018-09-28 MED ORDER — FOLIC ACID 1 MG TABLET
1.0000 mg | ORAL_TABLET | Freq: Every day | ORAL | 0 refills | Status: AC
Start: 2018-09-29 — End: 2018-10-29

## 2018-09-28 NOTE — Care Plan (Signed)
Care plan updated.

## 2018-09-28 NOTE — Care Plan (Signed)
[name] [name] was awake, alert and oriented during initial nursing rounds. He was pleasant and cooperative.  [name] [name] denied any c/o harmful thoughts. Celine Ahr, RN

## 2018-09-28 NOTE — Discharge Instructions (Signed)
Patient to contact [facility] [phone] for bed availability.

## 2018-09-28 NOTE — Care Plan (Signed)
[name] [name] has been up on the unit today. He was scheduled to be discharged. He stated that he had nowhere to go. [facility] has no beds at this time, and their warm room is closed. Dr. Alferd Patee notified and orders obtained to hold discharge for today. He does not report any harmful thoughts to self or others. He contracted for safety. Close observation maintained.

## 2018-09-28 NOTE — Group Note (Signed)
Group topic:  AGENDA GROUP    Date of group:  09/28/2018  Start time of group:  1030  End time of group:  1115               Summary of group discussion: Group discussed daily goals, family court procedures, and loss of parents.       [name] [name]  is a 38 y.o. male participating in a agenda group.    Patient observations:  Engaged in group discussion, provided feedback and information to others.    Patient goals:  Goal to find a place to live, prior to getting to rehab that he believes will accept him in 1-2 weeks.     [name], CT  09/28/2018, 13:23

## 2018-09-29 NOTE — Nurses Notes (Signed)
Mustapha will be discharged to home today. His mother is coming to pick him up, and take him back to [place] with her. All belongings returned to pt. Prescription given to pt. He does not report any harmful thoughts to self or others at this time. No signs of psychosis noted. He will be discharged ambulatory from the unit with all his belongings.

## 2018-09-29 NOTE — Care Plan (Signed)
[name] [name] was out on the unit walking the hallways with [name] [name] upon initial nrsg rounds, stated doing ok when asked, appears social with others, offers some conversation when spoken to, is pleasant and cooperative, was out for a snack, no HI or SI voiced, compliant with meds and tx plan, safety checks maintained, appeared to have slept well.
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
